# Patient Record
Sex: Female | Born: 1940 | Race: White | Hispanic: No | Smoking: Never smoker
Health system: Southern US, Community
[De-identification: ages and names within clinical notes are randomized; demographics above are authoritative.]

## PROBLEM LIST (undated history)

## (undated) DIAGNOSIS — K219 Gastro-esophageal reflux disease without esophagitis: Secondary | ICD-10-CM

## (undated) DIAGNOSIS — F209 Schizophrenia, unspecified: Secondary | ICD-10-CM

## (undated) DIAGNOSIS — I2510 Atherosclerotic heart disease of native coronary artery without angina pectoris: Secondary | ICD-10-CM

## (undated) DIAGNOSIS — F0390 Unspecified dementia without behavioral disturbance: Secondary | ICD-10-CM

## (undated) DIAGNOSIS — I10 Essential (primary) hypertension: Secondary | ICD-10-CM

## (undated) DIAGNOSIS — E039 Hypothyroidism, unspecified: Secondary | ICD-10-CM

## (undated) DIAGNOSIS — E876 Hypokalemia: Secondary | ICD-10-CM

## (undated) DIAGNOSIS — S72009A Fracture of unspecified part of neck of unspecified femur, initial encounter for closed fracture: Secondary | ICD-10-CM

## (undated) DIAGNOSIS — I4891 Unspecified atrial fibrillation: Secondary | ICD-10-CM

## (undated) DIAGNOSIS — F0280 Dementia in other diseases classified elsewhere without behavioral disturbance: Secondary | ICD-10-CM

## (undated) DIAGNOSIS — E079 Disorder of thyroid, unspecified: Secondary | ICD-10-CM

## (undated) DIAGNOSIS — E119 Type 2 diabetes mellitus without complications: Secondary | ICD-10-CM

## (undated) DIAGNOSIS — G309 Alzheimer's disease, unspecified: Secondary | ICD-10-CM

## (undated) HISTORY — PX: ABDOMINAL HYSTERECTOMY: SHX81

---

## 2004-08-04 ENCOUNTER — Ambulatory Visit: Payer: Self-pay

## 2004-11-11 ENCOUNTER — Ambulatory Visit: Payer: Self-pay | Admitting: Internal Medicine

## 2005-02-11 ENCOUNTER — Ambulatory Visit: Payer: Self-pay | Admitting: Internal Medicine

## 2006-02-15 ENCOUNTER — Ambulatory Visit: Payer: Self-pay | Admitting: Internal Medicine

## 2006-08-09 ENCOUNTER — Ambulatory Visit: Payer: Self-pay | Admitting: Internal Medicine

## 2006-11-09 ENCOUNTER — Ambulatory Visit: Payer: Self-pay | Admitting: Internal Medicine

## 2007-02-17 ENCOUNTER — Ambulatory Visit: Payer: Self-pay | Admitting: Internal Medicine

## 2007-09-02 ENCOUNTER — Ambulatory Visit: Payer: Self-pay | Admitting: Internal Medicine

## 2007-11-24 ENCOUNTER — Ambulatory Visit: Payer: Self-pay | Admitting: Internal Medicine

## 2008-01-05 ENCOUNTER — Ambulatory Visit: Payer: Self-pay | Admitting: Internal Medicine

## 2008-02-20 ENCOUNTER — Ambulatory Visit: Payer: Self-pay | Admitting: Internal Medicine

## 2008-11-14 ENCOUNTER — Ambulatory Visit: Payer: Self-pay | Admitting: Internal Medicine

## 2009-02-21 ENCOUNTER — Ambulatory Visit: Payer: Self-pay | Admitting: Internal Medicine

## 2009-11-22 ENCOUNTER — Ambulatory Visit: Payer: Self-pay | Admitting: Cardiology

## 2010-04-24 ENCOUNTER — Emergency Department: Payer: Self-pay | Admitting: Emergency Medicine

## 2010-06-25 ENCOUNTER — Inpatient Hospital Stay: Payer: Self-pay | Admitting: Psychiatry

## 2010-06-26 ENCOUNTER — Observation Stay: Payer: Self-pay | Admitting: Internal Medicine

## 2010-06-27 ENCOUNTER — Inpatient Hospital Stay: Payer: Self-pay | Admitting: Psychiatry

## 2011-02-09 ENCOUNTER — Ambulatory Visit: Payer: Self-pay | Admitting: Internal Medicine

## 2011-11-26 ENCOUNTER — Ambulatory Visit: Payer: Self-pay | Admitting: Internal Medicine

## 2012-11-29 ENCOUNTER — Ambulatory Visit: Payer: Self-pay | Admitting: Internal Medicine

## 2013-11-30 ENCOUNTER — Ambulatory Visit: Payer: Self-pay | Admitting: Internal Medicine

## 2014-01-30 ENCOUNTER — Ambulatory Visit: Payer: Self-pay | Admitting: Ophthalmology

## 2014-01-30 LAB — POTASSIUM: Potassium: 3.8 mmol/L (ref 3.5–5.1)

## 2014-02-06 ENCOUNTER — Ambulatory Visit: Payer: Self-pay | Admitting: Ophthalmology

## 2014-02-20 ENCOUNTER — Ambulatory Visit: Payer: Self-pay | Admitting: Ophthalmology

## 2014-04-17 ENCOUNTER — Ambulatory Visit: Payer: Self-pay | Admitting: Internal Medicine

## 2014-07-17 ENCOUNTER — Ambulatory Visit: Payer: Self-pay | Admitting: Internal Medicine

## 2014-08-22 ENCOUNTER — Inpatient Hospital Stay: Payer: Self-pay | Admitting: Psychiatry

## 2014-10-27 NOTE — Op Note (Signed)
PATIENT NAME:  [name], Shawneequa M MR#:  [id number] DATE OF BIRTH:  07/14/1940  DATE OF PROCEDURE:  02/06/2014  PREOPERATIVE DIAGNOSIS: Visually significant cataract of the right eye.   POSTOPERATIVE DIAGNOSIS: Visually significant cataract of the right eye.   OPERATIVE PROCEDURE: Cataract extraction by phacoemulsification with implant of intraocular lens to right eye.   SURGEON: Galen ManilaWilliam Crecencio Kwiatek, MD.   ANESTHESIA:  1. Managed anesthesia care.  2. Topical tetracaine drops followed by 2% Xylocaine jelly applied in the preoperative holding area.   COMPLICATIONS: None.   TECHNIQUE:  Stop and chop.   DESCRIPTION OF PROCEDURE: The patient was examined and consented in the preoperative holding area where the aforementioned topical anesthesia was applied to the right eye and then brought back to the Operating Room where the right eye was prepped and draped in the usual sterile ophthalmic fashion and a lid speculum was placed. A paracentesis was created with the side port blade and the anterior chamber was filled with viscoelastic. A near clear corneal incision was performed with the steel keratome. A continuous curvilinear capsulorrhexis was performed with a cystotome followed by the capsulorrhexis forceps. Hydrodissection and hydrodelineation were carried out with BSS on a blunt cannula. The lens was removed in a stop and chop technique and the remaining cortical material was removed with the irrigation-aspiration handpiece. The capsular bag was inflated with viscoelastic and the Tecnis ZCB00 22.5-diopter lens, serial number [id number] was placed in the capsular bag without complication. The remaining viscoelastic was removed from the eye with the irrigation-aspiration handpiece. The wounds were hydrated. The anterior chamber was flushed with Miostat and the eye was inflated to physiologic pressure. Cefuroxime was not placed within the eye due to penicillin allergy and rather a 4:1 dilution of Vigamox was  placed in the eye at the end of the case. The wounds were found to be water tight. The eye was dressed with Vigamox. The patient was given protective glasses to wear throughout the day and a shield with which to sleep tonight. The patient was also given drops with which to begin a drop regimen today and will follow-up with me in one day.    ____________________________ Jerilee FieldWilliam L. Mustafa Potts, MD wlp:jh D: 02/06/2014 22:53:06 ET T: 02/07/2014 05:44:04 ET JOB#: [id number]  cc: Jordie Schreur L. Rook Maue, MD, <Dictator> Jerilee FieldWILLIAM L Triton Heidrich MD ELECTRONICALLY SIGNED 02/07/2014 16:34

## 2014-10-27 NOTE — Op Note (Signed)
PATIENT NAME:  [name], Danielle Boyer MR#:  [id number] DATE OF BIRTH:  01-01-1941  DATE OF PROCEDURE:  02/20/2014  LENS IMPLANT:  Tecnis ZCB00 with 23.0 diopter lens, serial #[id number].   PREOPERATIVE DIAGNOSES:  Visually significant cataract of the left eye.   POSTOPERATIVE DIAGNOSES:  Visually significant cataract of the left eye.   OPERATIVE PROCEDURE:  Cataract extraction by phacoemulsification with implant of intraocular lens to left eye.   SURGEON:  Galen ManilaWilliam Anasofia Micallef, MD.   ANESTHESIA:  1. Managed anesthesia care.  2. Topical tetracaine drops followed by 2% Xylocaine jelly applied in the preoperative holding area.   COMPLICATIONS:  None.   TECHNIQUE:  Stop and chop.   DESCRIPTION OF PROCEDURE:  The patient was examined and consented in the preoperative holding area where the aforementioned topical anesthesia was applied to the left eye and then brought back to the Operating Room where the left eye was prepped and draped in the usual sterile ophthalmic fashion and a lid speculum was placed. A paracentesis was created with the side port blade and the anterior chamber was filled with viscoelastic. A near clear corneal incision was performed with the steel keratome. A continuous curvilinear capsulorrhexis was performed with a cystotome followed by the capsulorrhexis forceps. Hydrodissection and hydrodelineation were carried out with BSS on a blunt cannula. The lens was removed in a stop and chop technique and the remaining cortical material was removed with the irrigation-aspiration handpiece. The capsular bag was inflated with viscoelastic and the Tecnis ZCB00 with 23.0 diopter lens, serial number [id number] was placed in the capsular bag without complication. The remaining viscoelastic was removed from the eye with the irrigation-aspiration handpiece. The wounds were hydrated. The anterior chamber was flushed with Miostat and the eye was inflated to physiologic pressure. Please note that cefuroxime was  not placed within the eye due to PENICILLIN ALLERGY, rather a 4 to 1 dilution of Vigamox was placed in the anterior chamber. The wounds were found to be water tight. The eye was dressed with Vigamox. The patient was given protective glasses to wear throughout the day and a shield with which to sleep tonight. The patient was also given drops with which to begin a drop regimen today and will follow-up with me in one day.    ____________________________ Jerilee FieldWilliam L. Geremiah Fussell, MD wlp:nt D: 02/20/2014 20:31:51 ET T: 02/20/2014 21:58:16 ET JOB#: [id number]  cc: Saidi Santacroce L. Keenon Leitzel, MD, <Dictator> Jerilee FieldWILLIAM L Tangy Drozdowski MD ELECTRONICALLY SIGNED 02/21/2014 8:51

## 2014-11-04 NOTE — H&P (Signed)
PATIENT NAME:  Danielle Boyer, Danielle Boyer MR#:  [id number] DATE OF BIRTH:  [birth year]  DATE OF ADMISSION:  08/22/2014  REFERRING PHYSICIAN: [facility].   ATTENDING PHYSICIAN: Taytem Ghattas B. Jennet Maduro, MD   IDENTIFYING DATA: Danielle Boyer is a 74 year old female with history of psychosis.   CHIEF COMPLAINT: "I hear voices."   HISTORY OF PRESENT ILLNESS: Danielle Boyer was hospitalized at [facility] in December 2011 for a psychotic episode involving auditory hallucinations, paranoia, and bizarre behavior. She was discharged on a combination of Depakote and Haldol. She has been seeing Dr. Jennet Maduro at [facility] since her discharge in 2011. The patient has been stable on an antipsychotic; however, her Depakote had to be discontinued due to severe hair loss. The patient should be taking 5 mg of Haldol twice daily but is no longer on the list of her medications. The patient arrived at my office today with 3 of her adult children complaining of auditory hallucinations. She hears 3 voices in her head, including 1 female voice. She believes that they speak to her from the attic. They run commentary on her behavior, "Danielle Boyer you are now going to bed; what are you doing on the toilet." They do not  commend. They are not threatening or frightening, but the patient has been rather upset about it. In the past, she was accusing her neighbors of making noises. This time around she is just frightened around the house and continuously calls 911. They initially were coming to the house, but they refused to do so any more. Yesterday morning the patient was so frightened that she eventually refused to go back to the house. She came to my office. She agreed to hospitalization. She remembered that her symptoms now are very much similar to the symptoms in 2011. She denies any symptoms of depression. She denies heightened anxiety. That there is no alcohol, illicit substance or  prescription pill misuse involved.   PAST PSYCHIATRIC HISTORY: As above, she has 1 previous hospitalization. She stayed in the hospital for almost 3 weeks for similar symptoms. She was treated with Risperdal, Haldol, Depakote, and Zyprexa. There are no suicide attempts. No history of substance use.   FAMILY PSYCHIATRIC HISTORY: With depression and anxiety. Her sister is on Xanax.   MEDICATIONS: The patient is a patient of Dr. Judithann Sheen and is prescribed aspirin 81 mg daily, Depakote 125 mg twice daily, this was started by Dr. Malvin Johns, a neurologist, Colace 100 mg twice daily, furosemide 20 mg daily, insulin NovoLog 70/30 with 40 units in the morning and 30 at bedtime, Imdur 60 mg daily, Synthroid 50 mcg daily, metformin 1000 mg twice daily, metoprolol 50 mg twice daily, MiraLax 17 grams as needed for constipation, Valtrex 500 mg daily, Ranexa 500 mg twice daily, pantoprazole 40 mg daily, she should be taking Haldol 5 mg twice daily as well.   SOCIAL HISTORY: She is widowed. She lives by herself with a dog. She believes that the dog is the only other being that is able to hear the voices as reportedly the dog barks when the patient hears the voices. She has a very supportive family. Her younger son, Danielle Boyer, who is the healthcare power of attorney, spends weekends with her. She often goes to the house of Danielle Boyer, her older son, but she refuses to stay there for the night even though the family made arrangements for that. She also has a daughter, Danielle Boyer, who has been bringing the patient to all of her  appointments since 2011. She has Medicare and Medicaid. The patient all her life she has been either married or in a relationship and it has been only a couple of years when she does not have a female partner in the house. She still drives a car and visits her son's bakery in [place] almost daily.   REVIEW OF SYSTEMS: CONSTITUTIONAL: No fevers or chills. No weight changes.  EYES: No double or blurred vision.  ENT: No  hearing loss.  RESPIRATORY: No shortness of breath or cough.  CARDIOVASCULAR: No chest pain or orthopnea.  GASTROINTESTINAL: No abdominal pain, nausea, vomiting, or diarrhea.  GENITOURINARY: No incontinence or frequency.  ENDOCRINE: No heat or cold intolerance.  LYMPHATIC: No anemia or easy bruising.  INTEGUMENTARY: No acne or rash.  MUSCULOSKELETAL: No muscle or joint pain.  NEUROLOGIC: No tingling or weakness.  PSYCHIATRIC: See history of present illness for details.   PHYSICAL EXAMINATION: VITAL SIGNS: Blood pressure 138/84, pulse 76, respirations 20, temperature 97.7.  GENERAL: This is a well-developed elderly female in no acute distress.  HEENT: The pupils are equal, round, and reactive to light. Sclerae anicteric.  NECK: Supple. No thyromegaly.  LUNGS: Clear to auscultation. No dullness to percussion.  HEART: Regular rhythm and rate. No murmurs, rubs, or gallops.  ABDOMEN: Soft, nontender, nondistended. Positive bowel sounds.  MUSCULOSKELETAL: Normal muscle strength in all extremities.  SKIN: No rashes or bruises.  LYMPHATIC: No cervical adenopathy.  NEUROLOGIC: Cranial nerves II through XII are intact.   LABORATORY DATA: Chemistries are within normal limits. A lipid profile is within normal limits except for low HDL of 39. Hemoglobin A1c 6.5. Liver function tests within normal limits. TSH 2.61. Urine toxicology screen negative for substances. CBC within normal limits. Urinalysis: Leukocyte esterase 1+ with 8 cells per field.   MENTAL STATUS EXAMINATION ON ADMISSION: The patient is alert and oriented to person, place, time, and situation. She is pleasant, polite, and cooperative. She recognizes me. She maintains good eye contact. She is very animated. She is short of hearing but is always talkative with slightly pressured and loud speech. Her mood is fine with exuberant affect. Thought process is slightly disorganized with racing thoughts. She denies thoughts of hurting herself or  others. She is paranoid and delusional. She hallucinates. Her cognition is grossly intact. Registration, recall, short and long-term memory seem intact. She is of average intelligence and fund of knowledge. Her insight and judgment are limited.   SUICIDE RISK ASSESSMENT ON ADMISSION: This is a patient with a history of psychosis who returns to the hospital floridly psychotic, disorganized, paranoid, delusional, and hallucinating, possibly in the context of medication nonadherence.   INITIAL DIAGNOSES:  AXIS I: Bipolar 1 disorder mania with psychosis.  AXIS II: Deferred.  AXIS III: Hypertension, coronary artery disease, diabetes, hypothyroidism, gastroesophageal reflux disease, constipation, herpes.   PLAN: The patient was admitted to [facility] for safety, stabilization, and medication management.  1.  Psychosis. We started Zyprexa for psychosis. She was prescribed low dose Depakote by Dr. Malvin JohnsPotter, neurology.  Apparently, Dr. Judithann SheenSparks referred Danielle MemosEdna to Dr. Malvin JohnsPotter to rule out any physical causes of her altered mental status. She had a head CT scan that was negative, an EEG, the report is still pending. We will continue Depakote, even though the patient did experience severe hair loss from it.  2.  Insomnia. We will start Ambien 5 mg at bedtime.  3.  Medical. We will continue metformin and insulin with blood glucose  monitoring for diabetes. 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 We will continue Zocor for dyslipidemia. Her lipid profile is good except for elevated HDL. We will continue Ranexa, metoprolol, Imdur, and furosemide, along with aspirin for coronary artery disease and hypertension. We will continue Synthroid for hypertension and Colace for constipation.  4.  Social. The family does not feel that the patient is unsafe living alone. She can move in with her children at any time if she chooses to. Placement in a nursing home is not an option.    DISPOSITION: The patient will be discharged with family. She will follow up with Dr. Jennet Maduro.    ____________________________ Ellin Goodie. Jennet Maduro, MD jbp:at D: 08/23/2014 13:43:15 ET T: 08/23/2014 14:17:57 ET JOB#: [id number]  cc: Desi Carby B. Jennet Maduro, MD, <Dictator> Shari Prows MD ELECTRONICALLY SIGNED 09/17/2014 7:24

## 2015-03-18 ENCOUNTER — Emergency Department
Admission: EM | Admit: 2015-03-18 | Discharge: 2015-03-19 | Disposition: A | Discharge status: Other Acute Inpatient Hospital | Payer: Medicare Other | Attending: Emergency Medicine | Admitting: Emergency Medicine

## 2015-03-18 ENCOUNTER — Encounter: Payer: Self-pay | Admitting: Emergency Medicine

## 2015-03-18 ENCOUNTER — Emergency Department: Payer: Medicare Other

## 2015-03-18 DIAGNOSIS — F0390 Unspecified dementia without behavioral disturbance: Secondary | ICD-10-CM | POA: Insufficient documentation

## 2015-03-18 DIAGNOSIS — F312 Bipolar disorder, current episode manic severe with psychotic features: Secondary | ICD-10-CM | POA: Diagnosis not present

## 2015-03-18 DIAGNOSIS — F302 Manic episode, severe with psychotic symptoms: Secondary | ICD-10-CM

## 2015-03-18 DIAGNOSIS — I10 Essential (primary) hypertension: Secondary | ICD-10-CM | POA: Insufficient documentation

## 2015-03-18 DIAGNOSIS — R440 Auditory hallucinations: Secondary | ICD-10-CM | POA: Diagnosis present

## 2015-03-18 DIAGNOSIS — E119 Type 2 diabetes mellitus without complications: Secondary | ICD-10-CM

## 2015-03-18 DIAGNOSIS — Z880 Allergy status to penicillin: Secondary | ICD-10-CM | POA: Insufficient documentation

## 2015-03-18 DIAGNOSIS — F03A0 Unspecified dementia, mild, without behavioral disturbance, psychotic disturbance, mood disturbance, and anxiety: Secondary | ICD-10-CM

## 2015-03-18 HISTORY — DX: Type 2 diabetes mellitus without complications: E11.9

## 2015-03-18 HISTORY — DX: Essential (primary) hypertension: I10

## 2015-03-18 LAB — URINE DRUG SCREEN, QUALITATIVE (ARMC ONLY)
AMPHETAMINES, UR SCREEN: NOT DETECTED
BARBITURATES, UR SCREEN: NOT DETECTED
BENZODIAZEPINE, UR SCRN: NOT DETECTED
Cannabinoid 50 Ng, Ur ~~LOC~~: NOT DETECTED
Cocaine Metabolite,Ur ~~LOC~~: NOT DETECTED
MDMA (Ecstasy)Ur Screen: NOT DETECTED
METHADONE SCREEN, URINE: NOT DETECTED
OPIATE, UR SCREEN: NOT DETECTED
PHENCYCLIDINE (PCP) UR S: NOT DETECTED
Tricyclic, Ur Screen: NOT DETECTED

## 2015-03-18 LAB — COMPREHENSIVE METABOLIC PANEL
ALBUMIN: 4 g/dL (ref 3.5–5.0)
ALT: 13 U/L — ABNORMAL LOW (ref 14–54)
AST: 25 U/L (ref 15–41)
Alkaline Phosphatase: 58 U/L (ref 38–126)
Anion gap: 9 (ref 5–15)
BILIRUBIN TOTAL: 0.7 mg/dL (ref 0.3–1.2)
BUN: 9 mg/dL (ref 6–20)
CALCIUM: 9 mg/dL (ref 8.9–10.3)
CO2: 26 mmol/L (ref 22–32)
Chloride: 107 mmol/L (ref 101–111)
Creatinine, Ser: 0.72 mg/dL (ref 0.44–1.00)
GFR calc Af Amer: >60 mL/min (ref >60)
GLUCOSE: 82 mg/dL (ref 65–99)
POTASSIUM: 3.7 mmol/L (ref 3.5–5.1)
Sodium: 142 mmol/L (ref 135–145)
TOTAL PROTEIN: 7.2 g/dL (ref 6.5–8.1)

## 2015-03-18 LAB — TSH: TSH: 9.559 u[IU]/mL — AB (ref 0.350–4.500)

## 2015-03-18 LAB — CBC WITH DIFFERENTIAL/PLATELET
BASOS ABS: 0 10*3/uL (ref 0–0.1)
BASOS PCT: 0 %
EOS ABS: 0.1 10*3/uL (ref 0–0.7)
Eosinophils Relative: 1 %
HEMATOCRIT: 36 % (ref 35.0–47.0)
HEMOGLOBIN: 11.6 g/dL — AB (ref 12.0–16.0)
Lymphocytes Relative: 26 %
Lymphs Abs: 2.5 10*3/uL (ref 1.0–3.6)
MCH: 28.5 pg (ref 26.0–34.0)
MCHC: 32.2 g/dL (ref 32.0–36.0)
MCV: 88.7 fL (ref 80.0–100.0)
Monocytes Absolute: 0.8 10*3/uL (ref 0.2–0.9)
Monocytes Relative: 8 %
NEUTROS ABS: 6.1 10*3/uL (ref 1.4–6.5)
NEUTROS PCT: 65 %
Platelets: 300 10*3/uL (ref 150–440)
RBC: 4.05 MIL/uL (ref 3.80–5.20)
RDW: 14.8 % — ABNORMAL HIGH (ref 11.5–14.5)
WBC: 9.4 10*3/uL (ref 3.6–11.0)

## 2015-03-18 LAB — URINALYSIS COMPLETE WITH MICROSCOPIC (ARMC ONLY)
BACTERIA UA: NONE SEEN
Bilirubin Urine: NEGATIVE
Glucose, UA: NEGATIVE mg/dL
HGB URINE DIPSTICK: NEGATIVE
KETONES UR: NEGATIVE mg/dL
LEUKOCYTES UA: NEGATIVE
NITRITE: NEGATIVE
PROTEIN: NEGATIVE mg/dL
SPECIFIC GRAVITY, URINE: 1.009 (ref 1.005–1.030)
pH: 5 (ref 5.0–8.0)

## 2015-03-18 LAB — TROPONIN I: Troponin I: <0.03 ng/mL (ref <0.031)

## 2015-03-18 LAB — ETHANOL

## 2015-03-18 MED ORDER — OLANZAPINE 5 MG PO TABS
2.5000 mg | ORAL_TABLET | Freq: Every day | ORAL | Status: DC
Start: 2015-03-18 — End: 2015-03-20
  Administered 2015-03-18 – 2015-03-19 (×2): 2.5 mg via ORAL
  Filled 2015-03-18 (×2): qty 0.5
  Filled 2015-03-18: qty 1

## 2015-03-18 NOTE — ED Notes (Addendum)
Son states she has been having hallucinations. Recently started trazadone but son states this started before that.  Skin w/d. Pt states "im as nervous as a cat"

## 2015-03-18 NOTE — ED Provider Notes (Signed)
[facility] Emergency Department Provider Note  ____________________________________________  Time seen: 2345  I have reviewed the triage vital signs and the nursing notes.   HISTORY  Chief Complaint Hallucinations     HPI Danielle Boyer is a 74 y.o. female  who presents to the emergency department because she says she hears voices and has been for a couple of weeks.  The timeline is a little unclear. The patient reports that she was prescribed trazodone 2-3 weeks ago. She says that this medicine did not feel comfortable for her and she stopped taking it approximately a week ago. She tells me that the voices she hears began after she started taking trazodone. The voices have continued, including today. Apparently her son reports that some of her symptoms began before she started taking trazodone.  She denies that the voices are speaking to her. She says the voices sound as though it's a large crowd of people. She denies that they're making any threatening comments, but apparently she hears them making inappropriate increased comments.  She went to go get the mail from her mailbox when the postman arrives today. She asked the postman if she heard the voices she was hearing. He did not.  The patient does report that she has seen a psychiatrist before. Some of the background information on this is unclear at this time.   Past Medical History  Diagnosis Date  . Diabetes mellitus without complication   . Hypertension     There are no active problems to display for this patient.   Past Surgical History  Procedure Laterality Date  . Abdominal hysterectomy    . Cesarean section      x3    No current outpatient prescriptions on file.  Allergies Penicillins  History reviewed. No pertinent family history.  Social History Social History  Substance Use Topics  . Smoking status: Never Smoker   . Smokeless tobacco: None  . Alcohol Use: No    Review of  Systems  Constitutional: Negative for fever. ENT: Negative for sore throat. Cardiovascular: Positive for some "pain around the breast" Respiratory: Negative for shortness of breath. Gastrointestinal: Negative for abdominal pain, vomiting and diarrhea. Genitourinary: Negative for dysuria. Musculoskeletal: No myalgias or injuries. Skin: Negative for rash. Neurological: Negative for headaches Psychological: Auditory hallucinations. No thoughts of self-harm. See history of present illness  10-point ROS otherwise negative.  ____________________________________________   PHYSICAL EXAM:  VITAL SIGNS: ED Triage Vitals  Enc Vitals Group     BP 03/18/15 1740 189/65 mmHg     Pulse Rate 03/18/15 1740 96     Resp 03/18/15 1740 20     Temp 03/18/15 1740 98.2 F (36.8 C)     Temp Source 03/18/15 1740 Oral     SpO2 03/18/15 1740 97 %     Weight 03/18/15 1740 156 lb (70.761 kg)     Height 03/18/15 1740 5\' 3"  (1.6 m)     Head Cir --      Peak Flow --      Pain Score --      Pain Loc --      Pain Edu? --      Excl. in GC? --     Constitutional:  Alert, pleasant, no distress. ENT   Head: Normocephalic and atraumatic.   Nose: No congestion/rhinnorhea. Cardiovascular: Normal rate, regular rhythm, no murmur noted Respiratory:  Normal respiratory effort, no tachypnea.    Breath sounds are clear and equal bilaterally.  Gastrointestinal:  Soft and nontender. No distention.  Back: No muscle spasm, no tenderness, no CVA tenderness. Musculoskeletal: No deformity noted. Nontender with normal range of motion in all extremities.  No noted edema. Neurologic:  Normal speech and language. No gross focal neurologic deficits are appreciated.  Skin:  Skin is warm, dry. No rash noted. Psychiatric: Pleasant interaction. Patient denies thoughts of self-harm or harm towards others. She does report hearing voices that are not speaking to her. Some of the voices are making inappropriate or croup  comments. ____________________________________________    LABS (pertinent positives/negatives)  Labs Reviewed  COMPREHENSIVE METABOLIC PANEL  ETHANOL  CBC WITH DIFFERENTIAL/PLATELET  URINE RAPID DRUG SCREEN, HOSP PERFORMED  URINALYSIS COMPLETEWITH MICROSCOPIC ([facility] ONLY)  TSH  TROPONIN I     ____________________________________________   EKG  ED ECG REPORT I, Jemario Poitras W, the attending physician, personally viewed and interpreted this ECG.   Date: 03/18/2015  EKG Time: 2152  Rate: 74  Rhythm: Normal sinus rhythm  Axis: Normal  Intervals: Normal  ST&T Change: None noted   ____________________________________________    RADIOLOGY   ____________________________________________   PROCEDURES  Consult, psychiatry:   ____________________________________________   INITIAL IMPRESSION / ASSESSMENT AND PLAN / ED COURSE  Pertinent labs & imaging results that were available during my care of the patient were reviewed by me and considered in my medical decision making (see chart for details).  Pleasant alert 74 year old female in no acute distress. She is interactive and overall appropriate. She reports that she is hearing these voices that are mentioned above. She does not appear to be a threat to herself or to others.   I have called and spoken with her son. He reports that there is more abnormal behavior than what is reported by the patient. She becomes anxious at times and goes to the window thinking that there are people were cars approaching. He provides additional history of the patient's prior psychiatric hospitalizations. We have also looked at the history in the medical record from Dr. Jennet Maduro.  At this time, we will hold the patient in the emergency department pending psychiatric evaluation. ____________________________________________   FINAL CLINICAL IMPRESSION(S) / ED DIAGNOSES  Final diagnoses:  Auditory hallucinations      Darien Ramus, MD 03/19/15 [phone]

## 2015-03-19 DIAGNOSIS — F302 Manic episode, severe with psychotic symptoms: Secondary | ICD-10-CM

## 2015-03-19 DIAGNOSIS — E119 Type 2 diabetes mellitus without complications: Secondary | ICD-10-CM

## 2015-03-19 LAB — GLUCOSE, CAPILLARY
GLUCOSE-CAPILLARY: 133 mg/dL — AB (ref 65–99)
GLUCOSE-CAPILLARY: 160 mg/dL — AB (ref 65–99)
Glucose-Capillary: 219 mg/dL — ABNORMAL HIGH (ref 65–99)
Glucose-Capillary: 117 mg/dL — ABNORMAL HIGH (ref 65–99)
Glucose-Capillary: 171 mg/dL — ABNORMAL HIGH (ref 65–99)

## 2015-03-19 MED ORDER — LEVOTHYROXINE SODIUM 75 MCG PO TABS
75.0000 ug | ORAL_TABLET | Freq: Every day | ORAL | Status: DC
  Administered 2015-03-19: 75 ug via ORAL
  Filled 2015-03-19 (×2): qty 1

## 2015-03-19 MED ORDER — HALOPERIDOL 0.5 MG PO TABS
0.5000 mg | ORAL_TABLET | Freq: Two times a day (BID) | ORAL | Status: DC
  Administered 2015-03-19 (×2): 0.5 mg via ORAL
  Filled 2015-03-19 (×2): qty 1

## 2015-03-19 MED ORDER — INSULIN ASPART 100 UNIT/ML ~~LOC~~ SOLN
0.0000 [IU] | SUBCUTANEOUS | Status: DC
  Administered 2015-03-19: 2 [IU] via SUBCUTANEOUS
  Administered 2015-03-19: 4 [IU] via SUBCUTANEOUS
  Filled 2015-03-19: qty 4
  Filled 2015-03-19: qty 2

## 2015-03-19 MED ORDER — TRAZODONE HCL 100 MG PO TABS
100.0000 mg | ORAL_TABLET | Freq: Every day | ORAL | Status: DC
  Administered 2015-03-19: 100 mg via ORAL
  Filled 2015-03-19: qty 1

## 2015-03-19 MED ORDER — FUROSEMIDE 40 MG PO TABS
20.0000 mg | ORAL_TABLET | Freq: Every day | ORAL | Status: DC
  Administered 2015-03-19: 20 mg via ORAL
  Filled 2015-03-19: qty 2

## 2015-03-19 MED ORDER — RANOLAZINE ER 500 MG PO TB12
500.0000 mg | ORAL_TABLET | Freq: Two times a day (BID) | ORAL | Status: DC
  Administered 2015-03-19 (×2): 500 mg via ORAL
  Filled 2015-03-19 (×3): qty 1

## 2015-03-19 MED ORDER — INSULIN ASPART PROT & ASPART (70-30 MIX) 100 UNIT/ML ~~LOC~~ SUSP: 20.0000 [IU] | Freq: Every day | SUBCUTANEOUS | Status: DC

## 2015-03-19 MED ORDER — PANTOPRAZOLE SODIUM 40 MG PO TBEC
80.0000 mg | DELAYED_RELEASE_TABLET | Freq: Every day | ORAL | Status: DC
  Administered 2015-03-19: 80 mg via ORAL
  Filled 2015-03-19: qty 2

## 2015-03-19 MED ORDER — OLANZAPINE 10 MG PO TABS
10.0000 mg | ORAL_TABLET | Freq: Every day | ORAL | Status: DC
  Administered 2015-03-19: 10 mg via ORAL
  Filled 2015-03-19: qty 1

## 2015-03-19 MED ORDER — POTASSIUM CHLORIDE CRYS ER 20 MEQ PO TBCR
20.0000 meq | EXTENDED_RELEASE_TABLET | Freq: Every day | ORAL | Status: DC
  Administered 2015-03-19: 20 meq via ORAL
  Filled 2015-03-19: qty 1

## 2015-03-19 MED ORDER — METOPROLOL TARTRATE 50 MG PO TABS
50.0000 mg | ORAL_TABLET | Freq: Two times a day (BID) | ORAL | Status: DC
  Administered 2015-03-19 (×2): 50 mg via ORAL
  Filled 2015-03-19 (×2): qty 1

## 2015-03-19 MED ORDER — INSULIN ASPART PROT & ASPART (70-30 MIX) 100 UNIT/ML ~~LOC~~ SUSP
40.0000 [IU] | Freq: Every day | SUBCUTANEOUS | Status: DC
  Filled 2015-03-19: qty 40

## 2015-03-19 MED ORDER — METFORMIN HCL 500 MG PO TABS
1000.0000 mg | ORAL_TABLET | Freq: Two times a day (BID) | ORAL | Status: DC
  Administered 2015-03-19 (×2): 1000 mg via ORAL
  Filled 2015-03-19 (×2): qty 2

## 2015-03-19 MED ORDER — INSULIN ASPART PROT & ASPART (70-30 MIX) 100 UNIT/ML ~~LOC~~ SUSP: 20.0000 [IU] | Freq: Two times a day (BID) | SUBCUTANEOUS | Status: DC

## 2015-03-19 NOTE — ED Notes (Signed)
BEHAVIORAL HEALTH ROUNDING Patient sleeping: No. Patient alert and oriented: yes Behavior appropriate: Yes.  ; If no, describe:  Nutrition and fluids offered: Yes  Toileting and hygiene offered: Yes  Sitter present: yes Law enforcement present: Yes  

## 2015-03-19 NOTE — ED Notes (Signed)
BEHAVIORAL HEALTH ROUNDING Patient sleeping: No. Patient alert and oriented: yes Behavior appropriate: Yes.  ; If no, describe:  Nutrition and fluids offered: Yes  Toileting and hygiene offered: Yes  Sitter present: no Law enforcement present: Yes  

## 2015-03-19 NOTE — BHH Counselor (Signed)
Writer called and updated [facility] ([phone]), patient is still awaiting transportation to arrive to their facility. They state that it was okay and request a call when she is in route.

## 2015-03-19 NOTE — ED Notes (Signed)
BEHAVIORAL HEALTH ROUNDING Patient sleeping: No. Patient alert and oriented: yes Behavior appropriate: Yes.  ;  Nutrition and fluids offered: Yes  Toileting and hygiene offered: Yes  Sitter present: no Law enforcement present: Yes   

## 2015-03-19 NOTE — ED Notes (Signed)
BEHAVIORAL HEALTH ROUNDING Patient sleeping: Yes.   Patient alert and oriented: yes Behavior appropriate: Yes.  ; If no, describe:  Nutrition and fluids offered: sleeping Toileting and hygiene offered: sleeping Sitter present: yes Law enforcement present: Yes  

## 2015-03-19 NOTE — BH Assessment (Signed)
Assessment Note  Danielle Boyer is an 74 y.o. female. Danielle Boyer reports that she came to the ED because "I got tired of hearing things".  She stated that she thought someone was making noises just to be smart, but she asked the mail man if he heard what she heard, and he did not and she began to worry.  In addition, she reports that she has been feeling ill and dizzy.  She states that she worries that she will get dizzy and fall down the stairs or fall and get hurt.  She denied symptoms of depression or anxiety.  She reports concerns about hearing things.  She denied visual hallucinations.  She denied suicidal or homicidal ideation or intent.  She is seen by Dr. Harold Hedge,  Family members report that Danielle Boyer exhibits additional symptoms.  She is reported as being anxious and concerned that individuals are watching her through the windows.  She further exhibits bizarre behaviors that her family did not feel comfortable for her to be safe with.  Axis I: Psychotic Disorder NOS Axis II: Deferred Axis III:  Past Medical History  Diagnosis Date  . Diabetes mellitus without complication   . Hypertension    Axis IV: other psychosocial or environmental problems Axis V: 31-40 impairment in reality testing  Past Medical History:  Past Medical History  Diagnosis Date  . Diabetes mellitus without complication   . Hypertension     Past Surgical History  Procedure Laterality Date  . Abdominal hysterectomy    . Cesarean section      x3    Family History: History reviewed. No pertinent family history.  Social History:  reports that she has never smoked. She does not have any smokeless tobacco history on file. She reports that she does not drink alcohol. Her drug history is not on file.  Additional Social History:  Alcohol / Drug Use History of alcohol / drug use?: No history of alcohol / drug abuse  CIWA: CIWA-Ar BP: (!) 189/65 mmHg Pulse Rate: 96 COWS:    Allergies:  Allergies  Allergen  Reactions  . Citalopram Other (See Comments)    Reaction:  Unknown   . Penicillins Rash    Home Medications:  (Not in a hospital admission)  OB/GYN Status:  No LMP recorded. Patient has had a hysterectomy.  General Assessment Data Location of Assessment: [facility] ED TTS Assessment: In system Is this a Tele or Face-to-Face Assessment?: Face-to-Face Is this an Initial Assessment or a Re-assessment for this encounter?: Initial Assessment Marital status: Widowed Is patient pregnant?: No Pregnancy Status: No Living Arrangements: Children Can pt return to current living arrangement?: Yes Admission Status: Voluntary Is patient capable of signing voluntary admission?: Yes Referral Source: Self/Family/Friend Insurance type: [place] [employer]  Medical Screening Exam [facility] Walk-in ONLY) Medical Exam completed: Yes  Crisis Care Plan Living Arrangements: Children Name of Psychiatrist: None Name of Therapist: None  Education Status Is patient currently in school?: No Current Grade: na/ Highest grade of school patient has completed: n/a Name of school: n/a Contact person: n/a  Risk to self with the past 6 months Suicidal Ideation: No Has patient been a risk to self within the past 6 months prior to admission? : No Suicidal Intent: No Has patient had any suicidal intent within the past 6 months prior to admission? : No Is patient at risk for suicide?: No Suicidal Plan?: No Has patient had any suicidal plan within the past 6 months prior to admission? :  No Access to Means: No What has been your use of drugs/alcohol within the last 12 months?: None reported Previous Attempts/Gestures: No How many times?: 0 Other Self Harm Risks: None reported Triggers for Past Attempts: None known Intentional Self Injurious Behavior: None Family Suicide History: No Recent stressful life event(s):  (none reported) Persecutory voices/beliefs?: No Depression: No Depression Symptoms:  (None  reported) Substance abuse history and/or treatment for substance abuse?: No Suicide prevention information given to non-admitted patients: Not applicable  Risk to Others within the past 6 months Homicidal Ideation: No Does patient have any lifetime risk of violence toward others beyond the six months prior to admission? : No Thoughts of Harm to Others: No Current Homicidal Intent: No Current Homicidal Plan: No Access to Homicidal Means: No Identified Victim: None reported History of harm to others?: No Assessment of Violence: None Noted Violent Behavior Description: None reported Does patient have access to weapons?: No Criminal Charges Pending?: No Does patient have a court date: No Is patient on probation?: No  Psychosis Hallucinations: Auditory, Visual Delusions: None noted  Mental Status Report Appearance/Hygiene: In scrubs Eye Contact: Good Motor Activity: Unremarkable Speech: Loud Level of Consciousness: Alert Mood: Pleasant Affect: Appropriate to circumstance Anxiety Level: None Thought Processes: Coherent Judgement: Unimpaired Orientation: Person, Place, Time, Situation Obsessive Compulsive Thoughts/Behaviors: None  Cognitive Functioning Concentration: Normal Memory: Recent Intact IQ: Average Insight: Good Impulse Control: Fair Appetite: Fair Sleep: No Change Vegetative Symptoms: None  ADLScreening [facility] Assessment Services) Patient's cognitive ability adequate to safely complete daily activities?: Yes Patient able to express need for assistance with ADLs?: Yes Independently performs ADLs?: Yes (appropriate for developmental age)  Prior Inpatient Therapy Prior Inpatient Therapy: Yes Prior Therapy Dates: 2016 Prior Therapy Facilty/Provider(s): [facility] Reason for Treatment: Confusion  Prior Outpatient Therapy Prior Outpatient Therapy: Yes Prior Therapy Dates: June 2016 Prior Therapy Facilty/Provider(s): Unsure Does patient have an ACCT team?: No Does  patient have Intensive In-House Services?  : No Does patient have [facility] services? : No Does patient have P4CC services?: No  ADL Screening (condition at time of admission) Patient's cognitive ability adequate to safely complete daily activities?: Yes Patient able to express need for assistance with ADLs?: Yes Independently performs ADLs?: Yes (appropriate for developmental age)       Abuse/Neglect Assessment (Assessment to be complete while patient is alone) Physical Abuse: Denies Verbal Abuse: Denies Sexual Abuse: Denies Exploitation of patient/patient's resources: Denies Self-Neglect: Denies Values / Beliefs Cultural Requests During Hospitalization: None Spiritual Requests During Hospitalization: None   Advance Directives (For Healthcare) Does patient have an advance directive?: No Would patient like information on creating an advanced directive?: Yes - Educational materials given    Additional Information 1:1 In Past 12 Months?: No CIRT Risk: No Elopement Risk: No Does patient have medical clearance?: Yes     Disposition:  Disposition Initial Assessment Completed for this Encounter: Yes Disposition of Patient: Referred to (to be seen by the psychiatrist)  On Site Evaluation by:   Reviewed with Physician:    Justice Deeds 03/19/2015 12:52 AM

## 2015-03-19 NOTE — BHH Counselor (Addendum)
Pt. has been accepted to [facility]. Accepting physician is Dr. Gearldine Bienenstock. Call report to [phone]. Representative was [employer]. ER Staff Vickie Epley, ER Sect.; Dr. Darnelle Catalan, ER MD & Carollee Herter Patient's Nurse) have been made aware it.  Pt.'s Family/Support System [phone]) have been updated as well.  Received a phone call from patient daughter ([phone]), stating she went by the other son Gerlene Burdock) house, to inform him about the plans of the patient going to [facility] but he wasn't home. He was at work. She stated, she will keep him updated. Patient son, doesn't have a cell phone.

## 2015-03-19 NOTE — ED Provider Notes (Signed)
-----------------------------------------   7:35 PM on 03/19/2015 -----------------------------------------   Blood pressure 147/63, pulse 94, temperature 98 F (36.7 C), temperature source Oral, resp. rate 20, height  (1.6 m), weight 156 lb (70.761 kg), SpO2 96 %.  Transferred to [facility].  Hemodynamically stable.  Loleta Rose, MD 03/19/15 [phone]

## 2015-03-19 NOTE — ED Notes (Signed)
Family visiting pt at this time.

## 2015-03-19 NOTE — ED Notes (Signed)
ED BHU PLACEMENT JUSTIFICATION Is the patient under IVC or is there intent for IVC: Yes.   Is the patient medically cleared: Yes.   Is there vacancy in the ED BHU: Yes.   Is the population mix appropriate for patient: No. Is the patient awaiting placement in inpatient or outpatient setting: Yes.   Has the patient had a psychiatric consult: Yes.   Survey of unit performed for contraband, proper placement and condition of furniture, tampering with fixtures in bathroom, shower, and each patient room: Yes.  ; Findings: all clear APPEARANCE/BEHAVIOR calm, cooperative and adequate rapport can be established NEURO ASSESSMENT Orientation: time, place and person Hallucinations: Yes.  Auditory Hallucinations Speech: Normal Gait: normal RESPIRATORY ASSESSMENT Normal expansion.  Clear to auscultation.  No rales, rhonchi, or wheezing. CARDIOVASCULAR ASSESSMENT regular rate and rhythm, S1, S2 normal, no murmur, click, rub or gallop GASTROINTESTINAL ASSESSMENT soft, nontender, BS WNL, no r/g EXTREMITIES normal strength, tone, and muscle mass, ROM of all joints is normal PLAN OF CARE Provide calm/safe environment. Vital signs assessed twice daily. ED BHU Assessment once each 12-hour shift. Collaborate with intake RN daily or as condition indicates. Assure the ED provider has rounded once each shift. Provide and encourage hygiene. Provide redirection as needed. Assess for escalating behavior; address immediately and inform ED provider.  Assess family dynamic and appropriateness for visitation as needed: Yes.  ; If necessary, describe findings:  Educate the patient/family about BHU procedures/visitation: Yes.  ; If necessary, describe findings:

## 2015-03-19 NOTE — ED Notes (Signed)
BEHAVIORAL HEALTH ROUNDING Patient sleeping: Yes.   Patient alert and oriented: yes Behavior appropriate: Yes.  ; If no, describe:  Nutrition and fluids offered: Yes  Toileting and hygiene offered: Yes  Sitter present: yes Law enforcement present: Yes  

## 2015-03-19 NOTE — ED Provider Notes (Signed)
-----------------------------------------   7:22 AM on 03/19/2015 -----------------------------------------   Blood pressure 189/65, pulse 96, temperature 98.2 F (36.8 C), temperature source Oral, resp. rate 20, height  (1.6 m), weight 156 lb (70.761 kg), SpO2 97 %.  The patient had no acute events since last update.  Calm and cooperative at this time.  Disposition is pending per Psychiatry/Behavioral Medicine team recommendations. Will watch blood pressure carefully is elevated today     Arnaldo Natal, MD 03/19/15 [phone]

## 2015-03-19 NOTE — Consult Note (Signed)
[facility] Face-to-Face Psychiatry Consult   Reason for Consult:  74 year old woman with a history of bipolar disorder with psychosis who comes to the hospital because of return of hallucinations Referring Physician:  Karma Greaser Patient Identification: Danielle Boyer MRN:  [id number] Principal Diagnosis: Bipolar I disorder, single manic episode, severe, with psychosis Diagnosis:   Patient Active Problem List   Diagnosis Date Noted  . Bipolar I disorder, single manic episode, severe, with psychosis [F30.2] 03/19/2015  . Diabetes [E11.9] 03/19/2015  . 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 female patient admitted with "I was hearing something and I thought I should get some help".  HPI:  Patient reports that over the last week or so she's been having worsening auditory hallucinations. She heard a sound coming from behind her house that sounded like shouting. She developed a delusion that someone was back there trying to hurt her. She ask her mailman neighbors but they reassured her that it wasn't there. She says that she's been depressed recently. Sleeping very poorly. She has been compliant she says with her medicine. Does not report any new psychosocial stress. Denies any suicidal thoughts denies homicidal ideation.  Past psychiatric history: History of bipolar disorder with psychosis. 2 prior hospitalizations here. Has been followed by Dr. Mamie Nick as an outpatient. Was responding well to mood stabilizers and Zyprexa. No history of suicide attempts. Does get somewhat agitated when she is psychotic  Social history: Patient lives by herself with her dog. She has a son who checks on her once a week. She still drives. Patient is 4 times a widow.  Medical history: Patient has diabetes requiring insulin but seems to keep it under pretty good control. High blood pressure. Gastric reflux.  Family history: None reported  Substance abuse  history: Does not drink or abuse drugs no history of substance abuse issues. HPI Elements:   Quality:  Psychosis and hallucinations. Severity:  Severe in getting worse. Timing:  Getting bad for the last week. Duration:  Ongoing. Context:  Chronic recurrent illness.  Past Medical History:  Past Medical History  Diagnosis Date  . Diabetes mellitus without complication   . Hypertension     Past Surgical History  Procedure Laterality Date  . Abdominal hysterectomy    . Cesarean section      x3   Family History: History reviewed. No pertinent family history. Social History:  History  Alcohol Use No     History  Drug Use Not on file    Social History   Social History  . Marital Status: Widowed    Spouse Name: N/A  . Number of Children: N/A  . Years of Education: N/A   Social History Main Topics  . Smoking status: Never Smoker   . Smokeless tobacco: None  . Alcohol Use: No  . Drug Use: None  . Sexual Activity: Not Asked   Other Topics Concern  . None   Social History Narrative  . None   Additional Social History:    History of alcohol / drug use?: No history of alcohol / drug abuse                     Allergies:   Allergies  Allergen Reactions  . Citalopram Other (See Comments)    Reaction:  Unknown   . Penicillins Rash    Labs:  Results for orders placed or performed  during the hospital encounter of 03/18/15 (from the past 48 hour(s))  Comprehensive metabolic panel     Status: Abnormal   Collection Time: 03/18/15  9:58 PM  Result Value Ref Range   Sodium 142 135 - 145 mmol/L   Potassium 3.7 3.5 - 5.1 mmol/L   Chloride 107 101 - 111 mmol/L   CO2 26 22 - 32 mmol/L   Glucose, Bld 82 65 - 99 mg/dL   BUN 9 6 - 20 mg/dL   Creatinine, Ser 0.72 0.44 - 1.00 mg/dL   Calcium 9.0 8.9 - 10.3 mg/dL   Total Protein 7.2 6.5 - 8.1 g/dL   Albumin 4.0 3.5 - 5.0 g/dL   AST 25 15 - 41 U/L   ALT 13 (L) 14 - 54 U/L   Alkaline Phosphatase 58 38 - 126 U/L    Total Bilirubin 0.7 0.3 - 1.2 mg/dL   GFR calc non Af Amer >60 >60 mL/min   GFR calc Af Amer >60 >60 mL/min    Comment: (NOTE) The eGFR has been calculated using the CKD EPI equation. This calculation has not been validated in all clinical situations. eGFR's persistently <60 mL/min signify possible Chronic Kidney Disease.    Anion gap 9 5 - 15  Ethanol     Status: None   Collection Time: 03/18/15  9:58 PM  Result Value Ref Range   Alcohol, Ethyl (B) <5 <5 mg/dL    Comment:        LOWEST DETECTABLE LIMIT FOR SERUM ALCOHOL IS 5 mg/dL FOR MEDICAL PURPOSES ONLY   CBC with Diff     Status: Abnormal   Collection Time: 03/18/15  9:58 PM  Result Value Ref Range   WBC 9.4 3.6 - 11.0 K/uL   RBC 4.05 3.80 - 5.20 MIL/uL   Hemoglobin 11.6 (L) 12.0 - 16.0 g/dL   HCT 36.0 35.0 - 47.0 %   MCV 88.7 80.0 - 100.0 fL   MCH 28.5 26.0 - 34.0 pg   MCHC 32.2 32.0 - 36.0 g/dL   RDW 14.8 (H) 11.5 - 14.5 %   Platelets 300 150 - 440 K/uL   Neutrophils Relative % 65 %   Neutro Abs 6.1 1.4 - 6.5 K/uL   Lymphocytes Relative 26 %   Lymphs Abs 2.5 1.0 - 3.6 K/uL   Monocytes Relative 8 %   Monocytes Absolute 0.8 0.2 - 0.9 K/uL   Eosinophils Relative 1 %   Eosinophils Absolute 0.1 0 - 0.7 K/uL   Basophils Relative 0 %   Basophils Absolute 0.0 0 - 0.1 K/uL  TSH     Status: Abnormal   Collection Time: 03/18/15  9:58 PM  Result Value Ref Range   TSH 9.559 (H) 0.350 - 4.500 uIU/mL  Troponin I     Status: None   Collection Time: 03/18/15  9:58 PM  Result Value Ref Range   Troponin I <0.03 <0.031 ng/mL    Comment:        NO INDICATION OF MYOCARDIAL INJURY.   Urinalysis complete, with microscopic ([facility] only)     Status: Abnormal   Collection Time: 03/18/15 10:36 PM  Result Value Ref Range   Color, Urine YELLOW (A) YELLOW   APPearance CLEAR (A) CLEAR   Glucose, UA NEGATIVE NEGATIVE mg/dL   Bilirubin Urine NEGATIVE NEGATIVE   Ketones, ur NEGATIVE NEGATIVE mg/dL   Specific Gravity, Urine 1.009 1.005  - 1.030   Hgb urine dipstick NEGATIVE NEGATIVE   pH 5.0 5.0 - 8.0  Protein, ur NEGATIVE NEGATIVE mg/dL   Nitrite NEGATIVE NEGATIVE   Leukocytes, UA NEGATIVE NEGATIVE   RBC / HPF 0-5 0 - 5 RBC/hpf   WBC, UA 0-5 0 - 5 WBC/hpf   Bacteria, UA NONE SEEN NONE SEEN   Squamous Epithelial / LPF 0-5 (A) NONE SEEN   Mucous PRESENT   Urine Drug Screen, Qualitative ([facility] only)     Status: None   Collection Time: 03/18/15 10:36 PM  Result Value Ref Range   Tricyclic, Ur Screen NONE DETECTED NONE DETECTED   Amphetamines, Ur Screen NONE DETECTED NONE DETECTED   MDMA (Ecstasy)Ur Screen NONE DETECTED NONE DETECTED   Cocaine Metabolite,Ur [place] NONE DETECTED NONE DETECTED   Opiate, Ur Screen NONE DETECTED NONE DETECTED   Phencyclidine (PCP) Ur S NONE DETECTED NONE DETECTED   Cannabinoid 50 Ng, Ur [place] NONE DETECTED NONE DETECTED   Barbiturates, Ur Screen NONE DETECTED NONE DETECTED   Benzodiazepine, Ur Scrn NONE DETECTED NONE DETECTED   Methadone Scn, Ur NONE DETECTED NONE DETECTED    Comment: (NOTE) 563  Tricyclics, urine               Cutoff 1000 ng/mL 200  Amphetamines, urine             Cutoff 1000 ng/mL 300  MDMA (Ecstasy), urine           Cutoff 500 ng/mL 400  Cocaine Metabolite, urine       Cutoff 300 ng/mL 500  Opiate, urine                   Cutoff 300 ng/mL 600  Phencyclidine (PCP), urine      Cutoff 25 ng/mL 700  Cannabinoid, urine              Cutoff 50 ng/mL 800  Barbiturates, urine             Cutoff 200 ng/mL 900  Benzodiazepine, urine           Cutoff 200 ng/mL 1000 Methadone, urine                Cutoff 300 ng/mL 1100 1200 The urine drug screen provides only a preliminary, unconfirmed 1300 analytical test result and should not be used for non-medical 1400 purposes. Clinical consideration and professional judgment should 1500 be applied to any positive drug screen result due to possible 1600 interfering substances. A more specific alternate chemical method 1700 must be used in order  to obtain a confirmed analytical result.  1800 Gas chromato graphy / mass spectrometry (GC/MS) is the preferred 1900 confirmatory method.   Glucose, capillary     Status: Abnormal   Collection Time: 03/19/15  7:43 AM  Result Value Ref Range   Glucose-Capillary 133 (H) 65 - 99 mg/dL  Glucose, capillary     Status: Abnormal   Collection Time: 03/19/15 11:28 AM  Result Value Ref Range   Glucose-Capillary 160 (H) 65 - 99 mg/dL    Vitals: Blood pressure 147/63, pulse 94, temperature 98 F (36.7 C), temperature source Oral, resp. rate 20, height 5' 3"  (1.6 m), weight 70.761 kg (156 lb), SpO2 96 %.  Risk to Self: Suicidal Ideation: No Suicidal Intent: No Is patient at risk for suicide?: No Suicidal Plan?: No Access to Means: No What has been your use of drugs/alcohol within the last 12 months?: None reported How many times?: 0 Other Self Harm Risks: None reported Triggers for Past Attempts: None known Intentional Self Injurious Behavior:  None Risk to Others: Homicidal Ideation: No Thoughts of Harm to Others: No Current Homicidal Intent: No Current Homicidal Plan: No Access to Homicidal Means: No Identified Victim: None reported History of harm to others?: No Assessment of Violence: None Noted Violent Behavior Description: None reported Does patient have access to weapons?: No Criminal Charges Pending?: No Does patient have a court date: No Prior Inpatient Therapy: Prior Inpatient Therapy: Yes Prior Therapy Dates: 2016 Prior Therapy Facilty/Provider(s): [facility] Reason for Treatment: Confusion Prior Outpatient Therapy: Prior Outpatient Therapy: Yes Prior Therapy Dates: June 2016 Prior Therapy Facilty/Provider(s): Unsure Does patient have an ACCT team?: No Does patient have Intensive In-House Services?  : No Does patient have [facility] services? : No Does patient have P4CC services?: No  Current Facility-Administered Medications  Medication Dose Route Frequency Provider Last  Rate Last Dose  . furosemide (LASIX) tablet 20 mg  20 mg Oral Daily Loney Hering, MD   20 mg at 03/19/15 0907  . haloperidol (HALDOL) tablet 0.5 mg  0.5 mg Oral BID Loney Hering, MD   0.5 mg at 03/19/15 0907  . insulin aspart (novoLOG) injection 0-24 Units  0-24 Units Subcutaneous 6 times per day Nena Polio, MD   2 Units at 03/19/15 1158  . insulin aspart protamine- aspart (NOVOLOG MIX 70/30) injection 20 Units  20 Units Subcutaneous Q supper Loney Hering, MD      . insulin aspart protamine- aspart (NOVOLOG MIX 70/30) injection 40 Units  40 Units Subcutaneous Q breakfast Loney Hering, MD      . levothyroxine (SYNTHROID, LEVOTHROID) tablet 75 mcg  75 mcg Oral QAC breakfast Loney Hering, MD   75 mcg at 03/19/15 0906  . metFORMIN (GLUCOPHAGE) tablet 1,000 mg  1,000 mg Oral BID Loney Hering, MD   1,000 mg at 03/19/15 0908  . metoprolol (LOPRESSOR) tablet 50 mg  50 mg Oral BID Loney Hering, MD   50 mg at 03/19/15 0909  . OLANZapine (ZYPREXA) tablet 10 mg  10 mg Oral QHS Loney Hering, MD      . Doristine Counter [facility]) tablet 2.5 mg  2.5 mg Oral QHS Ahmed Prima, MD   2.5 mg at 03/18/15 2337  . pantoprazole (PROTONIX) EC tablet 80 mg  80 mg Oral Daily Loney Hering, MD   80 mg at 03/19/15 0910  . potassium chloride SA (K-DUR,KLOR-CON) CR tablet 20 mEq  20 mEq Oral Daily Loney Hering, MD   20 mEq at 03/19/15 0919  . ranolazine (RANEXA) 12 hr tablet 500 mg  500 mg Oral BID Loney Hering, MD   500 mg at 03/19/15 0911  . traZODone (DESYREL) tablet 100 mg  100 mg Oral QHS Loney Hering, MD       Current Outpatient Prescriptions  Medication Sig Dispense Refill  . furosemide (LASIX) 20 MG tablet Take 20 mg by mouth daily.    . haloperidol (HALDOL) 0.5 MG tablet Take 0.5 mg by mouth 2 (two) times daily.    . insulin NPH-regular Human (NOVOLIN 70/30) (70-30) 100 UNIT/ML injection Inject 20-40 Units into the skin 2 (two) times daily. Pt uses 40 units  in the morning and 20 units in the evening.    Marland Kitchen levothyroxine (SYNTHROID, LEVOTHROID) 75 MCG tablet Take 75 mcg by mouth daily.    . metFORMIN (GLUCOPHAGE) 1000 MG tablet Take 1,000 mg by mouth 2 (two) times daily.    . metoprolol (LOPRESSOR) 50 MG tablet Take 50  mg by mouth 2 (two) times daily.    Marland Kitchen OLANZapine (ZYPREXA) 10 MG tablet Take 10 mg by mouth at bedtime.    Marland Kitchen omeprazole (PRILOSEC) 20 MG capsule Take 20 mg by mouth 2 (two) times daily.    . potassium chloride SA (K-DUR,KLOR-CON) 20 MEQ tablet Take 20 mEq by mouth daily.    . ranolazine (RANEXA) 500 MG 12 hr tablet Take 500 mg by mouth 2 (two) times daily.    . traZODone (DESYREL) 100 MG tablet Take 100 mg by mouth at bedtime.      Musculoskeletal: Strength & Muscle Tone: decreased Gait & Station: Did not ask her to stand Patient leans: N/A  Psychiatric Specialty Exam: Physical Exam  Nursing note and vitals reviewed. Constitutional: She appears well-developed and well-nourished. She appears lethargic.  HENT:  Head: Normocephalic and atraumatic.  Eyes: Conjunctivae are normal. Pupils are equal, round, and reactive to light.  Neck: Normal range of motion.  Cardiovascular: Normal heart sounds.   Respiratory: Effort normal.  GI: Soft.  Musculoskeletal: Normal range of motion.  Neurological: She appears lethargic.  Skin: Skin is warm and dry.  Psychiatric: Her mood appears anxious. Her affect is labile. Her speech is tangential. She is actively hallucinating. Thought content is delusional. She expresses impulsivity. She exhibits abnormal recent memory and abnormal remote memory.    Review of Systems  Constitutional: Negative.   HENT: Negative.   Eyes: Negative.   Respiratory: Negative.   Cardiovascular: Negative.   Gastrointestinal: Negative.   Musculoskeletal: Negative.   Skin: Negative.   Neurological: Negative.   Psychiatric/Behavioral: Positive for depression, hallucinations and memory loss. Negative for suicidal  ideas and substance abuse. The patient is nervous/anxious and has insomnia.     Blood pressure 147/63, pulse 94, temperature 98 F (36.7 C), temperature source Oral, resp. rate 20, height 5' 3"  (1.6 m), weight 70.761 kg (156 lb), SpO2 96 %.Body mass index is 27.64 kg/(m^2).  General Appearance: Disheveled  Eye Sport and exercise psychologist::  Fair  Speech:  Slow  Volume:  Patient is quite hard of hearing and require shouting to communicate  Mood:  Anxious  Affect:  Congruent  Thought Process:  Disorganized  Orientation:  Full (Time, Place, and Person)  Thought Content:  Hallucinations: Auditory  Suicidal Thoughts:  No  Homicidal Thoughts:  No  Memory:  Immediate;   Fair Recent;   Fair Remote;   Fair  Judgement:  Impaired  Insight:  Fair  Psychomotor Activity:  Decreased  Concentration:  Fair  Recall:  [employer] [place]  Language: Fair  Akathisia:  No  Handed:  Right  AIMS (if indicated):     Assets:  Communication Skills Desire for Improvement Financial Resources/Insurance Housing Social Support  ADL's:  Intact  Cognition: Impaired,  Mild  Sleep:      Medical Decision Making: Review of Psycho-Social Stressors (1), Review or order clinical lab tests (1), Established Problem, Worsening (2), Review of Medication Regimen & Side Effects (2) and Review of New Medication or Change in Dosage (2)  Treatment Plan Summary: Medication management and Plan Patient appears not to have a clear medical problem. Labs are largely unremarkable. Her sugars are in good Ur good control. She is having worsening of her psychosis with sleeplessness and agitation. It is not as bad as it has been in the past but it is bound to get worse. Patient requires inpatient hospitalization for stabilization. She has been referred to an geriatric psychiatry unit an excepted. Commitment paperwork filed  to facilitate safety of transport. Supportive counseling done. Case discussed with emergency room doctor. She can be  discharged as soon as we have transport  Plan:  Recommend psychiatric Inpatient admission when medically cleared. Supportive therapy provided about ongoing stressors. Disposition: Discharge to be transported to a geriatric psychiatry unit for more appropriate level of care  Lazette Estala 03/19/2015 5:15 PM

## 2015-03-19 NOTE — ED Notes (Signed)
BEHAVIORAL HEALTH ROUNDING Patient sleeping: Yes.   Patient alert and oriented: yes Behavior appropriate: Yes.  ; If no, describe:  Nutrition and fluids offered: yes Toileting and hygiene offered: Yes  Sitter present: yes Law enforcement present: Yes   

## 2015-03-19 NOTE — ED Notes (Signed)
BEHAVIORAL HEALTH ROUNDING Patient sleeping: No. Patient alert and oriented: yes Behavior appropriate: Yes.   Nutrition and fluids offered: Yes  Toileting and hygiene offered: No Sitter present: no Law enforcement present: Yes

## 2015-03-19 NOTE — ED Notes (Signed)
CBG of 160 before lunch was administered.

## 2015-03-19 NOTE — ED Notes (Signed)

## 2015-05-07 ENCOUNTER — Other Ambulatory Visit: Payer: Self-pay | Admitting: Internal Medicine

## 2015-05-07 DIAGNOSIS — Z1231 Encounter for screening mammogram for malignant neoplasm of breast: Secondary | ICD-10-CM

## 2015-05-17 ENCOUNTER — Ambulatory Visit
Admission: RE | Admit: 2015-05-17 | Discharge: 2015-05-17 | Disposition: A | Discharge status: Home / Self Care | Payer: Medicare Other | Source: Ambulatory Visit | Attending: Internal Medicine | Admitting: Internal Medicine

## 2015-05-17 ENCOUNTER — Other Ambulatory Visit: Payer: Self-pay | Admitting: Internal Medicine

## 2015-05-17 DIAGNOSIS — Z1231 Encounter for screening mammogram for malignant neoplasm of breast: Secondary | ICD-10-CM

## 2015-06-03 ENCOUNTER — Emergency Department
Admission: EM | Admit: 2015-06-03 | Discharge: 2015-06-04 | Disposition: A | Discharge status: Home / Self Care | Payer: Medicare Other | Attending: Emergency Medicine | Admitting: Emergency Medicine

## 2015-06-03 DIAGNOSIS — R440 Auditory hallucinations: Secondary | ICD-10-CM | POA: Diagnosis not present

## 2015-06-03 DIAGNOSIS — Z79899 Other long term (current) drug therapy: Secondary | ICD-10-CM | POA: Diagnosis not present

## 2015-06-03 DIAGNOSIS — I10 Essential (primary) hypertension: Secondary | ICD-10-CM | POA: Diagnosis not present

## 2015-06-03 DIAGNOSIS — R4182 Altered mental status, unspecified: Secondary | ICD-10-CM | POA: Diagnosis present

## 2015-06-03 DIAGNOSIS — Z794 Long term (current) use of insulin: Secondary | ICD-10-CM | POA: Diagnosis not present

## 2015-06-03 DIAGNOSIS — N390 Urinary tract infection, site not specified: Secondary | ICD-10-CM | POA: Insufficient documentation

## 2015-06-03 DIAGNOSIS — Z880 Allergy status to penicillin: Secondary | ICD-10-CM | POA: Diagnosis not present

## 2015-06-03 DIAGNOSIS — E119 Type 2 diabetes mellitus without complications: Secondary | ICD-10-CM

## 2015-06-03 DIAGNOSIS — F29 Unspecified psychosis not due to a substance or known physiological condition: Secondary | ICD-10-CM | POA: Diagnosis not present

## 2015-06-03 DIAGNOSIS — Z7982 Long term (current) use of aspirin: Secondary | ICD-10-CM | POA: Diagnosis not present

## 2015-06-03 DIAGNOSIS — Z7984 Long term (current) use of oral hypoglycemic drugs: Secondary | ICD-10-CM | POA: Diagnosis not present

## 2015-06-03 LAB — COMPREHENSIVE METABOLIC PANEL
ALT: 12 U/L — ABNORMAL LOW (ref 14–54)
AST: 19 U/L (ref 15–41)
Albumin: 4.1 g/dL (ref 3.5–5.0)
Alkaline Phosphatase: 62 U/L (ref 38–126)
Anion gap: 6 (ref 5–15)
BUN: 10 mg/dL (ref 6–20)
CHLORIDE: 106 mmol/L (ref 101–111)
CO2: 30 mmol/L (ref 22–32)
Calcium: 10.1 mg/dL (ref 8.9–10.3)
Creatinine, Ser: 0.74 mg/dL (ref 0.44–1.00)
Glucose, Bld: 154 mg/dL — ABNORMAL HIGH (ref 65–99)
POTASSIUM: 3.5 mmol/L (ref 3.5–5.1)
SODIUM: 142 mmol/L (ref 135–145)
Total Bilirubin: 0.6 mg/dL (ref 0.3–1.2)
Total Protein: 7.6 g/dL (ref 6.5–8.1)

## 2015-06-03 LAB — SALICYLATE LEVEL

## 2015-06-03 LAB — CBC
HCT: 36 % (ref 35.0–47.0)
HEMOGLOBIN: 11.9 g/dL — AB (ref 12.0–16.0)
MCH: 30.5 pg (ref 26.0–34.0)
MCHC: 33 g/dL (ref 32.0–36.0)
MCV: 92.4 fL (ref 80.0–100.0)
PLATELETS: 324 10*3/uL (ref 150–440)
RBC: 3.9 MIL/uL (ref 3.80–5.20)
RDW: 15.4 % — ABNORMAL HIGH (ref 11.5–14.5)
WBC: 9.2 10*3/uL (ref 3.6–11.0)

## 2015-06-03 LAB — ACETAMINOPHEN LEVEL: Acetaminophen (Tylenol), Serum: <10 ug/mL — ABNORMAL LOW (ref 10–30)

## 2015-06-03 LAB — ETHANOL

## 2015-06-03 NOTE — ED Notes (Addendum)
EDT, Misty StanleyLisa in triage to perform protocols and dress pt in scrubs; pt's wallet/jewelry (alert bracelet, silver tone watch, silver tone band with silver tone band & clear stone)/meds given to charge nurse Raquel to secure

## 2015-06-03 NOTE — ED Notes (Signed)
Patient ambulatory to triage with steady gait, without difficulty or distress noted, [facility] pt st she called the police to bring her in; st hearing voices telling her "they were gonna kill her and her dog"; then she saw someone she thinks the voices belonged to; [facility]

## 2015-06-04 DIAGNOSIS — F29 Unspecified psychosis not due to a substance or known physiological condition: Secondary | ICD-10-CM | POA: Diagnosis not present

## 2015-06-04 LAB — URINE DRUG SCREEN, QUALITATIVE (ARMC ONLY)
Amphetamines, Ur Screen: NOT DETECTED
Barbiturates, Ur Screen: NOT DETECTED
Benzodiazepine, Ur Scrn: NOT DETECTED
COCAINE METABOLITE, UR ~~LOC~~: NOT DETECTED
Cannabinoid 50 Ng, Ur ~~LOC~~: NOT DETECTED
MDMA (ECSTASY) UR SCREEN: NOT DETECTED
Methadone Scn, Ur: NOT DETECTED
Opiate, Ur Screen: NOT DETECTED
Phencyclidine (PCP) Ur S: NOT DETECTED
TRICYCLIC, UR SCREEN: NOT DETECTED

## 2015-06-04 LAB — URINALYSIS COMPLETE WITH MICROSCOPIC (ARMC ONLY)
Bilirubin Urine: NEGATIVE
Glucose, UA: 150 mg/dL — AB
Hgb urine dipstick: NEGATIVE
KETONES UR: NEGATIVE mg/dL
NITRITE: NEGATIVE
PH: 6 (ref 5.0–8.0)
PROTEIN: NEGATIVE mg/dL
SPECIFIC GRAVITY, URINE: 1.011 (ref 1.005–1.030)

## 2015-06-04 MED ORDER — OLANZAPINE 10 MG PO TABS: 10.0000 mg | ORAL_TABLET | Freq: Every day | ORAL | Status: DC

## 2015-06-04 MED ORDER — FUROSEMIDE 40 MG PO TABS
20.0000 mg | ORAL_TABLET | Freq: Every day | ORAL | Status: DC
Start: 2015-06-04 — End: 2015-06-04
  Administered 2015-06-04: 20 mg via ORAL
  Filled 2015-06-04: qty 1

## 2015-06-04 MED ORDER — ALUM & MAG HYDROXIDE-SIMETH 200-200-20 MG/5ML PO SUSP
30.0000 mL | Freq: Once | ORAL | Status: AC
  Administered 2015-06-04: 30 mL via ORAL
  Filled 2015-06-04: qty 30

## 2015-06-04 MED ORDER — RANOLAZINE ER 500 MG PO TB12
500.0000 mg | ORAL_TABLET | Freq: Two times a day (BID) | ORAL | Status: DC
  Administered 2015-06-04: 500 mg via ORAL
  Filled 2015-06-04 (×2): qty 1

## 2015-06-04 MED ORDER — METOPROLOL TARTRATE 50 MG PO TABS: 50.0000 mg | ORAL_TABLET | Freq: Two times a day (BID) | ORAL | Status: DC

## 2015-06-04 MED ORDER — OLANZAPINE 10 MG PO TABS: 10.0000 mg | ORAL_TABLET | Freq: Every day | ORAL | Status: AC

## 2015-06-04 MED ORDER — FUROSEMIDE 40 MG PO TABS: 20.0000 mg | ORAL_TABLET | Freq: Every day | ORAL | Status: DC

## 2015-06-04 MED ORDER — METFORMIN HCL 500 MG PO TABS
1000.0000 mg | ORAL_TABLET | Freq: Two times a day (BID) | ORAL | Status: DC
  Administered 2015-06-04 (×2): 1000 mg via ORAL
  Filled 2015-06-04 (×2): qty 2

## 2015-06-04 MED ORDER — RANITIDINE HCL 150 MG/10ML PO SYRP
150.0000 mg | ORAL_SOLUTION | Freq: Once | ORAL | Status: DC
  Filled 2015-06-04 (×2): qty 10

## 2015-06-04 MED ORDER — CEPHALEXIN 500 MG PO CAPS: 500.0000 mg | ORAL_CAPSULE | Freq: Three times a day (TID) | ORAL | Status: DC

## 2015-06-04 MED ORDER — FAMOTIDINE 20 MG PO TABS
40.0000 mg | ORAL_TABLET | Freq: Once | ORAL | Status: AC
  Administered 2015-06-04: 40 mg via ORAL
  Filled 2015-06-04: qty 2

## 2015-06-04 MED ORDER — CEPHALEXIN 500 MG PO CAPS
500.0000 mg | ORAL_CAPSULE | Freq: Once | ORAL | Status: AC
  Administered 2015-06-04: 500 mg via ORAL
  Filled 2015-06-04: qty 1

## 2015-06-04 MED ORDER — RANOLAZINE ER 500 MG PO TB12: 500.0000 mg | ORAL_TABLET | Freq: Two times a day (BID) | ORAL | Status: DC

## 2015-06-04 MED ORDER — OLANZAPINE 10 MG PO TABS
10.0000 mg | ORAL_TABLET | Freq: Every day | ORAL | Status: DC
  Filled 2015-06-04: qty 1

## 2015-06-04 MED ORDER — METOPROLOL TARTRATE 50 MG PO TABS
50.0000 mg | ORAL_TABLET | Freq: Two times a day (BID) | ORAL | Status: DC
  Administered 2015-06-04: 50 mg via ORAL
  Filled 2015-06-04: qty 1

## 2015-06-04 MED ORDER — POTASSIUM CHLORIDE CRYS ER 20 MEQ PO TBCR
20.0000 meq | EXTENDED_RELEASE_TABLET | Freq: Every day | ORAL | Status: DC
  Administered 2015-06-04: 20 meq via ORAL
  Filled 2015-06-04: qty 1

## 2015-06-04 NOTE — ED Notes (Signed)
Made phone call to daughter

## 2015-06-04 NOTE — BH Assessment (Signed)
Assessment Note  Danielle Boyer is an 74 y.o. female. Danielle Boyer reports to the ED with concerns that she has been having auditory hallucinations.  She contacted EMS due to her symptoms.  She states "I was in the living room talking to my dog and it sounded like a whole lot of people were surrounding my home. I went out to see it and it was and it was only one man out there".  She states that he said he was gonna kill her and her dog. She then stated that there was no body there to see, but she could hear the talking and carrying on. She denied symptoms of depression or anxiety. She denied suicidal or homicidal ideation or intent.  She reports that she lives alone. She states that she is a widow 4 times over. 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 Diabetes mellitus without complication (HCC)   . Hypertension     Past Surgical History  Procedure Laterality Date  . Abdominal hysterectomy    . Cesarean section      x3    Family History:  Family History  Problem Relation Age of Onset  . Breast cancer Sister     Social History:  reports that she has never smoked. She does not have any smokeless tobacco history on file. She reports that she does not drink alcohol. Her drug history is not on file.  Additional Social History:  Alcohol / Drug Use History of alcohol / drug use?: No history of alcohol / drug abuse  CIWA: CIWA-Ar BP: 131/74 mmHg Pulse Rate: 85 COWS:    Allergies:  Allergies  Allergen Reactions  . Citalopram Other (See Comments)    Reaction:  Unknown   . Penicillins Rash    Home Medications:  (Not in a hospital admission)  OB/GYN Status:  No LMP recorded. Patient has had a hysterectomy.  General Assessment Data [facility] Assessment: In system Is this a Tele or Face-to-Face Assessment?: Face-to-Face Is  this an Initial Assessment or a Re-assessment for this encounter?: Initial Assessment Marital status: Widowed Bear DanceMaiden name: Danielle Boyer Is patient pregnant?: No Pregnancy Status: No Living Arrangements: Alone Can pt return to current living arrangement?: Yes Admission Status: Involuntary Is patient capable of signing voluntary admission?: Yes Referral Source: Self/Family/Friend Insurance type: Research officer, [employer]  [facility] Medical Exam completed: Yes  Crisis Care Plan Living Arrangements: Alone Name of Psychiatrist: None at this time Name of Therapist: None  Education Status Is patient currently in school?: No Current Grade: n/a Highest grade of school patient has completed: 12th  Risk to self with the past 6 months Suicidal Ideation: No Has patient been a risk to self within the past 6 months prior to admission? : No Suicidal Intent: No Has patient had any suicidal intent within the past 6 months prior to admission? : No Is patient at risk for suicide?: No Suicidal Plan?: No Has patient had any suicidal plan within the past 6 months prior to admission? : No Access to Means: No What has been your use of drugs/alcohol within the last 12 months?: None Previous Attempts/Gestures: No How many times?: 0 Other Self Harm Risks: n/a Triggers for Past Attempts: Other (Comment) Intentional Self Injurious Behavior: None Family Suicide History: Unknown Recent stressful life event(s):  (None reported) Persecutory  voices/beliefs?: No Depression: No Depression Symptoms:  (denied) Substance abuse history and/or treatment for substance abuse?: No Suicide prevention information given to non-admitted patients: Not applicable  Risk to Others within the past 6 months Homicidal Ideation: No Does patient have any lifetime risk of violence toward others beyond the six months prior to admission? : No Thoughts of Harm to Others: No Current Homicidal Intent:  No Current Homicidal Plan: No Access to Homicidal Means: No Identified Victim: None reported History of harm to others?: No Assessment of Violence: None Noted Violent Behavior Description: None Does patient have access to weapons?: No Criminal Charges Pending?: No Does patient have a court date: No Is patient on probation?: No  Psychosis Hallucinations: Auditory, Visual Delusions: None noted  Mental Status Report Appearance/Hygiene: In scrubs, Unremarkable Eye Contact: Good Motor Activity: Unremarkable Speech: Loud (Lost her hearing aids) Level of Consciousness: Alert Mood: Euthymic Affect: Appropriate to circumstance Anxiety Level: None Thought Processes: Coherent Judgement: Unimpaired Orientation: Place, Time, Situation Obsessive Compulsive Thoughts/Behaviors: None  Cognitive Functioning Concentration: Normal Memory: Recent Intact IQ: Average Insight: Fair Impulse Control: Fair Appetite: Fair Sleep: Decreased Vegetative Symptoms: None  ADLScreening [facility] Assessment Services) Patient's cognitive ability adequate to safely complete daily activities?: Yes Patient able to express need for assistance with ADLs?: Yes Independently performs ADLs?: Yes (appropriate for developmental age)  Prior Inpatient Therapy Prior Inpatient Therapy: Yes Prior Therapy Dates: 2016 Prior Therapy Facilty/Provider(s): [facility] Reason for Treatment: psychotic episodes  Prior Outpatient Therapy Prior Outpatient Therapy: Yes Prior Therapy Dates: 2016 - until 2 weeks ago Prior Therapy Facilty/Provider(s): Dr. Jennet Maduro Reason for Treatment: hallucinations Does patient have an ACCT team?: No Does patient have Intensive In-House Services?  : No Does patient have [facility] services? : No Does patient have P4CC services?: No  ADL Screening (condition at time of admission) Patient's cognitive ability adequate to safely complete daily activities?: Yes Patient able to express need for  assistance with ADLs?: Yes Independently performs ADLs?: Yes (appropriate for developmental age)       Abuse/Neglect Assessment (Assessment to be complete while patient is alone) Physical Abuse: Denies Verbal Abuse: Denies Sexual Abuse: Denies Exploitation of patient/patient's resources: Denies Self-Neglect: Denies Values / Beliefs Cultural Requests During Hospitalization: None Spiritual Requests During Hospitalization: None   Advance Directives (For Healthcare) Does patient have an advance directive?: No Would patient like information on creating an advanced directive?: Yes - Educational materials given    Additional Information 1:1 In Past 12 Months?: No CIRT Risk: No Elopement Risk: No Does patient have medical clearance?: No     Disposition:  Disposition Initial Assessment Completed for this Encounter: Yes Disposition of Patient: Other dispositions (to be seen by the psychiatrist)  On Site Evaluation by:   Reviewed with Physician:    Justice Deeds 06/04/2015 1:08 AM

## 2015-06-04 NOTE — Progress Notes (Signed)
TTS has called the pts family to update them of the pts discharge. It appears that the pts three children share health care power of attorney. TTS has left a message with the pts Daughter Synetta Failnita @ [phone] and [phone].  06/04/2015 Cheryl FlashNicole Docia Klar, MS, NCC, LPCA Therapeutic Triage Specialist

## 2015-06-04 NOTE — ED Notes (Addendum)
BEHAVIORAL HEALTH ROUNDING Patient sleeping: Yes.   Patient alert and oriented: sleeping Behavior appropriate: Yes.  ; If no, describe: sleeping Nutrition and fluids offered: sleeping Toileting and hygiene offered: sleeping Sitter present: no Law enforcement present: Yes  and ODS 

## 2015-06-04 NOTE — ED Notes (Signed)
BEHAVIORAL HEALTH ROUNDING Patient sleeping: Yes.   Patient alert and oriented: pt sleeping Behavior appropriate: Yes.  ; If no, describe:  Nutrition and fluids offered: No Toileting and hygiene offered: No Sitter present: yes Law enforcement present: Yes

## 2015-06-04 NOTE — ED Notes (Signed)
Security brought belongings and verified belongings, cash, and jewelry with patient and patient's daughter Synetta Failnita.

## 2015-06-04 NOTE — Consult Note (Signed)
[facility] [facility] Consult   Reason for Consult:  74 year old woman with a history of psychotic symptoms previously diagnosed as bipolar disorder who presents to the hospital with a complaint of having auditory hallucinations Referring Physician:  Ruch Patient Identification: Danielle Boyer MRN:  [id number] Principal Diagnosis: Psychosis Diagnosis:   Patient Active Problem List   Diagnosis Date Noted  . Psychosis [F29] 06/04/2015  . 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 Bipolar I disorder, single manic episode, severe, with psychosis ([place]) [F30.2] 03/19/2015  . Diabetes ([place]) [E11.9] 03/19/2015  . 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 female patient admitted with "do you hear anyone talking in here?".  HPI:  Information from the patient and the chart. Patient interviewed. Chart reviewed including previous notes, medication list, previous psychiatric evaluations. Patient had her self brought to the emergency room because she has had a return of her auditory hallucinations. They have been going on for just the last couple days. She hears voices coming from the ceiling of her house. It sounds like one man and perhaps to women. They have said some things that have upset her including saying that they were going to hurt her or kill her dog. Patient says she has heard the voices in the room here at the hospital as well. She has not been sleeping well the last couple days. She claims that she is compliant with her medicine but she does not know the names of what she is taking. She denies having any suicidal thoughts at all. Denies homicidal thoughts. Mood she says is all right. Doesn't indicate that she's been engaged in any dangerous activity. She says she has been taking her medicine for her diabetes normally. 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  Looking at her medicine list it looks like she probably has not been recently taking her Zyprexa which was a previously prescribed medicine for this. Unclear if she has even been taking her Haldol.  Social history: Patient lives independently. She has several adult children who look in on her. Has mild memory impairment.  Medical history: Patient has a history of diabetes and some elevated blood pressure. History of dementia.  Substance abuse history: Says that when she was a much much younger woman she drank a little bit but has not had a problem with substance abuse in decades.  Past Psychiatric History: Patient has had several prior episodes of presentation with these symptoms and has been diagnosed with bipolar disorder in the past although it's not clear how bad the mood symptoms have really ever been. She has responded well to antipsychotics. There is no history of suicidal behavior or violent behavior. She has had previous admissions at some point. She was following up with an outpatient psychiatrist in our clinic.  Risk to Self: Suicidal Ideation: No Suicidal Intent: No Is patient at risk for suicide?: No Suicidal Plan?: No Access to Means: No What has been your use of drugs/alcohol within the last 12 months?: None How many times?: 0 Other Self Harm Risks: n/a Triggers for Past Attempts: Other (Comment) Intentional Self Injurious Behavior: None Risk to Others: Homicidal Ideation: No Thoughts of Harm to Others: No Current Homicidal Intent: No Current Homicidal Plan: No Access to Homicidal Means: No Identified Victim: None reported History of harm to others?: No Assessment of Violence: None Noted  Violent Behavior Description: None Does patient have access to weapons?: No Criminal Charges Pending?: No Does patient have a court date: No Prior Inpatient Therapy: Prior Inpatient Therapy: Yes Prior Therapy Dates: 2016 Prior Therapy Facilty/Provider(s): [facility] Reason for Treatment:  psychotic episodes Prior Outpatient Therapy: Prior Outpatient Therapy: Yes Prior Therapy Dates: 2016 - until 2 weeks ago Prior Therapy Facilty/Provider(s): Dr. Bary Leriche Reason for Treatment: hallucinations Does patient have an ACCT team?: No Does patient have Intensive In-House Services?  : No Does patient have [facility] services? : No Does patient have P4CC services?: No  Past Medical History:  Past Medical History  Diagnosis Date  . Diabetes mellitus without complication ([place])   . Hypertension     Past Surgical History  Procedure Laterality Date  . Abdominal hysterectomy    . Cesarean section      x3   Family History:  Family History  Problem Relation Age of Onset  . Breast cancer Sister    Family Psychiatric  History: Patient denies there being any family history that she knows of of any mental health problems Social History:  History  Alcohol Use No     History  Drug Use Not on file    Social History   Social History  . Marital Status: Widowed    Spouse Name: N/A  . Number of Children: N/A  . Years of Education: N/A   Social History Main Topics  . Smoking status: Never Smoker   . Smokeless tobacco: Not on file  . Alcohol Use: No  . Drug Use: Not on file  . Sexual Activity: Not on file   Other Topics Concern  . Not on file   Social History Narrative   Additional Social History:    History of alcohol / drug use?: No history of alcohol / drug abuse                     Allergies:   Allergies  Allergen Reactions  . Citalopram Other (See Comments)    Reaction:  Unknown   . Penicillins Rash    Labs:  Results for orders placed or performed during the hospital encounter of 06/03/15 (from the past 48 hour(s))  Comprehensive metabolic panel     Status: Abnormal   Collection Time: 06/03/15 11:09 PM  Result Value Ref Range   Sodium 142 135 - 145 mmol/L   Potassium 3.5 3.5 - 5.1 mmol/L   Chloride 106 101 - 111 mmol/L   CO2 30 22 - 32 mmol/L    Glucose, Bld 154 (H) 65 - 99 mg/dL   BUN 10 6 - 20 mg/dL   Creatinine, Ser 0.74 0.44 - 1.00 mg/dL   Calcium 10.1 8.9 - 10.3 mg/dL   Total Protein 7.6 6.5 - 8.1 g/dL   Albumin 4.1 3.5 - 5.0 g/dL   AST 19 15 - 41 U/L   ALT 12 (L) 14 - 54 U/L   Alkaline Phosphatase 62 38 - 126 U/L   Total Bilirubin 0.6 0.3 - 1.2 mg/dL   GFR calc non Af Amer >60 >60 mL/min   GFR calc Af Amer >60 >60 mL/min    Comment: (NOTE) The eGFR has been calculated using the CKD EPI equation. This calculation has not been validated in all clinical situations. eGFR's persistently <60 mL/min signify possible Chronic Kidney Disease.    Anion gap 6 5 - 15  Ethanol (ETOH)     Status: None   Collection Time: 06/03/15 11:09 PM  Result Value Ref Range   Alcohol, Ethyl (B) <5 <5 mg/dL    Comment:        LOWEST DETECTABLE LIMIT FOR SERUM ALCOHOL IS 5 mg/dL FOR MEDICAL PURPOSES ONLY   Salicylate level     Status: None   Collection Time: 06/03/15 11:09 PM  Result Value Ref Range   Salicylate Lvl <0.3 2.8 - 30.0 mg/dL  Acetaminophen level     Status: Abnormal   Collection Time: 06/03/15 11:09 PM  Result Value Ref Range   Acetaminophen (Tylenol), Serum <10 (L) 10 - 30 ug/mL    Comment:        THERAPEUTIC CONCENTRATIONS VARY SIGNIFICANTLY. A RANGE OF 10-30 ug/mL MAY BE AN EFFECTIVE CONCENTRATION FOR MANY PATIENTS. HOWEVER, SOME ARE BEST TREATED AT CONCENTRATIONS OUTSIDE THIS RANGE. ACETAMINOPHEN CONCENTRATIONS >150 ug/mL AT 4 HOURS AFTER INGESTION AND >50 ug/mL AT 12 HOURS AFTER INGESTION ARE OFTEN ASSOCIATED WITH TOXIC REACTIONS.   CBC     Status: Abnormal   Collection Time: 06/03/15 11:09 PM  Result Value Ref Range   WBC 9.2 3.6 - 11.0 K/uL   RBC 3.90 3.80 - 5.20 MIL/uL   Hemoglobin 11.9 (L) 12.0 - 16.0 g/dL   HCT 36.0 35.0 - 47.0 %   MCV 92.4 80.0 - 100.0 fL   MCH 30.5 26.0 - 34.0 pg   MCHC 33.0 32.0 - 36.0 g/dL   RDW 15.4 (H) 11.5 - 14.5 %   Platelets 324 150 - 440 K/uL  Urine Drug Screen,  Qualitative ([facility] only)     Status: None   Collection Time: 06/04/15 12:21 AM  Result Value Ref Range   Tricyclic, Ur Screen NONE DETECTED NONE DETECTED   Amphetamines, Ur Screen NONE DETECTED NONE DETECTED   MDMA (Ecstasy)Ur Screen NONE DETECTED NONE DETECTED   Cocaine Metabolite,Ur [place] NONE DETECTED NONE DETECTED   Opiate, Ur Screen NONE DETECTED NONE DETECTED   Phencyclidine (PCP) Ur S NONE DETECTED NONE DETECTED   Cannabinoid 50 Ng, Ur [place] NONE DETECTED NONE DETECTED   Barbiturates, Ur Screen NONE DETECTED NONE DETECTED   Benzodiazepine, Ur Scrn NONE DETECTED NONE DETECTED   Methadone Scn, Ur NONE DETECTED NONE DETECTED    Comment: (NOTE) 009  Tricyclics, urine               Cutoff 1000 ng/mL 200  Amphetamines, urine             Cutoff 1000 ng/mL 300  MDMA (Ecstasy), urine           Cutoff 500 ng/mL 400  Cocaine Metabolite, urine       Cutoff 300 ng/mL 500  Opiate, urine                   Cutoff 300 ng/mL 600  Phencyclidine (PCP), urine      Cutoff 25 ng/mL 700  Cannabinoid, urine              Cutoff 50 ng/mL 800  Barbiturates, urine             Cutoff 200 ng/mL 900  Benzodiazepine, urine           Cutoff 200 ng/mL 1000 Methadone, urine                Cutoff 300 ng/mL 1100 1200 The urine drug screen provides only a preliminary, unconfirmed 1300 analytical test result and should not be used for non-medical 1400 purposes. Clinical consideration and professional judgment should 1500 be applied to  any positive drug screen result due to possible 1600 interfering substances. A more specific alternate chemical method 1700 must be used in order to obtain a confirmed analytical result.  1800 Gas chromato graphy / mass spectrometry (GC/MS) is the preferred 1900 confirmatory method.   Urinalysis complete, with microscopic ([facility] only)     Status: Abnormal   Collection Time: 06/04/15 12:21 AM  Result Value Ref Range   Color, Urine YELLOW (A) YELLOW   APPearance CLEAR (A) CLEAR   Glucose,  UA 150 (A) NEGATIVE mg/dL   Bilirubin Urine NEGATIVE NEGATIVE   Ketones, ur NEGATIVE NEGATIVE mg/dL   Specific Gravity, Urine 1.011 1.005 - 1.030   Hgb urine dipstick NEGATIVE NEGATIVE   pH 6.0 5.0 - 8.0   Protein, ur NEGATIVE NEGATIVE mg/dL   Nitrite NEGATIVE NEGATIVE   Leukocytes, UA 2+ (A) NEGATIVE   RBC / HPF 0-5 0 - 5 RBC/hpf   WBC, UA 6-30 0 - 5 WBC/hpf   Bacteria, UA RARE (A) NONE SEEN   Squamous Epithelial / LPF 0-5 (A) NONE SEEN   Mucous PRESENT    Budding Yeast PRESENT     Current Facility-Administered Medications  Medication Dose Route Frequency Provider Last Rate Last Dose  . furosemide (LASIX) tablet 20 mg  20 mg Oral Daily Orbie Pyo, MD   20 mg at 06/04/15 0941  . metFORMIN (GLUCOPHAGE) tablet 1,000 mg  1,000 mg Oral BID Gregor Hams, MD   1,000 mg at 06/04/15 0941  . metoprolol (LOPRESSOR) tablet 50 mg  50 mg Oral BID Orbie Pyo, MD   50 mg at 06/04/15 [phone]  . OLANZapine (ZYPREXA) tablet 10 mg  10 mg Oral QHS Orbie Pyo, MD      . OLANZapine [facility]) tablet 10 mg  10 mg Oral QHS Gonzella Lex, MD      . potassium chloride SA (K-DUR,KLOR-CON) CR tablet 20 mEq  20 mEq Oral Daily Gregor Hams, MD   20 mEq at 06/04/15 0941  . ranolazine (RANEXA) 12 hr tablet 500 mg  500 mg Oral BID Orbie Pyo, MD   500 mg at 06/04/15 [phone]   Current Outpatient Prescriptions  Medication Sig Dispense Refill  . furosemide (LASIX) 20 MG tablet Take 20 mg by mouth daily.    . haloperidol (HALDOL) 0.5 MG tablet Take 0.5 mg by mouth 2 (two) times daily.    . insulin NPH-regular Human (NOVOLIN 70/30) (70-30) 100 UNIT/ML injection Inject 20-40 Units into the skin 2 (two) times daily. Pt uses 40 units in the morning and 20 units in the evening.    Marland Kitchen levothyroxine (SYNTHROID, LEVOTHROID) 75 MCG tablet Take 75 mcg by mouth daily.    . metFORMIN (GLUCOPHAGE) 1000 MG tablet Take 1,000 mg by mouth 2 (two) times daily.    . metoprolol  (LOPRESSOR) 50 MG tablet Take 50 mg by mouth 2 (two) times daily.    Marland Kitchen OLANZapine (ZYPREXA) 10 MG tablet Take 10 mg by mouth at bedtime.    Marland Kitchen omeprazole (PRILOSEC) 20 MG capsule Take 20 mg by mouth 2 (two) times daily.    . potassium chloride SA (K-DUR,KLOR-CON) 20 MEQ tablet Take 20 mEq by mouth daily.    . ranolazine (RANEXA) 500 MG 12 hr tablet Take 500 mg by mouth 2 (two) times daily.    . traZODone (DESYREL) 100 MG tablet Take 100 mg by mouth at bedtime.    Marland Kitchen OLANZapine (ZYPREXA) 10 MG tablet Take 1 tablet (  10 mg total) by mouth at bedtime. 30 tablet 1    Musculoskeletal: Strength & Muscle Tone: decreased Gait & Station: normal Patient leans: N/A  Psychiatric Specialty Exam: Review of Systems  Constitutional: Negative.   HENT: Negative.   Eyes: Negative.   Respiratory: Negative.   Cardiovascular: Negative.   Gastrointestinal: Negative.   Musculoskeletal: Negative.   Skin: Negative.   Neurological: Negative.   Psychiatric/Behavioral: Positive for hallucinations. Negative for depression, suicidal ideas, memory loss and substance abuse. The patient has insomnia. The patient is not nervous/anxious.     Blood pressure 156/68, pulse 83, temperature 98.3 F (36.8 C), temperature source Oral, resp. rate 14, height _0  (1.6 m), weight 73.483 kg (162 lb), SpO2 98 %.Body mass index is 28.7 kg/(m^2).  General Appearance: Disheveled  Eye Contact::  Good  Speech:  Garbled and Slow  Volume:  Decreased  Mood:  Anxious  Affect:  Full Range  Thought Process:  Tangential  Orientation:  Full (Time, Place, and Person)  Thought Content:  Hallucinations: Auditory  Suicidal Thoughts:  No  Homicidal Thoughts:  No  Memory:  Immediate;   Fair Recent;   Fair Remote;   Fair  Judgement:  Fair  Insight:  Fair  Psychomotor Activity:  Normal  Concentration:  Fair  Recall:  [employer] of Knowledge:Fair  Language: Fair  Akathisia:  No  Handed:  Right  AIMS (if indicated):     Assets:   Communication Skills Desire for Improvement Financial Resources/Insurance Housing Resilience Social Support  ADL's:  Intact  Cognition: Impaired,  Mild  Sleep:      Treatment Plan Summary: Medication management and Plan 74 year old woman with a known history of these kind of symptoms that of responded well to medicine in the past. She is not currently voicing any suicidal or homicidal ideation and there is no indication of acute dangerousness. Options would be to either keep her here in the emergency room with referral to a geriatric psychiatry bed or to simply restart her medicine. Given that she has done well as an outpatient in the past I have offered the patient the opportunity to restart Zyprexa 10 mg at night. She is agreeable to the plan. She has been educated that it may take a couple days for the voices to get better but that she should be able to sleep better as soon as she starts the medicine. Side effects reviewed. Patient agrees. She can follow-up with Dr. Mamie Nick in our office or call back there to make an appointment with a new psychiatrist. I've given her a month's supply with a refill of 10 mg of Zyprexa at night and have sent a prescription to her local pharmacy. As far as her diabetes or sugar is only modestly elevated and she should continue to follow-up with her primary care doctor for that. I have encouraged her to look into getting another hearing aid as it is very difficult to communicate with her without shouting. Case reviewed with emergency room doctor. Involuntary commitment discontinued. Patient can be discharged at the discretion of the ER.  Disposition: No evidence of imminent risk to self or others at present.   Patient does not meet criteria for psychiatric inpatient admission. Supportive therapy provided about ongoing stressors.  Kiarra Kidd 06/04/2015 12:54 PM

## 2015-06-04 NOTE — ED Notes (Signed)
Security went to gather patient's belongings from the safe.

## 2015-06-04 NOTE — ED Notes (Signed)
BEHAVIORAL HEALTH ROUNDING Patient sleeping: Yes.   Patient alert and oriented: sleeping Behavior appropriate: Yes.  ; If no, describe: sleeping Nutrition and fluids offered: sleeping Toileting and hygiene offered: sleeping Sitter present: no Law enforcement present: Yes  and ODS 

## 2015-06-04 NOTE — ED Notes (Signed)
Patient's daughter is at bedside to transport patient home. Daughter states that she couldn't get the zyprexa prescription filled because the pharmacist said that the patient recently received a 90-day supply.

## 2015-06-04 NOTE — ED Provider Notes (Signed)
-----------------------------------------   3:55 PM on 06/04/2015 -----------------------------------------   Blood pressure 156/68, pulse 83, temperature 98.3 F (36.8 C), temperature source Oral, resp. rate 14, height 5\' 3"  (1.6 m), weight 162 lb (73.483 kg), SpO2 98 %.  The patient had no acute events since last update.  Calm and cooperative at this time.  Seen and evaluated by Dr. Toni Amendlapacs. He called in the prescription to her known pharmacy. I will give her a prescription for antibiotics for her UTI. Family was contacted and will be picking of the patient at 5:30 PM.    Myrna Blazeravid Matthew Tiare Rohlman, MD 06/04/15 [phone]

## 2015-06-04 NOTE — ED Notes (Signed)
Ate all breakfast tray. 

## 2015-06-05 ENCOUNTER — Emergency Department
Admission: EM | Admit: 2015-06-05 | Discharge: 2015-06-06 | Disposition: A | Discharge status: Other Acute Inpatient Hospital | Payer: Medicare Other | Attending: Emergency Medicine | Admitting: Emergency Medicine

## 2015-06-05 ENCOUNTER — Encounter: Payer: Self-pay | Admitting: Emergency Medicine

## 2015-06-05 DIAGNOSIS — I10 Essential (primary) hypertension: Secondary | ICD-10-CM | POA: Insufficient documentation

## 2015-06-05 DIAGNOSIS — F22 Delusional disorders: Secondary | ICD-10-CM

## 2015-06-05 DIAGNOSIS — Z880 Allergy status to penicillin: Secondary | ICD-10-CM | POA: Insufficient documentation

## 2015-06-05 DIAGNOSIS — Z7984 Long term (current) use of oral hypoglycemic drugs: Secondary | ICD-10-CM | POA: Insufficient documentation

## 2015-06-05 DIAGNOSIS — E119 Type 2 diabetes mellitus without complications: Secondary | ICD-10-CM | POA: Insufficient documentation

## 2015-06-05 DIAGNOSIS — F209 Schizophrenia, unspecified: Secondary | ICD-10-CM

## 2015-06-05 DIAGNOSIS — F23 Brief psychotic disorder: Secondary | ICD-10-CM | POA: Insufficient documentation

## 2015-06-05 DIAGNOSIS — Z792 Long term (current) use of antibiotics: Secondary | ICD-10-CM | POA: Insufficient documentation

## 2015-06-05 DIAGNOSIS — Z79899 Other long term (current) drug therapy: Secondary | ICD-10-CM | POA: Insufficient documentation

## 2015-06-05 DIAGNOSIS — Z794 Long term (current) use of insulin: Secondary | ICD-10-CM | POA: Insufficient documentation

## 2015-06-05 LAB — COMPREHENSIVE METABOLIC PANEL
ALT: 12 U/L — ABNORMAL LOW (ref 14–54)
AST: 23 U/L (ref 15–41)
Albumin: 4.1 g/dL (ref 3.5–5.0)
Alkaline Phosphatase: 59 U/L (ref 38–126)
Anion gap: 15 (ref 5–15)
BUN: 19 mg/dL (ref 6–20)
CO2: 22 mmol/L (ref 22–32)
Calcium: 9.3 mg/dL (ref 8.9–10.3)
Chloride: 99 mmol/L — ABNORMAL LOW (ref 101–111)
Creatinine, Ser: 0.93 mg/dL (ref 0.44–1.00)
GFR calc Af Amer: >60 mL/min (ref >60)
GFR calc non Af Amer: 58 mL/min — ABNORMAL LOW (ref >60)
Glucose, Bld: 329 mg/dL — ABNORMAL HIGH (ref 65–99)
Potassium: 3.9 mmol/L (ref 3.5–5.1)
Sodium: 136 mmol/L (ref 135–145)
Total Bilirubin: 0.6 mg/dL (ref 0.3–1.2)
Total Protein: 7.8 g/dL (ref 6.5–8.1)

## 2015-06-05 LAB — ETHANOL: ALCOHOL ETHYL (B): 5 mg/dL — AB (ref <5)

## 2015-06-05 LAB — URINALYSIS COMPLETE WITH MICROSCOPIC (ARMC ONLY)
BACTERIA UA: NONE SEEN
BILIRUBIN URINE: NEGATIVE
Glucose, UA: 150 mg/dL — AB
HGB URINE DIPSTICK: NEGATIVE
Ketones, ur: NEGATIVE mg/dL
NITRITE: NEGATIVE
PH: 5 (ref 5.0–8.0)
Protein, ur: NEGATIVE mg/dL
Specific Gravity, Urine: 1.012 (ref 1.005–1.030)

## 2015-06-05 LAB — CBC WITH DIFFERENTIAL/PLATELET
Basophils Absolute: 0.1 10*3/uL (ref 0–0.1)
Basophils Relative: 1 %
Eosinophils Absolute: 0.2 10*3/uL (ref 0–0.7)
Eosinophils Relative: 2 %
HCT: 36.4 % (ref 35.0–47.0)
Hemoglobin: 12.2 g/dL (ref 12.0–16.0)
Lymphocytes Relative: 24 %
Lymphs Abs: 2.2 10*3/uL (ref 1.0–3.6)
MCH: 31.3 pg (ref 26.0–34.0)
MCHC: 33.4 g/dL (ref 32.0–36.0)
MCV: 93.7 fL (ref 80.0–100.0)
Monocytes Absolute: 0.6 10*3/uL (ref 0.2–0.9)
Monocytes Relative: 7 %
Neutro Abs: 6.2 10*3/uL (ref 1.4–6.5)
Neutrophils Relative %: 66 %
Platelets: 313 10*3/uL (ref 150–440)
RBC: 3.88 MIL/uL (ref 3.80–5.20)
RDW: 15.6 % — ABNORMAL HIGH (ref 11.5–14.5)
WBC: 9.3 10*3/uL (ref 3.6–11.0)

## 2015-06-05 LAB — URINE DRUG SCREEN, QUALITATIVE (ARMC ONLY)
Amphetamines, Ur Screen: NOT DETECTED
Barbiturates, Ur Screen: NOT DETECTED
Benzodiazepine, Ur Scrn: NOT DETECTED
CANNABINOID 50 NG, UR ~~LOC~~: NOT DETECTED
COCAINE METABOLITE, UR ~~LOC~~: NOT DETECTED
MDMA (ECSTASY) UR SCREEN: NOT DETECTED
Methadone Scn, Ur: NOT DETECTED
OPIATE, UR SCREEN: NOT DETECTED
Phencyclidine (PCP) Ur S: NOT DETECTED
TRICYCLIC, UR SCREEN: NOT DETECTED

## 2015-06-05 LAB — GLUCOSE, CAPILLARY: Glucose-Capillary: 179 mg/dL — ABNORMAL HIGH (ref 65–99)

## 2015-06-05 MED ORDER — FUROSEMIDE 40 MG PO TABS
20.0000 mg | ORAL_TABLET | Freq: Every day | ORAL | Status: DC
  Administered 2015-06-06: 20 mg via ORAL
  Filled 2015-06-05: qty 1

## 2015-06-05 MED ORDER — RANOLAZINE ER 500 MG PO TB12
500.0000 mg | ORAL_TABLET | Freq: Two times a day (BID) | ORAL | Status: DC
  Administered 2015-06-06 (×2): 500 mg via ORAL
  Filled 2015-06-05 (×3): qty 1

## 2015-06-05 MED ORDER — OLANZAPINE 10 MG PO TABS
10.0000 mg | ORAL_TABLET | Freq: Every day | ORAL | Status: DC
  Administered 2015-06-05: 10 mg via ORAL
  Filled 2015-06-05: qty 1

## 2015-06-05 MED ORDER — TRAZODONE HCL 100 MG PO TABS
100.0000 mg | ORAL_TABLET | Freq: Every day | ORAL | Status: DC
  Administered 2015-06-05: 100 mg via ORAL
  Filled 2015-06-05: qty 1

## 2015-06-05 MED ORDER — INSULIN ASPART PROT & ASPART (70-30 MIX) 100 UNIT/ML ~~LOC~~ SUSP: 20.0000 [IU] | Freq: Every day | SUBCUTANEOUS | Status: DC

## 2015-06-05 MED ORDER — PANTOPRAZOLE SODIUM 40 MG PO TBEC
40.0000 mg | DELAYED_RELEASE_TABLET | Freq: Every day | ORAL | Status: DC
  Administered 2015-06-06: 40 mg via ORAL
  Filled 2015-06-05: qty 1

## 2015-06-05 MED ORDER — METOPROLOL TARTRATE 50 MG PO TABS
50.0000 mg | ORAL_TABLET | Freq: Two times a day (BID) | ORAL | Status: DC
  Administered 2015-06-05 – 2015-06-06 (×2): 50 mg via ORAL
  Filled 2015-06-05 (×2): qty 1

## 2015-06-05 MED ORDER — METFORMIN HCL 500 MG PO TABS
1000.0000 mg | ORAL_TABLET | Freq: Two times a day (BID) | ORAL | Status: DC
  Administered 2015-06-05 – 2015-06-06 (×2): 1000 mg via ORAL
  Filled 2015-06-05 (×2): qty 2

## 2015-06-05 MED ORDER — POTASSIUM CHLORIDE CRYS ER 20 MEQ PO TBCR
20.0000 meq | EXTENDED_RELEASE_TABLET | Freq: Every day | ORAL | Status: DC
  Administered 2015-06-06: 20 meq via ORAL
  Filled 2015-06-05: qty 1

## 2015-06-05 MED ORDER — INSULIN ASPART PROT & ASPART (70-30 MIX) 100 UNIT/ML ~~LOC~~ SUSP
40.0000 [IU] | Freq: Every day | SUBCUTANEOUS | Status: DC
  Administered 2015-06-06: 40 [IU] via SUBCUTANEOUS
  Filled 2015-06-05: qty 40

## 2015-06-05 NOTE — [facility] Counselor (Signed)
Patient presented to the ED via EMS with complaints of  hallucinations. [facility]. Patient believes that men are trying to kill her.  Patient denies any HI/SI.  Recommend that patient be reassessed by Psych MD.

## 2015-06-05 NOTE — ED Provider Notes (Signed)
[facility] Emergency Department Provider Note  ____________________________________________   I have reviewed the triage vital signs and the nursing notes.   HISTORY  Chief Complaint Hallucinations    HPI Danielle Boyer is a 74 y.o. female Patient recently discharged from this facility after psychiatric stay, apparently patient has a history ofparanoid behavior. The patient was at home today and apparently called 911 several times. She tells me that there was someone who she knew was dead whose voice was telling her that he was going to common burner has to having killed her dog. She has no thoughts of harm her self or others but she is very concerned that these people that she can hear talking to her, one of whom she believes to have died several years ago, will cause her harm. She has no physical complaints.  Past Medical History  Diagnosis Date  . Diabetes mellitus without complication (HCC)   . Hypertension     Patient Active Problem List   Diagnosis Date Noted  . Psychosis 06/04/2015  . Hard of hearing 06/04/2015  . Bipolar I disorder, single manic episode, severe, with psychosis (HCC) 03/19/2015  . Diabetes (HCC) 03/19/2015  . Mild dementia 03/19/2015    Past Surgical History  Procedure Laterality Date  . Abdominal hysterectomy    . Cesarean section      x3    Current Outpatient Rx  Name  Route  Sig  Dispense  Refill  . furosemide (LASIX) 20 MG tablet   Oral   Take 20 mg by mouth daily.         . haloperidol (HALDOL) 0.5 MG tablet   Oral   Take 0.5 mg by mouth 2 (two) times daily.         . insulin NPH-regular Human (NOVOLIN 70/30) (70-30) 100 UNIT/ML injection   Subcutaneous   Inject 20-40 Units into the skin 2 (two) times daily. Pt uses 40 units in the morning and 20 units in the evening.         . metFORMIN (GLUCOPHAGE) 1000 MG tablet   Oral   Take 1,000 mg by mouth 2 (two) times daily.         . metoprolol (LOPRESSOR) 50  MG tablet   Oral   Take 50 mg by mouth 2 (two) times daily.         Marland Kitchen OLANZapine (ZYPREXA) 10 MG tablet   Oral   Take 1 tablet (10 mg total) by mouth at bedtime.   30 tablet   1   . omeprazole (PRILOSEC) 20 MG capsule   Oral   Take 20 mg by mouth 2 (two) times daily.         . potassium chloride SA (K-DUR,KLOR-CON) 20 MEQ tablet   Oral   Take 20 mEq by mouth daily.         . ranolazine (RANEXA) 500 MG 12 hr tablet   Oral   Take 500 mg by mouth 2 (two) times daily.         . traZODone (DESYREL) 100 MG tablet   Oral   Take 100 mg by mouth at bedtime.         . cephALEXin (KEFLEX) 500 MG capsule   Oral   Take 1 capsule (500 mg total) by mouth 3 (three) times daily.   21 capsule   0     Allergies Citalopram and Penicillins  Family History  Problem Relation Age of Onset  . Breast cancer  Sister     Social History Social History  Substance Use Topics  . Smoking status: Never Smoker   . Smokeless tobacco: None  . Alcohol Use: No    Review of Systems Constitutional: No fever/chills Eyes: No visual changes. ENT: No sore throat. No stiff neck no neck pain Cardiovascular: Denies chest pain. Respiratory: Denies shortness of breath. Gastrointestinal:   no vomiting.  No diarrhea.  No constipation. Genitourinary: Negative for dysuria. Musculoskeletal: Negative lower extremity swelling Skin: Negative for rash. Neurological: Negative for headaches, focal weakness or numbness. 10-point ROS otherwise negative.  ____________________________________________   PHYSICAL EXAM:  VITAL SIGNS: ED Triage Vitals  Enc Vitals Group     BP 06/05/15 1918 134/78 mmHg     Pulse Rate 06/05/15 1918 92     Resp 06/05/15 1918 16     Temp 06/05/15 1918 98.1 F (36.7 C)     Temp Source 06/05/15 1918 Oral     SpO2 06/05/15 1918 97 %     Weight --      Height --      Head Cir --      Peak Flow --      Pain Score 06/05/15 1918 0     Pain Loc --      Pain Edu? --       Excl. in GC? --     Constitutional: Alert and oriented to name and place but with rambling speech. Well appearing and in no acute distress. Eyes: Conjunctivae are normal. PERRL. EOMI. Head: Atraumatic. Nose: No congestion/rhinnorhea. Mouth/Throat: Mucous membranes are moist.  Oropharynx non-erythematous. Neck: No stridor.   Nontender with no meningismus Cardiovascular: Normal rate, regular rhythm. Grossly normal heart sounds.  Good peripheral circulation. Respiratory: Normal respiratory effort.  No retractions. Lungs CTAB. Abdominal: Soft and nontender. No distention. No guarding no rebound Back:  There is no focal tenderness or step off there is no midline tenderness there are no lesions noted. there is no CVA tenderness Musculoskeletal: No lower extremity tenderness. No joint effusions, no DVT signs strong distal pulses no edema Neurologic:  Normal speech and language. No gross focal neurologic deficits are appreciated.  Skin:  Skin is warm, dry and intact. No rash noted. Psychiatric: Mood and affect are normal. Speech and behavior are somewhat bizarre and manifesting paranoid ideation  ____________________________________________   LABS (all labs ordered are listed, but only abnormal results are displayed)  Labs Reviewed  CBC WITH DIFFERENTIAL/PLATELET - Abnormal; Notable for the following:    RDW 15.6 (*)    All other components within normal limits  COMPREHENSIVE METABOLIC PANEL  ETHANOL  URINE RAPID DRUG SCREEN, HOSP PERFORMED  URINALYSIS COMPLETEWITH MICROSCOPIC ([facility] ONLY)   ____________________________________________  EKG  I personally interpreted any EKGs ordered by me or triage  ____________________________________________  RADIOLOGY  I reviewed any imaging ordered by me or triage that were performed during my shift ____________________________________________   PROCEDURES  Procedure(s) performed: None  Critical Care performed:  None  ____________________________________________   INITIAL IMPRESSION / ASSESSMENT AND PLAN / ED COURSE  Pertinent labs & imaging results that were available during my care of the patient were reviewed by me and considered in my medical decision making (see chart for details).  Patient presents today complaining of having people out to get her she is paranoid and believes it someone is going to burn her house down and kill her dog. She believes these voices to be very similar to the voices of someone who died several  years ago. She has no complaints at this time otherwise and she is quite amenable to our keeping her safe here. Patient will likely require involuntary commitment. We will recheck blood work and urine and reassess ____________________________________________   FINAL CLINICAL IMPRESSION(S) / ED DIAGNOSES  Final diagnoses:  None     Jeanmarie PlantJames A McShane, MD 06/05/15 2019

## 2015-06-05 NOTE — ED Notes (Signed)
[facility] ED with c/o hallucinations. [facility]. Patient has called 911 multiple times since d/c stating "a bald man is coming to kill me at midnight tonight". Patient's hallucinations have primarily been associated with men trying to kill her. Denies HI/SI

## 2015-06-06 ENCOUNTER — Inpatient Hospital Stay
Admission: EM | Admit: 2015-06-06 | Discharge: 2015-06-12 | DRG: 885 | Disposition: A | Discharge status: Home / Self Care | Payer: 59 | Attending: Psychiatry | Admitting: Psychiatry

## 2015-06-06 DIAGNOSIS — E11649 Type 2 diabetes mellitus with hypoglycemia without coma: Secondary | ICD-10-CM | POA: Diagnosis present

## 2015-06-06 DIAGNOSIS — Z794 Long term (current) use of insulin: Secondary | ICD-10-CM | POA: Diagnosis not present

## 2015-06-06 DIAGNOSIS — Z9114 Patient's other noncompliance with medication regimen: Secondary | ICD-10-CM | POA: Diagnosis not present

## 2015-06-06 DIAGNOSIS — Z9889 Other specified postprocedural states: Secondary | ICD-10-CM

## 2015-06-06 DIAGNOSIS — I4891 Unspecified atrial fibrillation: Secondary | ICD-10-CM | POA: Diagnosis present

## 2015-06-06 DIAGNOSIS — K589 Irritable bowel syndrome without diarrhea: Secondary | ICD-10-CM | POA: Diagnosis present

## 2015-06-06 DIAGNOSIS — Z803 Family history of malignant neoplasm of breast: Secondary | ICD-10-CM | POA: Diagnosis not present

## 2015-06-06 DIAGNOSIS — Z90710 Acquired absence of both cervix and uterus: Secondary | ICD-10-CM | POA: Diagnosis not present

## 2015-06-06 DIAGNOSIS — G3184 Mild cognitive impairment, so stated: Secondary | ICD-10-CM | POA: Diagnosis present

## 2015-06-06 DIAGNOSIS — R05 Cough: Secondary | ICD-10-CM

## 2015-06-06 DIAGNOSIS — E119 Type 2 diabetes mellitus without complications: Secondary | ICD-10-CM

## 2015-06-06 DIAGNOSIS — Z79899 Other long term (current) drug therapy: Secondary | ICD-10-CM

## 2015-06-06 DIAGNOSIS — F209 Schizophrenia, unspecified: Secondary | ICD-10-CM | POA: Diagnosis present

## 2015-06-06 DIAGNOSIS — G4700 Insomnia, unspecified: Secondary | ICD-10-CM | POA: Diagnosis present

## 2015-06-06 DIAGNOSIS — E785 Hyperlipidemia, unspecified: Secondary | ICD-10-CM | POA: Diagnosis present

## 2015-06-06 DIAGNOSIS — I25119 Atherosclerotic heart disease of native coronary artery with unspecified angina pectoris: Secondary | ICD-10-CM | POA: Diagnosis present

## 2015-06-06 DIAGNOSIS — K219 Gastro-esophageal reflux disease without esophagitis: Secondary | ICD-10-CM | POA: Diagnosis present

## 2015-06-06 DIAGNOSIS — Z880 Allergy status to penicillin: Secondary | ICD-10-CM

## 2015-06-06 DIAGNOSIS — F22 Delusional disorders: Secondary | ICD-10-CM | POA: Diagnosis present

## 2015-06-06 DIAGNOSIS — Z7982 Long term (current) use of aspirin: Secondary | ICD-10-CM | POA: Diagnosis not present

## 2015-06-06 DIAGNOSIS — Z9049 Acquired absence of other specified parts of digestive tract: Secondary | ICD-10-CM

## 2015-06-06 DIAGNOSIS — M1990 Unspecified osteoarthritis, unspecified site: Secondary | ICD-10-CM | POA: Diagnosis present

## 2015-06-06 DIAGNOSIS — Z955 Presence of coronary angioplasty implant and graft: Secondary | ICD-10-CM

## 2015-06-06 DIAGNOSIS — I10 Essential (primary) hypertension: Secondary | ICD-10-CM | POA: Diagnosis present

## 2015-06-06 DIAGNOSIS — E039 Hypothyroidism, unspecified: Secondary | ICD-10-CM | POA: Diagnosis present

## 2015-06-06 DIAGNOSIS — Z888 Allergy status to other drugs, medicaments and biological substances status: Secondary | ICD-10-CM | POA: Diagnosis not present

## 2015-06-06 DIAGNOSIS — F200 Paranoid schizophrenia: Secondary | ICD-10-CM | POA: Diagnosis not present

## 2015-06-06 DIAGNOSIS — R609 Edema, unspecified: Secondary | ICD-10-CM | POA: Diagnosis present

## 2015-06-06 DIAGNOSIS — H9190 Unspecified hearing loss, unspecified ear: Secondary | ICD-10-CM

## 2015-06-06 DIAGNOSIS — E876 Hypokalemia: Secondary | ICD-10-CM | POA: Diagnosis present

## 2015-06-06 DIAGNOSIS — R059 Cough, unspecified: Secondary | ICD-10-CM

## 2015-06-06 DIAGNOSIS — I2510 Atherosclerotic heart disease of native coronary artery without angina pectoris: Secondary | ICD-10-CM

## 2015-06-06 LAB — LIPID PANEL
CHOLESTEROL: 236 mg/dL — AB (ref 0–200)
HDL: 38 mg/dL — ABNORMAL LOW (ref >40)
LDL CALC: 121 mg/dL — AB (ref 0–99)
TRIGLYCERIDES: 387 mg/dL — AB (ref <150)
Total CHOL/HDL Ratio: 6.2 RATIO
VLDL: 77 mg/dL — ABNORMAL HIGH (ref 0–40)

## 2015-06-06 LAB — GLUCOSE, CAPILLARY
Glucose-Capillary: 223 mg/dL — ABNORMAL HIGH (ref 65–99)
Glucose-Capillary: 205 mg/dL — ABNORMAL HIGH (ref 65–99)
Glucose-Capillary: 120 mg/dL — ABNORMAL HIGH (ref 65–99)
Glucose-Capillary: 111 mg/dL — ABNORMAL HIGH (ref 65–99)

## 2015-06-06 LAB — GLUCOSE, RANDOM: GLUCOSE: 126 mg/dL — AB (ref 65–99)

## 2015-06-06 LAB — TSH: TSH: 10.236 u[IU]/mL — ABNORMAL HIGH (ref 0.350–4.500)

## 2015-06-06 MED ORDER — OLANZAPINE 10 MG PO TABS
10.0000 mg | ORAL_TABLET | Freq: Every day | ORAL | Status: DC
  Administered 2015-06-06 – 2015-06-11 (×6): 10 mg via ORAL
  Filled 2015-06-06 (×6): qty 1

## 2015-06-06 MED ORDER — POTASSIUM CHLORIDE CRYS ER 20 MEQ PO TBCR
20.0000 meq | EXTENDED_RELEASE_TABLET | Freq: Every day | ORAL | Status: DC
  Administered 2015-06-07 – 2015-06-12 (×6): 20 meq via ORAL
  Filled 2015-06-06 (×6): qty 1

## 2015-06-06 MED ORDER — ACETAMINOPHEN 325 MG PO TABS: 650.0000 mg | ORAL_TABLET | Freq: Four times a day (QID) | ORAL | Status: DC | PRN

## 2015-06-06 MED ORDER — METOPROLOL TARTRATE 25 MG PO TABS
50.0000 mg | ORAL_TABLET | Freq: Two times a day (BID) | ORAL | Status: DC
  Administered 2015-06-06 – 2015-06-12 (×11): 50 mg via ORAL
  Filled 2015-06-06 (×12): qty 2

## 2015-06-06 MED ORDER — MAGNESIUM HYDROXIDE 400 MG/5ML PO SUSP: 30.0000 mL | Freq: Every day | ORAL | Status: DC | PRN

## 2015-06-06 MED ORDER — METFORMIN HCL 500 MG PO TABS
1000.0000 mg | ORAL_TABLET | Freq: Two times a day (BID) | ORAL | Status: DC
  Administered 2015-06-06 – 2015-06-10 (×8): 1000 mg via ORAL
  Filled 2015-06-06 (×8): qty 2

## 2015-06-06 MED ORDER — INSULIN ASPART PROT & ASPART (70-30 MIX) 100 UNIT/ML ~~LOC~~ SUSP
20.0000 [IU] | Freq: Every day | SUBCUTANEOUS | Status: DC
  Administered 2015-06-06 – 2015-06-09 (×4): 20 [IU] via SUBCUTANEOUS
  Filled 2015-06-06 (×3): qty 20

## 2015-06-06 MED ORDER — INSULIN ASPART PROT & ASPART (70-30 MIX) 100 UNIT/ML ~~LOC~~ SUSP
40.0000 [IU] | Freq: Every day | SUBCUTANEOUS | Status: DC
  Administered 2015-06-07 – 2015-06-08 (×2): 40 [IU] via SUBCUTANEOUS
  Filled 2015-06-06 (×2): qty 40

## 2015-06-06 MED ORDER — PANTOPRAZOLE SODIUM 40 MG PO TBEC
40.0000 mg | DELAYED_RELEASE_TABLET | Freq: Every day | ORAL | Status: DC
  Administered 2015-06-07 – 2015-06-12 (×6): 40 mg via ORAL
  Filled 2015-06-06 (×6): qty 1

## 2015-06-06 MED ORDER — ALUM & MAG HYDROXIDE-SIMETH 200-200-20 MG/5ML PO SUSP
30.0000 mL | ORAL | Status: DC | PRN
Start: 2015-06-06 — End: 2015-06-12

## 2015-06-06 MED ORDER — TRAZODONE HCL 100 MG PO TABS
100.0000 mg | ORAL_TABLET | Freq: Every day | ORAL | Status: DC
  Administered 2015-06-06 – 2015-06-11 (×6): 100 mg via ORAL
  Filled 2015-06-06 (×6): qty 1

## 2015-06-06 MED ORDER — RANOLAZINE ER 500 MG PO TB12
500.0000 mg | ORAL_TABLET | Freq: Two times a day (BID) | ORAL | Status: DC
  Administered 2015-06-06 – 2015-06-12 (×12): 500 mg via ORAL
  Filled 2015-06-06 (×13): qty 1

## 2015-06-06 MED ORDER — FUROSEMIDE 40 MG PO TABS
20.0000 mg | ORAL_TABLET | Freq: Every day | ORAL | Status: DC
  Administered 2015-06-07 – 2015-06-12 (×6): 20 mg via ORAL
  Filled 2015-06-06 (×6): qty 1

## 2015-06-06 NOTE — ED Notes (Signed)

## 2015-06-06 NOTE — ED Notes (Signed)
Am meds administered as ordered  Pt has been resting  Clapacs has consulted and [facility]  Assessment completed  She denies pain  Continue to monitor

## 2015-06-06 NOTE — ED Notes (Addendum)
breakfast provided  Pt observed lying in bed    Pt visualized with NAD  No verbalized needs or concerns at this time  Continue to monitor

## 2015-06-06 NOTE — Progress Notes (Signed)
Inpatient Diabetes Program Recommendations  AACE/ADA: New Consensus Statement on Inpatient Glycemic Control (2015)  Target Ranges:  Prepandial:   less than 140 mg/dL      Peak postprandial:   less than 180 mg/dL (1-2 hours)      Critically ill patients:  140 - 180 mg/dL   Review of Glycemic Control  Results for [name], Davene M (MRN [id number]) as of 06/06/2015 13:20  Ref. Range 03/19/2015 20:59 06/05/2015 23:25 06/06/2015 05:25 06/06/2015 07:15 06/06/2015 11:27  Glucose-Capillary Latest Ref Range: 65-99 mg/dL 440171 (H) 102179 (H) 725223 (H) 205 (H) 120 (H)    Diabetes history: Type 2 Outpatient Diabetes medications: Humulin 40 units qam, 20 units qpm, Metformin 1000mg  bid Current orders for Inpatient glycemic control: Humulin 40 units qam, 20 units qpm, Metformin 1000mg  bid  Inpatient Diabetes Program Recommendations: Agree with current medication for diabetes management.  Susette RacerJulie Shaiann Mcmanamon, RN, BA, MHA, CDE Diabetes Coordinator Inpatient Diabetes Program  [phone] (Team Pager) [phone] [facility] Office) 06/06/2015 1:23 PM

## 2015-06-06 NOTE — Progress Notes (Signed)
D: Patient very confused. Patient stated somebody was talking about her and she didn't like it. Patient also constantly asks if we're going to be staying the night or leaving her. She appears anxious and worries. Patient hard of hearing. Denies SI/HI/AVH but appears to be reacting to internal stimuli. Patient stated pain at an 8 in neck but refused medication.  A: Medication was given with education. Notes were given to patient to read because of hearing issues.  R: Patient was cooperative with medication and has been very pleasant. Safety maintained with 15 min checks.

## 2015-06-06 NOTE — ED Notes (Signed)
BEHAVIORAL HEALTH ROUNDING Patient sleeping: No. Patient alert and oriented: yes Behavior appropriate: Yes.  ; If no, describe:  Nutrition and fluids offered: yes Toileting and hygiene offered: Yes  Sitter present: q15 minute observations and security camera monitoring Law enforcement present: Yes  ODS  

## 2015-06-06 NOTE — ED Notes (Addendum)
CBG-[id number], Dr. Lenard LancePaduchowski notified

## 2015-06-06 NOTE — Progress Notes (Signed)
Recreation Therapy Notes  Date: 12.01.16 Time: 3:00 pm Location: Craft Room  Group Topic: Leisure Education  Goal Area(s) Addresses:  Patient will identify activities for each letter of the alphabet. Patient will verbalize ability to integrate positive leisure into life post d/c. Patient will verbalize ability to use leisure as a Associate Professorcoping skill.  Behavioral Response: Did not attend  Intervention: Leisure Alphabet  Activity: Patients were given a leisure alphabet worksheet and instructed to write positive leisure activities for each letter of the alphabet.  Education: LRT educated patients on what they need to participate in leisure  Education Outcome: Patient did not attend group.   Clinical Observations/Feedback: Patient did not attend group.  Jacquelynn CreeGreene,Makyra Corprew M, LRT/CTRS 06/06/2015 3:50 PM

## 2015-06-06 NOTE — BH Assessment (Signed)
Pt. is to be [facility] by Dr. Toni Amendlapacs. Attending Physician will be Dr. Ardyth HarpsHernandez.  Pt. has been assigned to room 301, [facility] Charge Nurse Marylu LundJanet.  Intake Paper Work has been signed and placed on pt. chart. ER staff Gl[place]on Axe(Luanne, ER Sect.; Dr. Silverio LayYao, ER MD; Murlean IbaAmy T., Patient's Nurse & Neysa Bonitohristy, Patient Access) have been made aware of the admission.

## 2015-06-06 NOTE — Tx Team (Signed)
Initial Interdisciplinary Treatment Plan   PATIENT STRESSORS: Health problems Medication change or noncompliance   PATIENT STRENGTHS: Active sense of humor Average or above average intelligence Supportive family/friends   PROBLEM LIST: Problem List/Patient Goals Date to be addressed Date deferred Reason deferred Estimated date of resolution  Auditory hallucinations 06/06/2015     Medication compliance 06/06/2015                                                DISCHARGE CRITERIA:  Improved stabilization in mood, thinking, and/or behavior  PRELIMINARY DISCHARGE PLAN: Return to previous living arrangement  PATIENT/FAMIILY INVOLVEMENT: This treatment plan has been presented to and reviewed with the patient, Danielle Boyer, and/or family member.  The patient and family have been given the opportunity to ask questions and make suggestions.  Danielle Boyer, Danielle Boyer 06/06/2015, 2:48 PM

## 2015-06-06 NOTE — ED Notes (Signed)
Report called to [facility]  - Gwen RN   Pt to transfer to inpt admission at this time    Pt observed with no unusual behavior  Appropriate to stimulation  No verbalized needs or concerns at this time  NAD assessed  Continue to monitor

## 2015-06-06 NOTE — [facility] Group Notes (Signed)
[facility] Group Notes:  (Nursing/MHT/Case Management/Adjunct)  Date:  06/06/2015  Time:  1:58 PM  Type of Therapy:  Group Therapy  Participation Level:  Did Not Attend   Yariah Selvey De'Chelle Shelton Soler 06/06/2015, 1:58 PM

## 2015-06-06 NOTE — ED Notes (Signed)
BEHAVIORAL HEALTH ROUNDING Patient sleeping: Yes.   Patient alert and oriented: eyes closed  Appears asleep Behavior appropriate: Yes.  ; If no, describe:  Nutrition and fluids offered: Yes  Toileting and hygiene offered: sleeping Sitter present: q 15 minute observations and security camera monitoring Law enforcement present: yes  ODS 

## 2015-06-06 NOTE — ED Notes (Signed)
Lunch provided.

## 2015-06-06 NOTE — Consult Note (Signed)
[facility] Face-to-Face Psychiatry Consult   Reason for Consult:  Consult for this 74 year old woman with a history of recurrent psychotic symptoms who presents back to the hospital once again having hallucinations Referring Physician:  [place] Patient Identification: Danielle Boyer MRN:  [id number] Principal Diagnosis: Schizophrenia [facility]) Diagnosis:   Patient Active Problem List   Diagnosis Date Noted  . Schizophrenia ([place]) [F20.9] 06/06/2015  . Psychosis [F29] 06/04/2015  . 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 Bipolar I disorder, single manic episode, severe, with psychosis ([place]) [F30.2] 03/19/2015  . Diabetes ([place]) [E11.9] 03/19/2015  . 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 female patient admitted with "these people of been calling me up again".  HPI:  This a 74 year old woman well known to [place] the psychiatry service. Patient interviewed. Chart reviewed. I just saw the patient a couple days ago in the emergency room under similar circumstances. She comes back saying that there are people who are calling her up although she then corrects herself and says that they don't actually use a telephone they just speak to her through the roof of her house. There are various voices and they say things that are upsetting to her at times threatening to kill her or kill her dog. She hasn't been sleeping well. Her mood is anxious and nervous. Patient claims that she's been compliant with her medicine but isn't really able to detail what she takes. Denies that she's been abusing substances. Denies any specific new psychosocial trauma. Patient was here in the emergency room earlier in the week with the same symptoms. At that time I sent her home with a renewal of antipsychotic medication but obviously she is not able to continue to stay there in her current condition.  Medical history: Patient has a history of diabetes managed with  oral medication and diet. Current blood sugars in the high 100-200 or so. She has had recurrent urinary tract infections.  Social history: Patient lives by herself with her dog. She does have children who are supportive of her  Substance abuse history: Patient does not have a significant substance abuse history and is not currently abusing any substances  Past Psychiatric History: Patient has had a long history of this kind of problem. She presented multiple times with similar symptoms. Her psychosis usually is auditory hallucinations with threatening content to them. She's been diagnosed with bipolar disorder. Sounds little bit more like schizophrenia to me as the really doesn't seem to be an obvious mood component. Not entirely clear whether it was going on when she was younger. In any case its a recurrent issue now. I believe that she used to see Dr. Mamie Nick but I don't think that she has been compliant recently. It is still unclear to me what medicine she was taking at home.    Risk to Self: Is patient at risk for suicide?: No Risk to Others:   Prior Inpatient Therapy:   Prior Outpatient Therapy:    Past Medical History:  Past Medical History  Diagnosis Date  . Diabetes mellitus without complication ([place])   . Hypertension     Past Surgical History  Procedure Laterality Date  . Abdominal hysterectomy    . Cesarean section      x3   Family History:  Family History  Problem Relation Age of Onset  . Breast cancer Sister    Family Psychiatric  History: There  is no known family history of mental illness or substance abuse problems Social History:  History  Alcohol Use No     History  Drug Use Not on file    Social History   Social History  . Marital Status: Widowed    Spouse Name: N/A  . Number of Children: N/A  . Years of Education: N/A   Social History Main Topics  . Smoking status: Never Smoker   . Smokeless tobacco: None  . Alcohol Use: No  . Drug Use: None  . Sexual  Activity: Not Asked   Other Topics Concern  . None   Social History Narrative   Additional Social History:                          Allergies:   Allergies  Allergen Reactions  . Citalopram Other (See Comments)    Reaction:  Unknown   . Penicillins Rash and Other (See Comments)    Unable to obtain enough information to answer additional questions about this medication.      Labs:  Results for orders placed or performed during the hospital encounter of 06/05/15 (from the past 48 hour(s))  Comprehensive metabolic panel     Status: Abnormal   Collection Time: 06/05/15  7:32 PM  Result Value Ref Range   Sodium 136 135 - 145 mmol/L   Potassium 3.9 3.5 - 5.1 mmol/L   Chloride 99 (L) 101 - 111 mmol/L   CO2 22 22 - 32 mmol/L   Glucose, Bld 329 (H) 65 - 99 mg/dL   BUN 19 6 - 20 mg/dL   Creatinine, Ser 0.93 0.44 - 1.00 mg/dL   Calcium 9.3 8.9 - 10.3 mg/dL   Total Protein 7.8 6.5 - 8.1 g/dL   Albumin 4.1 3.5 - 5.0 g/dL   AST 23 15 - 41 U/L   ALT 12 (L) 14 - 54 U/L   Alkaline Phosphatase 59 38 - 126 U/L   Total Bilirubin 0.6 0.3 - 1.2 mg/dL   GFR calc non Af Amer 58 (L) >60 mL/min   GFR calc Af Amer >60 >60 mL/min    Comment: (NOTE) The eGFR has been calculated using the CKD EPI equation. This calculation has not been validated in all clinical situations. eGFR's persistently <60 mL/min signify possible Chronic Kidney Disease.    Anion gap 15 5 - 15  Ethanol     Status: Abnormal   Collection Time: 06/05/15  7:32 PM  Result Value Ref Range   Alcohol, Ethyl (B) 5 (H) <5 mg/dL    Comment:        LOWEST DETECTABLE LIMIT FOR SERUM ALCOHOL IS 5 mg/dL FOR MEDICAL PURPOSES ONLY   CBC with Diff     Status: Abnormal   Collection Time: 06/05/15  7:32 PM  Result Value Ref Range   WBC 9.3 3.6 - 11.0 K/uL   RBC 3.88 3.80 - 5.20 MIL/uL   Hemoglobin 12.2 12.0 - 16.0 g/dL   HCT 36.4 35.0 - 47.0 %   MCV 93.7 80.0 - 100.0 fL   MCH 31.3 26.0 - 34.0 pg   MCHC 33.4 32.0 - 36.0  g/dL   RDW 15.6 (H) 11.5 - 14.5 %   Platelets 313 150 - 440 K/uL   Neutrophils Relative % 66 %   Neutro Abs 6.2 1.4 - 6.5 K/uL   Lymphocytes Relative 24 %   Lymphs Abs 2.2 1.0 - 3.6 K/uL   Monocytes  Relative 7 %   Monocytes Absolute 0.6 0.2 - 0.9 K/uL   Eosinophils Relative 2 %   Eosinophils Absolute 0.2 0 - 0.7 K/uL   Basophils Relative 1 %   Basophils Absolute 0.1 0 - 0.1 K/uL  Urinalysis complete, with microscopic ([facility] only)     Status: Abnormal   Collection Time: 06/05/15 10:52 PM  Result Value Ref Range   Color, Urine YELLOW (A) YELLOW   APPearance CLEAR (A) CLEAR   Glucose, UA 150 (A) NEGATIVE mg/dL   Bilirubin Urine NEGATIVE NEGATIVE   Ketones, ur NEGATIVE NEGATIVE mg/dL   Specific Gravity, Urine 1.012 1.005 - 1.030   Hgb urine dipstick NEGATIVE NEGATIVE   pH 5.0 5.0 - 8.0   Protein, ur NEGATIVE NEGATIVE mg/dL   Nitrite NEGATIVE NEGATIVE   Leukocytes, UA 1+ (A) NEGATIVE   RBC / HPF 0-5 0 - 5 RBC/hpf   WBC, UA 0-5 0 - 5 WBC/hpf   Bacteria, UA NONE SEEN NONE SEEN   Squamous Epithelial / LPF 0-5 (A) NONE SEEN   Mucous PRESENT    Hyaline Casts, UA PRESENT   Urine Drug Screen, Qualitative ([facility] only)     Status: None   Collection Time: 06/05/15 10:52 PM  Result Value Ref Range   Tricyclic, Ur Screen NONE DETECTED NONE DETECTED   Amphetamines, Ur Screen NONE DETECTED NONE DETECTED   MDMA (Ecstasy)Ur Screen NONE DETECTED NONE DETECTED   Cocaine Metabolite,Ur [place] NONE DETECTED NONE DETECTED   Opiate, Ur Screen NONE DETECTED NONE DETECTED   Phencyclidine (PCP) Ur S NONE DETECTED NONE DETECTED   Cannabinoid 50 Ng, Ur [place] NONE DETECTED NONE DETECTED   Barbiturates, Ur Screen NONE DETECTED NONE DETECTED   Benzodiazepine, Ur Scrn NONE DETECTED NONE DETECTED   Methadone Scn, Ur NONE DETECTED NONE DETECTED    Comment: (NOTE) 979  Tricyclics, urine               Cutoff 1000 ng/mL 200  Amphetamines, urine             Cutoff 1000 ng/mL 300  MDMA (Ecstasy), urine           Cutoff  500 ng/mL 400  Cocaine Metabolite, urine       Cutoff 300 ng/mL 500  Opiate, urine                   Cutoff 300 ng/mL 600  Phencyclidine (PCP), urine      Cutoff 25 ng/mL 700  Cannabinoid, urine              Cutoff 50 ng/mL 800  Barbiturates, urine             Cutoff 200 ng/mL 900  Benzodiazepine, urine           Cutoff 200 ng/mL 1000 Methadone, urine                Cutoff 300 ng/mL 1100 1200 The urine drug screen provides only a preliminary, unconfirmed 1300 analytical test result and should not be used for non-medical 1400 purposes. Clinical consideration and professional judgment should 1500 be applied to any positive drug screen result due to possible 1600 interfering substances. A more specific alternate chemical method 1700 must be used in order to obtain a confirmed analytical result.  1800 Gas chromato graphy / mass spectrometry (GC/MS) is the preferred 1900 confirmatory method.   Glucose, capillary     Status: Abnormal   Collection Time: 06/05/15 11:25 PM  Result  Value Ref Range   Glucose-Capillary 179 (H) 65 - 99 mg/dL  Glucose, capillary     Status: Abnormal   Collection Time: 06/06/15  5:25 AM  Result Value Ref Range   Glucose-Capillary 223 (H) 65 - 99 mg/dL  Glucose, capillary     Status: Abnormal   Collection Time: 06/06/15  7:15 AM  Result Value Ref Range   Glucose-Capillary 205 (H) 65 - 99 mg/dL  Glucose, capillary     Status: Abnormal   Collection Time: 06/06/15 11:27 AM  Result Value Ref Range   Glucose-Capillary 120 (H) 65 - 99 mg/dL    Current Facility-Administered Medications  Medication Dose Route Frequency Provider Last Rate Last Dose  . furosemide (LASIX) tablet 20 mg  20 mg Oral Daily Schuyler Amor, MD      . insulin aspart protamine- aspart (NOVOLOG MIX 70/30) injection 20 Units  20 Units Subcutaneous Q supper Schuyler Amor, MD      . insulin aspart protamine- aspart (NOVOLOG MIX 70/30) injection 40 Units  40 Units Subcutaneous Q breakfast Schuyler Amor, MD   40 Units at 06/06/15 0759  . metFORMIN (GLUCOPHAGE) tablet 1,000 mg  1,000 mg Oral BID Schuyler Amor, MD   1,000 mg at 06/05/15 2354  . metoprolol (LOPRESSOR) tablet 50 mg  50 mg Oral BID Schuyler Amor, MD   50 mg at 06/05/15 2354  . OLANZapine (ZYPREXA) tablet 10 mg  10 mg Oral QHS Schuyler Amor, MD   10 mg at 06/05/15 2353  . pantoprazole (PROTONIX) EC tablet 40 mg  40 mg Oral Daily Schuyler Amor, MD      . potassium chloride SA (K-DUR,KLOR-CON) CR tablet 20 mEq  20 mEq Oral Daily Schuyler Amor, MD      . ranolazine (RANEXA) 12 hr tablet 500 mg  500 mg Oral BID Schuyler Amor, MD   500 mg at 06/06/15 0035  . traZODone (DESYREL) tablet 100 mg  100 mg Oral QHS Schuyler Amor, MD   100 mg at 06/05/15 2354   Current Outpatient Prescriptions  Medication Sig Dispense Refill  . furosemide (LASIX) 20 MG tablet Take 20 mg by mouth daily.    . haloperidol (HALDOL) 0.5 MG tablet Take 0.5 mg by mouth 2 (two) times daily.    . insulin NPH-regular Human (HUMULIN 70/30) (70-30) 100 UNIT/ML injection Inject 20-40 Units into the skin 2 (two) times daily. Pt uses 40 units in the morning and 20 units in the evening.    . metFORMIN (GLUCOPHAGE) 1000 MG tablet Take 1,000 mg by mouth 2 (two) times daily.    . metoprolol (LOPRESSOR) 50 MG tablet Take 50 mg by mouth 2 (two) times daily.    . nitroGLYCERIN (NITROSTAT) 0.4 MG SL tablet Place 0.4 mg under the tongue every 5 (five) minutes as needed for chest pain.    Marland Kitchen OLANZapine (ZYPREXA) 10 MG tablet Take 1 tablet (10 mg total) by mouth at bedtime. 30 tablet 1  . omeprazole (PRILOSEC) 20 MG capsule Take 20 mg by mouth 2 (two) times daily.    . potassium chloride SA (K-DUR,KLOR-CON) 20 MEQ tablet Take 20 mEq by mouth daily.    . ranolazine (RANEXA) 500 MG 12 hr tablet Take 500 mg by mouth 2 (two) times daily.    . traZODone (DESYREL) 100 MG tablet Take 100 mg by mouth at bedtime.    . cephALEXin (KEFLEX) 500 MG capsule Take 1 capsule (500  mg total) by mouth 3 (three) times daily. (Patient not taking: Reported on 06/05/2015) 21 capsule 0    Musculoskeletal: Strength & Muscle Tone: within normal limits Gait & Station: normal Patient leans: N/A  Psychiatric Specialty Exam: Review of Systems  Constitutional: Negative.   HENT: Negative.   Eyes: Negative.   Respiratory: Negative.   Cardiovascular: Negative.   Gastrointestinal: Negative.   Musculoskeletal: Negative.   Skin: Negative.   Neurological: Negative.   Psychiatric/Behavioral: Positive for hallucinations and memory loss. Negative for depression and suicidal ideas. The patient is nervous/anxious and has insomnia.     Blood pressure 118/71, pulse 71, temperature 97.4 F (36.3 C), temperature source Oral, resp. rate 18, SpO2 97 %.There is no weight on file to calculate BMI.  General Appearance: Disheveled  Eye Sport and exercise psychologist::  Fair  Speech:  Clear and Coherent and Slow  Volume:  Decreased  Mood:  Anxious  Affect:  Constricted  Thought Process:  Circumstantial and Linear  Orientation:  Full (Time, Place, and Person)  Thought Content:  Hallucinations: Auditory  Suicidal Thoughts:  No  Homicidal Thoughts:  No  Memory:  Immediate;   Fair Recent;   Fair Remote;   Fair  Judgement:  Impaired  Insight:  Shallow  Psychomotor Activity:  Decreased  Concentration:  Fair  Recall:  [employer] of Knowledge:Fair  Language: Fair  Akathisia:  No  Handed:  Right  AIMS (if indicated):     Assets:  Desire for Improvement Financial Resources/Insurance Housing Physical Health Resilience Social Support  ADL's:  Intact  Cognition: Impaired,  Mild  Sleep:      Treatment Plan Summary: Daily contact with patient to assess and evaluate symptoms and progress in treatment, Medication management and Plan This a 74 year old woman with active psychotic symptoms. Medically she's pretty stable. Although she is elderly she is ambulatory and doesn't require any remarkable nursing care.  When she is in her right mind she is able to live independently. Because of this I think she is appropriate to admit to our psychiatry ward. Additionally she is familiar with [place] and we are familiar with her. Patient is to be continued on her current Zyprexa and her medicines may need some tweaking downstairs. Clearly she is not able to be treated as an outpatient right now because her symptoms seem to be escalating and she is getting more nervous about it. Patient's diabetes is under only partial control. I continued her metformin. We will do regular fingerstick checks and perhaps that can be affected as well. Low-carb diet. Patient is hard of hearing and this makes it a little difficult to communicate with her. She have to shout and look her straight in the eye for her to understand. I don't know if it's possible to get her daughter to bring over one of her hearing aids.  Disposition: Recommend psychiatric Inpatient admission when medically cleared. Supportive therapy provided about ongoing stressors. Discussed crisis plan, support from social network, calling 911, coming to the Emergency Department, and calling Suicide Hotline.  Dela Sweeny 06/06/2015 11:33 AM

## 2015-06-06 NOTE — Progress Notes (Addendum)
Ms. Danielle Boyer was admitted and oriented to unit. Pt was somewhat difficult to assess -- she has lost her hearing aids and struggles to hear conversations -- but the complains of hearing voices, denies SI/HI, and current pain. Pt AOX3 and cooperative. Skin assessment showed only an old, healed surgical scar to the abdomen. She has dentures and ambulates independently. Demonstrated to pt how she could use call [place]; she voiced understanding. Introduced pt to milieu and gave fluids. Pt says she has been eating well. She denies smoking, drinking alcohol, or using illicit drugs. She reports her son Clide CliffRicky is her power of attorney. Pt remains safe on unit. Will continue to monitor for needs/safety.   Pt has valuables stored in safe. Note yellow sheet with key in chart.

## 2015-06-06 NOTE — ED Notes (Signed)
Patient observed lying in bed with eyes closed  Even, unlabored respirations observed   NAD pt appears to be sleeping  I will continue to monitor along with every 15 minute visual observations and ongoing security camera monitoring    

## 2015-06-06 NOTE — ED Notes (Signed)
ED BHU PLACEMENT JUSTIFICATION Is the patient under IVC or is there intent for IVC:  Yes Is the patient medically cleared: Yes.   Is there vacancy in the ED BHU:  Is the population mix appropriate for patient: Yes.   Is the patient awaiting placement in inpatient or outpatient setting:   [facility] Has the patient had a psychiatric consult: consult complete Survey of unit performed for contraband, proper placement and condition of furniture, tampering with fixtures in bathroom, shower, and each patient room: Yes.  ; Findings:  APPEARANCE/BEHAVIOR Calm and cooperative NEURO ASSESSMENT Orientation:  Oriented x3 Hallucinations: No.None noted (Hallucinations) Speech: Normal Gait: normal RESPIRATORY ASSESSMENT Even  unlabored respirations noted  CARDIOVASCULAR ASSESSMENT Regular rate  Pulses equal  Skin warm and dry   GASTROINTESTINAL ASSESSMENT no GI complaint EXTREMITIES Full ROM  PLAN OF CARE Provide calm/safe environment. Vital signs assessed twice daily. ED BHU Assessment once each 12-hour shift. Collaborate with intake RN daily or as condition indicates. Assure the ED provider has rounded once each shift. Provide and encourage hygiene. Provide redirection as needed. Assess for escalating behavior; address immediately and inform ED provider.  Assess family dynamic and appropriateness for visitation as needed: Yes.  ; If necessary, describe findings:  Educate the patient/family about BHU procedures/visitation: Yes.  ; If necessary, describe findings:

## 2015-06-07 ENCOUNTER — Encounter: Payer: Self-pay | Admitting: Psychiatry

## 2015-06-07 DIAGNOSIS — M1990 Unspecified osteoarthritis, unspecified site: Secondary | ICD-10-CM

## 2015-06-07 DIAGNOSIS — E039 Hypothyroidism, unspecified: Secondary | ICD-10-CM

## 2015-06-07 DIAGNOSIS — I4891 Unspecified atrial fibrillation: Secondary | ICD-10-CM

## 2015-06-07 DIAGNOSIS — F200 Paranoid schizophrenia: Secondary | ICD-10-CM | POA: Insufficient documentation

## 2015-06-07 DIAGNOSIS — H9190 Unspecified hearing loss, unspecified ear: Secondary | ICD-10-CM

## 2015-06-07 DIAGNOSIS — K219 Gastro-esophageal reflux disease without esophagitis: Secondary | ICD-10-CM

## 2015-06-07 DIAGNOSIS — I10 Essential (primary) hypertension: Secondary | ICD-10-CM

## 2015-06-07 DIAGNOSIS — I2510 Atherosclerotic heart disease of native coronary artery without angina pectoris: Secondary | ICD-10-CM

## 2015-06-07 DIAGNOSIS — G3184 Mild cognitive impairment, so stated: Secondary | ICD-10-CM

## 2015-06-07 DIAGNOSIS — K589 Irritable bowel syndrome without diarrhea: Secondary | ICD-10-CM

## 2015-06-07 LAB — AMMONIA: Ammonia: 19 umol/L (ref 9–35)

## 2015-06-07 LAB — GLUCOSE, RANDOM: Glucose, Bld: 177 mg/dL — ABNORMAL HIGH (ref 65–99)

## 2015-06-07 LAB — GLUCOSE, CAPILLARY
Glucose-Capillary: 104 mg/dL — ABNORMAL HIGH (ref 65–99)
Glucose-Capillary: 148 mg/dL — ABNORMAL HIGH (ref 65–99)

## 2015-06-07 LAB — HEMOGLOBIN A1C: Hgb A1c MFr Bld: 7 % — ABNORMAL HIGH (ref 4.0–6.0)

## 2015-06-07 MED ORDER — NITROGLYCERIN 0.4 MG SL SUBL
0.4000 mg | SUBLINGUAL_TABLET | SUBLINGUAL | Status: DC | PRN
Start: 2015-06-07 — End: 2015-06-12

## 2015-06-07 MED ORDER — INSULIN ASPART 100 UNIT/ML ~~LOC~~ SOLN: 0.0000 [IU] | Freq: Every day | SUBCUTANEOUS | Status: DC

## 2015-06-07 MED ORDER — ASPIRIN 81 MG PO CHEW
81.0000 mg | CHEWABLE_TABLET | Freq: Every day | ORAL | Status: DC
  Administered 2015-06-07 – 2015-06-12 (×6): 81 mg via ORAL
  Filled 2015-06-07 (×6): qty 1

## 2015-06-07 MED ORDER — INSULIN ASPART 100 UNIT/ML ~~LOC~~ SOLN
0.0000 [IU] | Freq: Three times a day (TID) | SUBCUTANEOUS | Status: DC
  Administered 2015-06-07: 1 [IU] via SUBCUTANEOUS
  Administered 2015-06-10: 5 [IU] via SUBCUTANEOUS
  Administered 2015-06-11 (×2): 1 [IU] via SUBCUTANEOUS
  Administered 2015-06-12: 3 [IU] via SUBCUTANEOUS
  Administered 2015-06-12: 2 [IU] via SUBCUTANEOUS
  Filled 2015-06-07: qty 1
  Filled 2015-06-07: qty 2
  Filled 2015-06-07: qty 3
  Filled 2015-06-07: qty 2
  Filled 2015-06-07: qty 5

## 2015-06-07 MED ORDER — LEVOTHYROXINE SODIUM 100 MCG PO TABS
50.0000 ug | ORAL_TABLET | Freq: Every day | ORAL | Status: DC
  Administered 2015-06-08 – 2015-06-12 (×5): 50 ug via ORAL
  Filled 2015-06-07: qty 2
  Filled 2015-06-07 (×4): qty 1

## 2015-06-07 NOTE — Progress Notes (Signed)
Recreation Therapy Notes  Date: 12.02.16 Time: 3:00 pm Location: Craft Room  Group Topic: Problem solving, Communication, Teamwork  Goal Area(s) Addresses:  Patient will effectively work with peer towards shared goal. Patient will identify skills used to make activity successful. Patient will identify benefit of using group skills effectively post d/c.  Behavioral Response: Did not attend  Intervention: [employer] Tower  Activity: Patients were given 2 minutes to strategize on how they were going to build a free standing tower out of 15 pipe cleaners. After approximately 5 minutes of building, patients were instructed to put their dominant hand behind their back. After approximately 4 minutes, patients were instructed not to talk to each other.  Education: LRT educated patients on healthy support systems.  Education Outcome: Patient did not attend group.  Clinical Observations/Feedback: Patient did not attend group.  Jacquelynn CreeGreene,Sachit Gilman M, LRT/CTRS 06/07/2015 4:37 PM

## 2015-06-07 NOTE — [facility] Group Notes (Signed)
[facility] LCSW Group Therapy  06/07/2015 4:12 PM  Type of Therapy:  Group Therapy  Participation Level:  Did Not Attend  Modes of Intervention:  Discussion, Education, Socialization and Support  Summary of Progress/Problems: Feelings around Relapse. Group members discussed the meaning of relapse and shared personal stories of relapse, how it affected them and others, and how they perceived themselves during this time. Group members were encouraged to identify triggers, warning signs and coping skills used when facing the possibility of relapse. Social supports were discussed and explored in detail.   Cecily Lawhorne L Antwain Caliendo MSW, LCSWA  06/07/2015, 4:12 PM 

## 2015-06-07 NOTE — [facility] Suicide Risk Assessment (Signed)
[facility]   Nursing information obtained from:    Demographic factors:    Current Mental Status:    Loss Factors:    Historical Factors:    Risk Reduction Factors:    Total Time spent with patient: 1 hour Principal Problem: Schizophrenia (HCC) Diagnosis:   Patient Active Problem List   Diagnosis Date Noted  . 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 Essential hypertension [I10] 06/07/2015  . Hearing loss [H91.90] 06/07/2015  . Hypothyroidism [E03.9] 06/07/2015  . Cardiovascular disease [I25.10] 06/07/2015  . Atrial fibrillation (HCC) [I48.91] 06/07/2015  . GERD (gastroesophageal reflux disease) [K21.9] 06/07/2015  . IBS (irritable bowel syndrome) [K58.9] 06/07/2015  . Osteoarthritis [M19.90] 06/07/2015  . Schizophrenia (HCC) [F20.9] 06/06/2015  . Diabetes (HCC) [E11.9] 03/19/2015     Continued Clinical Symptoms:  Alcohol Use Disorder Identification Test Final Score (AUDIT): 0 The "Alcohol Use Disorders Identification Test", Guidelines for Use in Primary Care, Second Edition.  World Science writerHealth Organization [facility](WHO). Score between 0-7:  no or low risk or alcohol related problems. Score between 8-15:  moderate risk of alcohol related problems. Score between 16-19:  high risk of alcohol related problems. Score 20 or above:  warrants further diagnostic evaluation for alcohol dependence and treatment.   CLINICAL FACTORS:   Schizophrenia:   Paranoid or undifferentiated type Previous Psychiatric Diagnoses and Treatments Medical Diagnoses and Treatments/Surgeries   Psychiatric Specialty Exam: Physical Exam  ROS    COGNITIVE FEATURES THAT CONTRIBUTE TO RISK:  Closed-mindedness    SUICIDE RISK:   Mild:  Suicidal ideation of limited frequency, intensity, duration, and specificity.  There are no identifiable plans, no associated intent, mild dysphoria and related symptoms, good self-control (both objective and subjective assessment), few other risk  factors, and identifiable protective factors, including available and accessible social support.  [facility]  Medical Decision Making:  Established Problem, Worsening (2)  I certify that inpatient services furnished can reasonably be expected to improve the patient's condition.   Jimmy FootmanHernandez-Gonzalez,  Jahni Paul 06/07/2015, 2:37 PM

## 2015-06-07 NOTE — Progress Notes (Signed)
She is pleasant & cooperative on approach.She is positive for auditory hallucination.Stated that somebody always talks to her but she does not like him.Visible in the milieu.Appropriate with peers.Compliant with medicines.Appetite fair.

## 2015-06-07 NOTE — Progress Notes (Signed)
Recreation Therapy Notes  Patient is not appropriate for assessment at this time.  Jacquelynn CreeGreene,Orvis Stann M, LRT/CTRS 06/07/2015 5:16 PM

## 2015-06-07 NOTE — Plan of Care (Signed)
Problem: Alteration in thought process Goal: LTG-Patient verbalizes understanding importance med regimen (Patient verbalizes understanding of importance of medication regimen and need to continue outpatient care.)  Outcome: Progressing Patient verbalized what medications she takes and asked to take them.

## 2015-06-07 NOTE — H&P (Addendum)
Psychiatric Admission Assessment Adult  Patient Identification: Danielle Boyer MRN:  [id number] Date of Evaluation:  06/07/2015 Chief Complaint: Psychosis Principal Diagnosis: Schizophrenia (HCC) Diagnosis:   Patient Active Problem List   Diagnosis Date Noted  . 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 Essential hypertension [I10] 06/07/2015  . Hearing loss [H91.90] 06/07/2015  . Hypothyroidism [E03.9] 06/07/2015  . Cardiovascular disease [I25.10] 06/07/2015  . Atrial fibrillation (HCC) [I48.91] 06/07/2015  . GERD (gastroesophageal reflux disease) [K21.9] 06/07/2015  . IBS (irritable bowel syndrome) [K58.9] 06/07/2015  . Osteoarthritis [M19.90] 06/07/2015  . Schizophrenia (HCC) [F20.9] 06/06/2015  . Diabetes (HCC) [E11.9] 03/19/2015   History of Present Illness:  74 year old woman with history of schizophrenia vs bipolar disorder presented to the ER on 11/30 via EMS. The patient was at home and apparently called 911 several times. Patient reported  there was someone whom she knew was dead whose voice was telling her that he was going to killed her dog. She has no thoughts of harm her self or others but she is very concerned that these people that she can hear talking to her, one of whom she believes to have died several years ago, will cause her harm.  This patient was just seen a couple days ago in the emergency room under similar circumstances. She says  that people who are calling her (speaking to her through the roof of her house) are  threatening to kill her and her dog. She hasn't been sleeping well. Her mood is anxious and nervous. Patient claims that she's been compliant with her medicine but isn't really able to detail what she takes. Denies that she's been abusing substances. Denies any specific new psychosocial trauma.   Today during assessment she was calm, friendly and cooperative.  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  Says she uses a hearing aid at home but she lost it prior to  coming into the hospital. Pt denies hearing voices today.  She thinks one of her medicines was making her hear things.  She has a mix breed dog at home called Danielle Boyer.  Patient denies having any problems with her mood, appetite, energy or concentration.  Denies SI, HI or hallucinations.  Per nursing notes looks like she is still thinking someone was talking-threatening her at home.   Substance abuse history: Patient does not have a significant substance abuse history and is not currently abusing any substances  Associated Signs/Symptoms: Depression Symptoms:  insomnia, (Hypo) Manic Symptoms:  none Anxiety Symptoms:  none Psychotic Symptoms:  Hallucinations: Auditory Paranoia, PTSD Symptoms: Negative   Total Time spent with patient: 1 hour  Past Psychiatric History: In Feb 2016 she was in our unit for psychosis. In 2011 she stayed in our unit for almost 3 weeks for similar symptoms. She was treated with Risperdal, Haldol, Depakote, and Zyprexa. There are no suicide attempts. No history of substance use.    Past Medical History: Past Surgical History  Procedure Laterality Date  . Cardiac catheterization  . Stent placement 1999  at [facility]  . Appendectomy  . Hysterectomy  with one ovary removed  Past Medical History  Diagnosis Date  . Diabetes mellitus without complication (HCC)   . Hypertension     Past Surgical History  Procedure Laterality Date  . Abdominal hysterectomy    . Cesarean section      x3   Family History:  Family History  Problem Relation Age of Onset  . Breast cancer Sister    Family Psychiatric  History: With depression and anxiety. Her sister is on Xanax.    Social History: She is widowed. She lives by herself with a dog. She believes that the dog is the only other being that is able to hear the voices as reportedly the dog barks when the patient hears the voices. She has a very supportive family. Her younger son, Danielle Boyer, who is the  healthcare power of attorney, spends weekends with her. She often goes to the house of Danielle Boyer, her older son, but she refuses to stay there for the night even though the family made arrangements for that. She also has a daughter, Danielle Boyer, who has been bringing the patient to all of her appointments since 2011. She has Medicare and Medicaid. The patient all her life she has been either married or in a relationship and it has been only a couple of years when she does not have a female partner in the house. She still drives a car and visits her son's bakery in [place] almost daily.     History  Alcohol Use No     History  Drug Use No    Social History   Social History  . Marital Status: Widowed    Spouse Name: N/A  . Number of Children: N/A  . Years of Education: N/A   Social History Main Topics  . Smoking status: Never Smoker   . Smokeless tobacco: Never Used  . Alcohol Use: No  . Drug Use: No  . Sexual Activity: Not Asked   Other Topics Concern  . None   Social History Narrative   Allergies:   Allergies  Allergen Reactions  . Citalopram Other (See Comments)    Reaction:  Unknown   . Penicillins Rash and Other (See Comments)    Unable to obtain enough information to answer additional questions about this medication.     Lab Results:  Results for orders placed or performed during the hospital encounter of 06/06/15 (from the past 48 hour(s))  Glucose, random     Status: Abnormal   Collection Time: 06/06/15  3:28 PM  Result Value Ref Range   Glucose, Bld 126 (H) 65 - 99 mg/dL  Hemoglobin Z6X     Status: Abnormal   Collection Time: 06/06/15  3:31 PM  Result Value Ref Range   Hgb A1c MFr Bld 7.0 (H) 4.0 - 6.0 %  Lipid panel, fasting     Status: Abnormal   Collection Time: 06/06/15  3:31 PM  Result Value Ref Range   Cholesterol 236 (H) 0 - 200 mg/dL   Triglycerides 096 (H) <150 mg/dL   HDL 38 (L) >04 mg/dL   Total CHOL/HDL Ratio 6.2 RATIO   VLDL 77 (H) 0 - 40 mg/dL   LDL  Cholesterol 540 (H) 0 - 99 mg/dL    Comment:        Total Cholesterol/HDL:CHD Risk Coronary Heart Disease Risk Table                     Men   Women  1/2 Average Risk   3.4   3.3  Average Risk       5.0   4.4  2 X Average Risk   9.6   7.1  3 X Average Risk  23.4   11.0        Use the calculated Patient Ratio above and the CHD Risk Table to determine the patient's CHD Risk.        ATP  III CLASSIFICATION (LDL):  <100     mg/dL   Optimal  409-811  mg/dL   Near or Above                    Optimal  130-159  mg/dL   Borderline  914-782  mg/dL   High  >956     mg/dL   Very High   TSH     Status: Abnormal   Collection Time: 06/06/15  3:31 PM  Result Value Ref Range   TSH 10.236 (H) 0.350 - 4.500 uIU/mL  Glucose, capillary     Status: Abnormal   Collection Time: 06/06/15  8:38 PM  Result Value Ref Range   Glucose-Capillary 111 (H) 65 - 99 mg/dL  Glucose, capillary     Status: Abnormal   Collection Time: 06/07/15  6:50 AM  Result Value Ref Range   Glucose-Capillary 104 (H) 65 - 99 mg/dL  Ammonia     Status: None   Collection Time: 06/07/15  4:06 PM  Result Value Ref Range   Ammonia 19 9 - 35 umol/L  Glucose, random     Status: Abnormal   Collection Time: 06/07/15  4:07 PM  Result Value Ref Range   Glucose, Bld 177 (H) 65 - 99 mg/dL  Glucose, capillary     Status: Abnormal   Collection Time: 06/07/15  4:45 PM  Result Value Ref Range   Glucose-Capillary 148 (H) 65 - 99 mg/dL    Metabolic Disorder Labs:  Lab Results  Component Value Date   HGBA1C 7.0* 06/06/2015   No results found for: PROLACTIN Lab Results  Component Value Date   CHOL 236* 06/06/2015   TRIG 387* 06/06/2015   HDL 38* 06/06/2015   CHOLHDL 6.2 06/06/2015   VLDL 77* 06/06/2015   LDLCALC 121* 06/06/2015    Current Medications: Current Facility-Administered Medications  Medication Dose Route Frequency Provider Last Rate Last Dose  . acetaminophen (TYLENOL) tablet 650 mg  650 mg Oral Q6H PRN Audery Amel, MD      . alum & mag hydroxide-simeth (MAALOX/MYLANTA) 200-200-20 MG/5ML suspension 30 mL  30 mL Oral Q4H PRN Audery Amel, MD      . aspirin chewable tablet 81 mg  81 mg Oral Daily Jimmy Footman, MD   81 mg at 06/07/15 1655  . furosemide (LASIX) tablet 20 mg  20 mg Oral Daily Audery Amel, MD   20 mg at 06/07/15 0835  . insulin aspart (novoLOG) injection 0-5 Units  0-5 Units Subcutaneous QHS Jimmy Footman, MD      . insulin aspart (novoLOG) injection 0-9 Units  0-9 Units Subcutaneous TID WC Jimmy Footman, MD   1 Units at 06/07/15 1656  . insulin aspart protamine- aspart (NOVOLOG MIX 70/30) injection 20 Units  20 Units Subcutaneous Q supper Audery Amel, MD   20 Units at 06/07/15 1656  . insulin aspart protamine- aspart (NOVOLOG MIX 70/30) injection 40 Units  40 Units Subcutaneous Q breakfast Audery Amel, MD   40 Units at 06/07/15 [phone]  . [START ON 06/08/2015] levothyroxine (SYNTHROID, LEVOTHROID) tablet 50 mcg  50 mcg Oral QAC breakfast Jimmy Footman, MD      . magnesium hydroxide (MILK OF MAGNESIA) suspension 30 mL  30 mL Oral Daily PRN Audery Amel, MD      . metFORMIN (GLUCOPHAGE) tablet 1,000 mg  1,000 mg Oral BID WC Audery Amel, MD   1,000 mg at 06/07/15 1655  .  metoprolol tartrate (LOPRESSOR) tablet 50 mg  50 mg Oral BID Audery Amel, MD   50 mg at 06/07/15 0834  . nitroGLYCERIN (NITROSTAT) SL tablet 0.4 mg  0.4 mg Sublingual Q5 min PRN Jimmy Footman, MD      . OLANZapine [facility]) tablet 10 mg  10 mg Oral QHS Audery Amel, MD   10 mg at 06/06/15 2116  . pantoprazole (PROTONIX) EC tablet 40 mg  40 mg Oral Daily Audery Amel, MD   40 mg at 06/07/15 0835  . potassium chloride SA (K-DUR,KLOR-CON) CR tablet 20 mEq  20 mEq Oral Daily Audery Amel, MD   20 mEq at 06/07/15 0834  . ranolazine (RANEXA) 12 hr tablet 500 mg  500 mg Oral BID Audery Amel, MD   500 mg at 06/07/15 0834  . traZODone (DESYREL) tablet  100 mg  100 mg Oral QHS Audery Amel, MD   100 mg at 06/06/15 2116   PTA Medications: Prescriptions prior to admission  Medication Sig Dispense Refill Last Dose  . furosemide (LASIX) 20 MG tablet Take 20 mg by mouth daily.   unknown at unknown  . haloperidol (HALDOL) 0.5 MG tablet Take 0.5 mg by mouth 2 (two) times daily.   unknown at unknown  . insulin NPH-regular Human (HUMULIN 70/30) (70-30) 100 UNIT/ML injection Inject 20-40 Units into the skin 2 (two) times daily. Pt uses 40 units in the morning and 20 units in the evening.   unknown at unknown  . metFORMIN (GLUCOPHAGE) 1000 MG tablet Take 1,000 mg by mouth 2 (two) times daily.   unknown at unknown  . metoprolol (LOPRESSOR) 50 MG tablet Take 50 mg by mouth 2 (two) times daily.   unknown at unknown  . nitroGLYCERIN (NITROSTAT) 0.4 MG SL tablet Place 0.4 mg under the tongue every 5 (five) minutes as needed for chest pain.   PRN at PRN  . OLANZapine (ZYPREXA) 10 MG tablet Take 1 tablet (10 mg total) by mouth at bedtime. 30 tablet 1 unknown at unknown  . omeprazole (PRILOSEC) 20 MG capsule Take 20 mg by mouth 2 (two) times daily.   unknown at unknown  . potassium chloride SA (K-DUR,KLOR-CON) 20 MEQ tablet Take 20 mEq by mouth daily.   unknown at unknown  . ranolazine (RANEXA) 500 MG 12 hr tablet Take 500 mg by mouth 2 (two) times daily.   unknown at unknown  . traZODone (DESYREL) 100 MG tablet Take 100 mg by mouth at bedtime.   unknown at unknown    Musculoskeletal: Strength & Muscle Tone: within normal limits Gait & Station: normal Patient leans: N/A  Psychiatric Specialty Exam: Physical Exam  Constitutional: She is oriented to person, place, and time. She appears well-developed and well-nourished.  HENT:  Head: Normocephalic and atraumatic.  Eyes: Conjunctivae and EOM are normal.  Neck: Normal range of motion.  Respiratory: Effort normal.  Musculoskeletal: Normal range of motion.  Neurological: She is alert and oriented to  person, place, and time.  Skin: Skin is warm and dry.    Review of Systems  Constitutional: Negative.   HENT: Negative.   Eyes: Negative.   Respiratory: Negative.   Cardiovascular: Negative.   Genitourinary: Negative.   Musculoskeletal: Negative.   Skin: Negative.   Neurological: Negative.   Endo/Heme/Allergies: Negative.   Psychiatric/Behavioral: Positive for hallucinations.    Blood pressure 138/86, pulse 80, temperature 98.2 F (36.8 C), temperature source Oral, resp. rate 16, height 5\' 3"  (1.6 m), weight  69.854 kg (154 lb).Body mass index is 27.29 kg/(m^2).  General Appearance: Well Groomed  Patent attorneyye Contact::  Good  Speech:  Clear and Coherent  Volume:  Normal  Mood:  Euthymic  Affect:  Congruent  Thought Process:  Linear  Orientation:  Full (Time, Place, and Person)  Thought Content:  Hallucinations: Auditory  Suicidal Thoughts:  No  Homicidal Thoughts:  No  Memory:  Immediate;   Good Recent;   Good Remote;   Good  Judgement:  Impaired  Insight:  Lacking  Psychomotor Activity:  Normal  Concentration:  Fair  Recall:  FiservFair  Fund of Knowledge:Fair  Language: Good  Akathisia:  No  Handed:    AIMS (if indicated):     Assets:  ArchitectCommunication Skills Financial Resources/Insurance Housing Social Support  ADL's:  Intact  Cognition: WNL  Sleep:  Number of Hours: 6.15     Treatment Plan Summary: Daily contact with patient to assess and evaluate symptoms and progress in treatment and Medication management   74 year old female with history of schizophrenia versus bipolar disorder who has presented to our emergency department twice over the last couple of days reporting psychotic symptoms. I suspect patient is noncompliant with her medications as a means by an elevated TSH, an elevated hemoglobin A1c and current psychotic symptoms despite being prescribed with olanzapine 10 mg daily at bedtime.  Schizophrenia: Continue Zyprexa 10 mg daily at bedtime  Angina:Continue   Ranexa 500 mg by mouth twice a day.  Patient will also be continued on nitroglycerin when necessary.  Cardiovascular disease: continue ASA 81 mg /d  Hypertension: Continue metoprolol 50 mg by mouth twice a day  Edema: Continue Lasix 20 mg a day  Hypokalemia: Continue to 20 mEq a day  Diabetes: Continue insulin 70/30.  Patient is to take 20 units in the morning and 40 units at bedtime. She will also will be continued on metformin at thousand milligrams by mouth twice a day. Hemoglobin A1c is about 7. I will order supplemental insulin.  Capillary blood glucose will be check 4 times a day  Hypothyroidism: Back in February patient was on Synthroid 50 g a day. This medication was not listed as part of her home medicines. TSH was below 10. I will restart synthroid 50 mcg/d.  I will check TSH and T4 and T3  GERD: Continue daily PPI  Vital signs will be check every 12 hours.  Diet: I will change her diet to low sodium and carb modified  Precautions every 15 minute checks  Hospitalization and status: Continue involuntary commitment  Labs: I we'll recheck TSH, T4, T3. I will check HIV, RPR, ammonia level in vitamin B12.  I will check a follow-up basic metabolic panel on Monday.  I will check today a urine culture as she has repetitive history of UTIs.  EKG: I will also order an EKG as patient has history of heart disease.---Reviewed WNL  [facility] were reviewed. I added her surgical history and her past medical history to my note. Records from her 2 prior psychiatric hospitalizations were reviewed see information above.    I certify that inpatient services furnished can reasonably be expected to improve the patient's condition.   Jimmy FootmanHernandez-Gonzalez,  Nishat Livingston 12/2/20168:48 PM

## 2015-06-08 LAB — URINE CULTURE

## 2015-06-08 LAB — GLUCOSE, RANDOM
GLUCOSE: 150 mg/dL — AB (ref 65–99)
GLUCOSE: 61 mg/dL — AB (ref 65–99)
GLUCOSE: 70 mg/dL (ref 65–99)
GLUCOSE: 96 mg/dL (ref 65–99)

## 2015-06-08 LAB — RPR: RPR: NONREACTIVE

## 2015-06-08 LAB — GLUCOSE, CAPILLARY
GLUCOSE-CAPILLARY: 96 mg/dL (ref 65–99)
GLUCOSE-CAPILLARY: 63 mg/dL — AB (ref 65–99)
GLUCOSE-CAPILLARY: 54 mg/dL — AB (ref 65–99)
GLUCOSE-CAPILLARY: 57 mg/dL — AB (ref 65–99)
GLUCOSE-CAPILLARY: 82 mg/dL (ref 65–99)
Glucose-Capillary: 131 mg/dL — ABNORMAL HIGH (ref 65–99)
Glucose-Capillary: 43 mg/dL — CL (ref 65–99)
Glucose-Capillary: 63 mg/dL — ABNORMAL LOW (ref 65–99)

## 2015-06-08 LAB — THYROID PANEL WITH TSH
Free Thyroxine Index: 1.7 (ref 1.2–4.9)
T3 UPTAKE RATIO: 24 % (ref 24–39)
T4, Total: 7.1 ug/dL (ref 4.5–12.0)
TSH: 9.72 u[IU]/mL — AB (ref 0.450–4.500)

## 2015-06-08 LAB — HIV ANTIBODY (ROUTINE TESTING W REFLEX): HIV SCREEN 4TH GENERATION: NONREACTIVE

## 2015-06-08 LAB — VITAMIN B12: VITAMIN B 12: 258 pg/mL (ref 180–914)

## 2015-06-08 MED ORDER — INSULIN ASPART PROT & ASPART (70-30 MIX) 100 UNIT/ML ~~LOC~~ SUSP
30.0000 [IU] | Freq: Every day | SUBCUTANEOUS | Status: DC
  Administered 2015-06-09 – 2015-06-10 (×2): 30 [IU] via SUBCUTANEOUS
  Filled 2015-06-08 (×2): qty 30

## 2015-06-08 MED ORDER — CYANOCOBALAMIN 500 MCG PO TABS
1000.0000 ug | ORAL_TABLET | Freq: Every day | ORAL | Status: DC
  Administered 2015-06-11 – 2015-06-12 (×2): 1000 ug via ORAL
  Filled 2015-06-08 (×2): qty 2

## 2015-06-08 MED ORDER — CYANOCOBALAMIN 1000 MCG/ML IJ SOLN
1000.0000 ug | Freq: Once | INTRAMUSCULAR | Status: AC
  Administered 2015-06-08: 1000 ug via INTRAMUSCULAR
  Filled 2015-06-08: qty 1

## 2015-06-08 MED ORDER — CYANOCOBALAMIN 1000 MCG/ML IJ SOLN
1000.0000 ug | Freq: Once | INTRAMUSCULAR | Status: AC
  Administered 2015-06-10: 1000 ug via INTRAMUSCULAR
  Filled 2015-06-08: qty 1

## 2015-06-08 MED ORDER — CYANOCOBALAMIN 1000 MCG/ML IJ SOLN
1000.0000 ug | Freq: Once | INTRAMUSCULAR | Status: DC
  Filled 2015-06-08: qty 1

## 2015-06-08 MED ORDER — CYANOCOBALAMIN 1000 MCG/ML IJ SOLN
1000.0000 ug | Freq: Once | INTRAMUSCULAR | Status: AC
  Administered 2015-06-09: 1000 ug via INTRAMUSCULAR
  Filled 2015-06-08: qty 1

## 2015-06-08 NOTE — Plan of Care (Signed)
Problem: Ineffective individual coping Goal: LTG: Patient will report a decrease in negative feelings Outcome: Progressing Patient reports feeling better and being more oriented. She told stories of her prior confusion, noting at one point she did not know who her son was. She notes that level of disorientation is gone and she is hopeful it will not return.

## 2015-06-08 NOTE — Plan of Care (Signed)
Problem: Ineffective individual coping Goal: STG: Patient will remain free from self harm Outcome: Progressing No self harm.  Goal: STG:Pt. will utilize relaxation techniques to reduce stress STG: Patient will utilize relaxation techniques to reduce stress levels  Outcome: Progressing Patient is interactive and pleasant in the milieu.

## 2015-06-08 NOTE — Plan of Care (Signed)
Problem: Alteration in thought process Goal: LTG-Patient has not harmed self or others in at least 2 days Outcome: Progressing Denies suicidal ideations      

## 2015-06-08 NOTE — [facility] Group Notes (Signed)
[facility] LCSW Group Therapy  06/08/2015 2:59 PM  Type of Therapy:  Group Therapy  Participation Level:  Minimal  Participation Quality:  Attentive  Affect:  Flat  Cognitive:  Alert  Insight:  Limited  Engagement in Therapy:  Limited  Modes of Intervention:  Discussion, Education, Socialization and Support  Summary of Progress/Problems: Todays topic: Grudges  Patients will be encouraged to discuss their thoughts, feelings, and behaviors as to why one holds on to grudges and reasons why people have grudges. Patients will process the impact of grudges on their daily lives and identify thoughts and feelings related to holding grudges. Patients will identify feelings and thoughts related to what life would look like without grudges. Danielle Boyer attended group and stayed most of the time. She had difficulties participating due to being hard of hearing.   Sempra EnergyCandace L Morissa Boyer MSW, LCSWA  06/08/2015, 2:59 PM

## 2015-06-08 NOTE — Progress Notes (Addendum)
D: Patient stated slept fair last night .Stated appetite is good and energy level  Is normal. Stated concentration is good . Stated on Depression scale 8 , hopeless 6and anxiety  7.( low 0-10 high) Denies suicidal  homicidal ideations  .  No auditory hallucinations  No pain concerns . Appropriate ADL'S. Interacting with peers and staff. during shift. Hard of hearing staff having to speak louder . Voice of nothing being wrong with her and is ready to go  Home .   A: Encourage patient participation with unit programming . Instruction  Given on  Medication , verbalize understanding. R: Voice no other concerns. Staff continue to monitor

## 2015-06-08 NOTE — Progress Notes (Signed)
Pt blood sugar was found to be 43 during routine nightly check. Patient was asymptomatic. She was immediately seated and given 2 orange juices and a granola bar while being continually monitored. Patient was rechecked and found to be at 63. Patient given nighttime medicines and allowed to go her room to rest per her request. Patient refused any other snacks. Patient rechecked again approximately 20 minutes later and blood sugar was up to 82. Patient was resting comfortably and will continue to be monitored Q 15 minutes.

## 2015-06-08 NOTE — Progress Notes (Signed)
[facility] MD Progress Note  06/08/2015 3:33 PM Danielle Boyer  MRN:  [id number] Subjective:   History was difficult to obtain as the patient has significant problems with hearing and lost her hearing aid prior to admission. Per nursing, she has been calm and cooperative on the unit. The patient now states that the voice that she heard from her deceased friend telling her that he was going to kill her and her dog occurred 15 years ago. The patient however was not fully oriented as she gave the year as 2017 and believes both [address] and Garnet Koyanagi won the presidency to work together. The patient says her mood is "good" but would not elaborate. She is denying any current auditory or visual hallucinations. She has been compliant with medications and denies any physical adverse side effects. She denies any somatic complaints. Appetite is good although blood sugar was low this morning. Blood pressure was also low this morning and metoprolol was held this morning. It is unclear whether or not the patient was truly compliant with medications outside of the hospital. Per nursing, she did sleep over 7 hours last night. She has been visible on the unit and interacts well with staff although her hearing continues to be a problem.   Principal Problem: Schizophrenia (HCC) Diagnosis:   Patient Active Problem List   Diagnosis Date Noted  . 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 Essential hypertension [I10] 06/07/2015  . Hearing loss [H91.90] 06/07/2015  . Hypothyroidism [E03.9] 06/07/2015  . Cardiovascular disease [I25.10] 06/07/2015  . Atrial fibrillation (HCC) [I48.91] 06/07/2015  . GERD (gastroesophageal reflux disease) [K21.9] 06/07/2015  . IBS (irritable bowel syndrome) [K58.9] 06/07/2015  . Osteoarthritis [M19.90] 06/07/2015  . Paranoid schizophrenia (HCC) [F20.0]   . Schizophrenia (HCC) [F20.9] 06/06/2015  . Diabetes (HCC) [E11.9] 03/19/2015   Total Time spent with patient: 30  minutes  Past Psychiatric History: In Feb 2016 she was in our unit for psychosis. In 2011 she stayed in our unit for almost 3 weeks for similar symptoms. She was treated with Risperdal, Haldol, Depakote, and Zyprexa. There are no suicide attempts. No history of substance use.   Past Medical History: Past Surgical History  Procedure Laterality Date  . Cardiac catheterization  . Stent placement 1999  at [facility]  . Appendectomy  . Hysterectomy  with one ovary removed  Past Medical History  Diagnosis Date  . Diabetes mellitus without complication (HCC)   . Hypertension     Past Surgical History  Procedure Laterality Date  . Abdominal hysterectomy    . Cesarean section      x3   Family History:  Family History  Problem Relation Age of Onset  . Breast cancer Sister    Family Psychiatric History: With depression and anxiety. Her sister is on Xanax.   Social History: She is widowed. She lives by herself with a dog. She believes that the dog is the only other being that is able to hear the voices as reportedly the dog barks when the patient hears the voices. She has a very supportive family. Her younger son, Danielle Boyer, who is the healthcare power of attorney, spends weekends with her. She often goes to the house of Danielle Boyer, her older son, but she refuses to stay there for the night even though the family made arrangements for that. She also has a daughter, Danielle Boyer, who has been bringing the patient to all of her appointments since 2011. She has Medicare and Medicaid. The  patient all her life she has been either married or in a relationship and it has been only a couple of years when she does not have a female partner in the house. She still drives a car and visits her son's bakery in [place] almost daily.        Past Medical History:  Past Medical History  Diagnosis Date  . Diabetes mellitus without complication (HCC)   . Hypertension      Past Surgical History  Procedure Laterality Date  . Abdominal hysterectomy    . Cesarean section      x3   Family History:  Family History  Problem Relation Age of Onset  . Breast cancer Sister     Social History:  History  Alcohol Use No     History  Drug Use No    Social History   Social History  . Marital Status: Widowed    Spouse Name: N/A  . Number of Children: N/A  . Years of Education: N/A   Social History Main Topics  . Smoking status: Never Smoker   . Smokeless tobacco: Never Used  . Alcohol Use: No  . Drug Use: No  . Sexual Activity: Not Asked   Other Topics Concern  . None   Social History Narrative        Sleep: Good  Appetite:  Good  Current Medications: Current Facility-Administered Medications  Medication Dose Route Frequency Provider Last Rate Last Dose  . acetaminophen (TYLENOL) tablet 650 mg  650 mg Oral Q6H PRN Audery Amel, MD      . alum & mag hydroxide-simeth (MAALOX/MYLANTA) 200-200-20 MG/5ML suspension 30 mL  30 mL Oral Q4H PRN Audery Amel, MD      . aspirin chewable tablet 81 mg  81 mg Oral Daily Jimmy Footman, MD   81 mg at 06/08/15 0850  . furosemide (LASIX) tablet 20 mg  20 mg Oral Daily Audery Amel, MD   20 mg at 06/08/15 0849  . insulin aspart (novoLOG) injection 0-5 Units  0-5 Units Subcutaneous QHS Jimmy Footman, MD   0 Units at 06/07/15 2129  . insulin aspart (novoLOG) injection 0-9 Units  0-9 Units Subcutaneous TID WC Jimmy Footman, MD   1 Units at 06/07/15 1656  . insulin aspart protamine- aspart (NOVOLOG MIX 70/30) injection 20 Units  20 Units Subcutaneous Q supper Audery Amel, MD   20 Units at 06/07/15 1656  . [START ON 06/09/2015] insulin aspart protamine- aspart (NOVOLOG MIX 70/30) injection 30 Units  30 Units Subcutaneous Q breakfast Darliss Ridgel, MD      . levothyroxine (SYNTHROID, LEVOTHROID) tablet 50 mcg  50 mcg Oral QAC breakfast Jimmy Footman, MD   50 mcg  at 06/08/15 0844  . magnesium hydroxide (MILK OF MAGNESIA) suspension 30 mL  30 mL Oral Daily PRN Audery Amel, MD      . metFORMIN (GLUCOPHAGE) tablet 1,000 mg  1,000 mg Oral BID WC Audery Amel, MD   1,000 mg at 06/08/15 0843  . metoprolol tartrate (LOPRESSOR) tablet 50 mg  50 mg Oral BID Audery Amel, MD   50 mg at 06/07/15 2132  . nitroGLYCERIN (NITROSTAT) SL tablet 0.4 mg  0.4 mg Sublingual Q5 min PRN Jimmy Footman, MD      . OLANZapine (ZYPREXA) tablet 10 mg  10 mg Oral QHS Audery Amel, MD   10 mg at 06/07/15 2132  . pantoprazole (PROTONIX) EC tablet 40 mg  40  mg Oral Daily Audery Amel, MD   40 mg at 06/08/15 0850  . potassium chloride SA (K-DUR,KLOR-CON) CR tablet 20 mEq  20 mEq Oral Daily Audery Amel, MD   20 mEq at 06/08/15 0850  . ranolazine (RANEXA) 12 hr tablet 500 mg  500 mg Oral BID Audery Amel, MD   500 mg at 06/08/15 0847  . traZODone (DESYREL) tablet 100 mg  100 mg Oral QHS Audery Amel, MD   100 mg at 06/07/15 2132    Lab Results:  Results for orders placed or performed during the hospital encounter of 06/06/15 (from the past 48 hour(s))  Glucose, capillary     Status: Abnormal   Collection Time: 06/06/15  8:38 PM  Result Value Ref Range   Glucose-Capillary 111 (H) 65 - 99 mg/dL  Glucose, capillary     Status: Abnormal   Collection Time: 06/07/15  6:50 AM  Result Value Ref Range   Glucose-Capillary 104 (H) 65 - 99 mg/dL  Thyroid Panel With TSH     Status: Abnormal   Collection Time: 06/07/15  4:06 PM  Result Value Ref Range   TSH 9.720 (H) 0.450 - 4.500 uIU/mL   T4, Total 7.1 4.5 - 12.0 ug/dL   T3 Uptake Ratio 24 24 - 39 %   Free Thyroxine Index 1.7 1.2 - 4.9    Comment: (NOTE) Performed At: [facility] [address] [place], [place] [id number] Mila Homer MD NW:[id number]   Urine culture     Status: None (Preliminary result)   Collection Time: 06/07/15  4:06 PM  Result Value Ref Range   Specimen Description URINE,  CLEAN CATCH    Special Requests Normal    Culture TOO YOUNG TO READ    Report Status PENDING   Ammonia     Status: None   Collection Time: 06/07/15  4:06 PM  Result Value Ref Range   Ammonia 19 9 - 35 umol/L  HIV antibody     Status: None   Collection Time: 06/07/15  4:06 PM  Result Value Ref Range   HIV Screen 4th Generation wRfx Non Reactive Non Reactive    Comment: (NOTE) Performed At: [facility] [address] [place], [place] [id number] Mila Homer MD BM:[id number]   RPR     Status: None   Collection Time: 06/07/15  4:06 PM  Result Value Ref Range   RPR Ser Ql Non Reactive Non Reactive    Comment: (NOTE) Performed At: [facility] [address] [place], [place] [id number] Mila Homer MD IH:[id number]   Vitamin B12     Status: None   Collection Time: 06/07/15  4:06 PM  Result Value Ref Range   Vitamin B-12 258 180 - 914 pg/mL    Comment: (NOTE) This assay is not validated for testing neonatal or myeloproliferative syndrome specimens for Vitamin B12 levels. Performed at [facility]   Glucose, random     Status: Abnormal   Collection Time: 06/07/15  4:07 PM  Result Value Ref Range   Glucose, Bld 177 (H) 65 - 99 mg/dL  Glucose, capillary     Status: Abnormal   Collection Time: 06/07/15  4:45 PM  Result Value Ref Range   Glucose-Capillary 148 (H) 65 - 99 mg/dL  Glucose, random     Status: Abnormal   Collection Time: 06/07/15 11:42 PM  Result Value Ref Range   Glucose, Bld 61 (L) 65 - 99 mg/dL  Glucose, random  Status: None   Collection Time: 06/08/15  6:44 AM  Result Value Ref Range   Glucose, Bld 96 65 - 99 mg/dL  Glucose, capillary     Status: None   Collection Time: 06/08/15  7:00 AM  Result Value Ref Range   Glucose-Capillary 96 65 - 99 mg/dL  Glucose, random     Status: None   Collection Time: 06/08/15 11:51 AM  Result Value Ref Range   Glucose, Bld 70 65 - 99 mg/dL    Physical Findings: AIMS: Facial and Oral  Movements Muscles of Facial Expression: None, normal Lips and Perioral Area: None, normal Jaw: None, normal Tongue: None, normal,Extremity Movements Upper (arms, wrists, hands, fingers): None, normal Lower (legs, knees, ankles, toes): None, normal, Trunk Movements Neck, shoulders, hips: None, normal, Overall Severity Severity of abnormal movements (highest score from questions above): None, normal Incapacitation due to abnormal movements: None, normal Patient's awareness of abnormal movements (rate only patient's report): No Awareness, Dental Status Current problems with teeth and/or dentures?: No Does patient usually wear dentures?: Yes  CIWA:    COWS:     Musculoskeletal: Strength & Muscle Tone: within normal limits Gait & Station: normal Patient leans: N/A  Psychiatric Specialty Exam: Review of Systems  Constitutional: Negative for fever, chills, weight loss, malaise/fatigue and diaphoresis.  HENT: Negative for congestion, ear discharge, ear pain, hearing loss, nosebleeds, sore throat and tinnitus.   Eyes: Negative for blurred vision, double vision, photophobia, pain and discharge.  Respiratory: Negative for cough, hemoptysis, sputum production, shortness of breath and wheezing.   Cardiovascular: Negative for chest pain, palpitations, orthopnea, leg swelling and PND.  Gastrointestinal: Negative for heartburn, nausea, vomiting, abdominal pain, diarrhea, constipation and blood in stool.  Genitourinary: Negative for dysuria, urgency, frequency and hematuria.  Musculoskeletal: Negative for myalgias, back pain, joint pain, falls and neck pain.  Skin: Negative for itching and rash.  Neurological: Negative for dizziness, tingling, tremors, sensory change, focal weakness, seizures, loss of consciousness, weakness and headaches.  Endo/Heme/Allergies: Does not bruise/bleed easily.    Blood pressure 114/44, pulse 86, temperature 98.2 F (36.8 C), temperature source Oral, resp. rate 18,  height 5\' 3"  (1.6 m), weight 69.854 kg (154 lb).Body mass index is 27.29 kg/(m^2).  General Appearance: Casual  Eye Contact::  Good  Speech:  Clear and Coherent and Normal Rate  Volume:  Normal  Mood:  OK  Affect:  Calm and congruent.  Thought Process:  She had a difficult time hearing  Orientation:  Other:  Oriented to person and place and not time  Thought Content:  ? Hallucinations  Suicidal Thoughts:  No  Homicidal Thoughts:  No  Memory:  Immediate;   Poor Recent;   Poor Remote;   Poor  Judgement:  Impaired  Insight:  Lacking  Psychomotor Activity:  Slow gait  Concentration:  Fair  Recall:  Poor  Fund of Knowledge:Fair  Language: Fair  Akathisia:  No  Handed:  Right  AIMS (if indicated):     Assets:  Desire for Improvement Housing Physical Health  ADL's:  Intact  Cognition: Impaired,  Mild  Sleep:  Number of Hours: 73    74 year old female with history of schizophrenia versus bipolar disorder who has presented to our emergency department twice over the last couple of days reporting psychotic symptoms. I suspect patient is noncompliant with her medications as a means by an elevated TSH, an elevated hemoglobin A1c and current psychotic symptoms despite being prescribed with olanzapine 10 mg daily at bedtime.  Schizophrenia: Will continue Zyprexa 10 mg daily at bedtime. With her difficulty with hearing it is difficult to do a full assessment.  Angina:Continue Ranexa 500 mg by mouth twice a day. Patient will also be continued on nitroglycerin when necessary.  Cardiovascular disease: continue ASA 81 mg /d. Vital signs are stable.  Hypertension: Held Metoprolol 50 mg by mouth twice a day due to BP being low this morning. She may need a lower dose and was not compliant as an outpatient.  Edema: Continue Lasix 20 mg a day  Hypokalemia: Continue potassium 20 mEq a day  Diabetes: Blood sugar was low this morning. Will decrease insulin to 30 units in the morning and 20 units  with dinner.. Continue insulin 70/30. Marland Kitchen. She will also will be continued on metformin at thousand milligrams by mouth twice a day. Hemoglobin A1c is about 7. I will order supplemental insulin. Capillary blood glucose will be check 4 times a day  Hypothyroidism: Back in February patient was on Synthroid 50 g a day. This medication was not listed as part of her home medicines. TSH was below 10. I will restart synthroid 50 mcg/d. I will check TSH and T4 and T3  GERD: Continue daily PPI  Vital signs will be check every 12 hours.  Diet: I will change her diet to low sodium and carb modified  Precautions every 15 minute checks  Hospitalization and status: Continue involuntary commitment  Low B12: B12 level of 258. Will add B-12 1000mcg IM x 3 days and then orally.  Labs: I we'll recheck TSH, T4, T3. I will check HIV, RPR. I will check a follow-up basic metabolic panel on Monday. I will check today a urine culture as she has repetitive history of UTIs.  EKG: I will also order an EKG as patient has history of heart disease.---Reviewed WNL  [facility] were reviewed by Dr Ardyth HarpsHernandez.  Dr Ardyth HarpsHernandez added her surgical history and her past medical history to my note. Records from her 2 prior psychiatric hospitalizations were reviewed see information above.    Treatment Plan Summary: Daily contact with patient to assess and evaluate symptoms and progress in treatment and Medication management  KAPUR,AARTI KAMAL 06/08/2015, 3:33 PM

## 2015-06-08 NOTE — Plan of Care (Signed)
Problem: Ineffective individual coping Goal: LTG: Patient will report a decrease in negative feelings Outcome: Progressing Patient reports feeling better than upon arrival.  Goal: STG: Patient will remain free from self harm Outcome: Progressing No self harm.  Goal: STG:Pt. will utilize relaxation techniques to reduce stress STG: Patient will utilize relaxation techniques to reduce stress levels  Outcome: Progressing Patient pleasant and interactive in the milieu. Reports feeling better.

## 2015-06-08 NOTE — [facility] Group Notes (Signed)
[facility] Group Notes:  (Nursing/MHT/Case Management/Adjunct)  Date:  06/08/2015  Time:  3:24 PM  Type of Therapy:  Psychoeducational Skills  Participation Level:  Did Not Attend   Lynelle SmokeCara Travis Strong Memorial HospitalMadoni 06/08/2015, 3:24 PM

## 2015-06-09 LAB — GLUCOSE, CAPILLARY
GLUCOSE-CAPILLARY: 127 mg/dL — AB (ref 65–99)
Glucose-Capillary: 121 mg/dL — ABNORMAL HIGH (ref 65–99)
Glucose-Capillary: 139 mg/dL — ABNORMAL HIGH (ref 65–99)
Glucose-Capillary: 140 mg/dL — ABNORMAL HIGH (ref 65–99)
Glucose-Capillary: 44 mg/dL — CL (ref 65–99)
Glucose-Capillary: 49 mg/dL — ABNORMAL LOW (ref 65–99)

## 2015-06-09 LAB — URINE CULTURE: Special Requests: NORMAL

## 2015-06-09 LAB — GLUCOSE, RANDOM
GLUCOSE: 117 mg/dL — AB (ref 65–99)
GLUCOSE: 100 mg/dL — AB (ref 65–99)
Glucose, Bld: 231 mg/dL — ABNORMAL HIGH (ref 65–99)
Glucose, Bld: 88 mg/dL (ref 65–99)

## 2015-06-09 NOTE — Progress Notes (Addendum)
Pt has been pleasant and cooperative. Pt denies SI and A/V hallucinations. Pt has been moving freely about the unit. Pt completed her self inventory were she rated her depression a 7/10. Not attending groups.

## 2015-06-09 NOTE — Plan of Care (Signed)
Problem: Ineffective individual coping Goal: LTG: Patient will report a decrease in negative feelings Outcome: Progressing Pt reports no negative feelings.  Goal: STG: Pt will be able to identify effective and ineffective STG: Pt will be able to identify effective and ineffective coping patterns  Outcome: Not Progressing Patient disoriented to situation, no coping skills have been noted.  Goal: STG: Patient will remain free from self harm Outcome: Progressing No self harm.

## 2015-06-09 NOTE — [facility] Group Notes (Signed)
[facility] LCSW Group Therapy  06/09/2015 12:48 PM  Type of Therapy:  Group Therapy  Participation Level:  Did Not Attend  Modes of Intervention:  Discussion, Education, Socialization and Support  Summary of Progress/Problems:Pt will identify unhealthy thoughts and how they impact their emotions and behavior. Pt will be encouraged to discuss these thoughts, emotions and behaviors with the group.   Sempra EnergyCandace L Marry Kusch  MSW, LCSWA   06/09/2015, 12:48 PM

## 2015-06-09 NOTE — Progress Notes (Signed)
[facility] MD Progress Note  06/09/2015 12:15 PM Danielle Boyer  MRN:  [id number] Subjective:   The patient reports that she has been doing "okay" and insight into paranoid thoughts prior to admission and appears to be limited. The patient says that the deceased person speaking to her occurred several years ago and she now does not fear anyone trying to hurt her or her dog. The patient still has a very difficult time communicating with this writer secondary to not having a hearing aid. Per nursing, there was no hearing aid with her belongings when she came to the hospital. She has not reported any current active or passive suicidal thoughts. She denies any visual hallucinations. She does not appear to be actively psychotic currently. Mood is stable and she has been visible on the unit, interacting appropriately with staff and peers. She denies any physical adverse side effects associated with the medication and appears to be tolerating psychotropic medications fairly well. She denies any new somatic complaints..She slept well last night per nurs  Principal Problem: Schizophrenia (HCC) Diagnosis:   Patient Active Problem List   Diagnosis Date Noted  . 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 Essential hypertension [I10] 06/07/2015  . Hearing loss [H91.90] 06/07/2015  . Hypothyroidism [E03.9] 06/07/2015  . Cardiovascular disease [I25.10] 06/07/2015  . Atrial fibrillation (HCC) [I48.91] 06/07/2015  . GERD (gastroesophageal reflux disease) [K21.9] 06/07/2015  . IBS (irritable bowel syndrome) [K58.9] 06/07/2015  . Osteoarthritis [M19.90] 06/07/2015  . Paranoid schizophrenia (HCC) [F20.0]   . Schizophrenia (HCC) [F20.9] 06/06/2015  . Diabetes (HCC) [E11.9] 03/19/2015   Total Time spent with patient: 20 minutes  Past Psychiatric History: In Feb 2016 she was in our unit for psychosis. In 2011 she stayed in our unit for almost 3 weeks for similar symptoms. She was treated with Risperdal, Haldol,  Depakote, and Zyprexa. There are no suicide attempts. No history of substance use.   Past Medical History: Past Surgical History  Procedure Laterality Date  . Cardiac catheterization  . Stent placement 1999  at [facility]  . Appendectomy  . Hysterectomy  with one ovary removed  Past Medical History  Diagnosis Date  . Diabetes mellitus without complication (HCC)   . Hypertension     Past Surgical History  Procedure Laterality Date  . Abdominal hysterectomy    . Cesarean section      x3   Family History:  Family History  Problem Relation Age of Onset  . Breast cancer Sister    Family Psychiatric History: With depression and anxiety. Her sister is on Xanax.   Social History: She is widowed. She lives by herself with a dog. She believes that the dog is the only other being that is able to hear the voices as reportedly the dog barks when the patient hears the voices. She has a very supportive family. Her younger son, Danielle Boyer, who is the healthcare power of attorney, spends weekends with her. She often goes to the house of Danielle Boyer, her older son, but she refuses to stay there for the night even though the family made arrangements for that. She also has a daughter, Danielle Boyer, who has been bringing the patient to all of her appointments since 2011. She has Medicare and Medicaid. The patient all her life she has been either married or in a relationship and it has been only a couple of years when she does not have a female partner in the house. She still drives a car and visits  her son's bakery in [place] almost daily.        Past Medical History:  Past Medical History  Diagnosis Date  . Diabetes mellitus without complication (HCC)   . Hypertension     Past Surgical History  Procedure Laterality Date  . Abdominal hysterectomy    . Cesarean section      x3   Family History:  Family History  Problem Relation Age of Onset  . Breast cancer  Sister     Social History:  History  Alcohol Use No     History  Drug Use No    Social History   Social History  . Marital Status: Widowed    Spouse Name: N/A  . Number of Children: N/A  . Years of Education: N/A   Social History Main Topics  . Smoking status: Never Smoker   . Smokeless tobacco: Never Used  . Alcohol Use: No  . Drug Use: No  . Sexual Activity: Not Asked   Other Topics Concern  . None   Social History Narrative        Sleep: Good  Appetite:  Good  Current Medications: Current Facility-Administered Medications  Medication Dose Route Frequency Provider Last Rate Last Dose  . acetaminophen (TYLENOL) tablet 650 mg  650 mg Oral Q6H PRN Audery Amel, MD      . alum & mag hydroxide-simeth (MAALOX/MYLANTA) 200-200-20 MG/5ML suspension 30 mL  30 mL Oral Q4H PRN Audery Amel, MD      . aspirin chewable tablet 81 mg  81 mg Oral Daily Jimmy Footman, MD   81 mg at 06/09/15 0840  . [START ON 06/10/2015] cyanocobalamin ((VITAMIN B-12)) injection 1,000 mcg  1,000 mcg Intramuscular Once Darliss Ridgel, MD      . cyanocobalamin ((VITAMIN B-12)) injection 1,000 mcg  1,000 mcg Intramuscular Once Jimmy Footman, MD      . [name] ON 06/11/2015] cyanocobalamin tablet 1,000 mcg  1,000 mcg Oral Daily Darliss Ridgel, MD      . furosemide (LASIX) tablet 20 mg  20 mg Oral Daily Audery Amel, MD   20 mg at 06/09/15 0841  . insulin aspart (novoLOG) injection 0-5 Units  0-5 Units Subcutaneous QHS Jimmy Footman, MD   0 Units at 06/07/15 2129  . insulin aspart (novoLOG) injection 0-9 Units  0-9 Units Subcutaneous TID WC Jimmy Footman, MD   Stopped at 06/09/15 1020  . insulin aspart protamine- aspart (NOVOLOG MIX 70/30) injection 20 Units  20 Units Subcutaneous Q supper Audery Amel, MD   20 Units at 06/08/15 1719  . insulin aspart protamine- aspart (NOVOLOG MIX 70/30) injection 30 Units  30 Units Subcutaneous Q breakfast Darliss Ridgel, MD   30 Units at 06/09/15 [phone]  . levothyroxine (SYNTHROID, LEVOTHROID) tablet 50 mcg  50 mcg Oral QAC breakfast Jimmy Footman, MD   50 mcg at 06/09/15 [phone]  . magnesium hydroxide (MILK OF MAGNESIA) suspension 30 mL  30 mL Oral Daily PRN Audery Amel, MD      . metFORMIN (GLUCOPHAGE) tablet 1,000 mg  1,000 mg Oral BID WC Audery Amel, MD   1,000 mg at 06/09/15 0837  . metoprolol tartrate (LOPRESSOR) tablet 50 mg  50 mg Oral BID Audery Amel, MD   50 mg at 06/09/15 0839  . nitroGLYCERIN (NITROSTAT) SL tablet 0.4 mg  0.4 mg Sublingual Q5 min PRN Jimmy Footman, MD      . OLANZapine [facility]) tablet 10 mg  10 mg Oral QHS Audery Amel, MD   10 mg at 06/08/15 2126  . pantoprazole (PROTONIX) EC tablet 40 mg  40 mg Oral Daily Audery Amel, MD   40 mg at 06/09/15 0841  . potassium chloride SA (K-DUR,KLOR-CON) CR tablet 20 mEq  20 mEq Oral Daily Audery Amel, MD   20 mEq at 06/09/15 0840  . ranolazine (RANEXA) 12 hr tablet 500 mg  500 mg Oral BID Audery Amel, MD   500 mg at 06/09/15 0839  . traZODone (DESYREL) tablet 100 mg  100 mg Oral QHS Audery Amel, MD   100 mg at 06/08/15 2127    Lab Results:  Results for orders placed or performed during the hospital encounter of 06/06/15 (from the past 48 hour(s))  Thyroid Panel With TSH     Status: Abnormal   Collection Time: 06/07/15  4:06 PM  Result Value Ref Range   TSH 9.720 (H) 0.450 - 4.500 uIU/mL   T4, Total 7.1 4.5 - 12.0 ug/dL   T3 Uptake Ratio 24 24 - 39 %   Free Thyroxine Index 1.7 1.2 - 4.9    Comment: (NOTE) Performed At: [facility] [address] [place], [place] [id number] Mila Homer MD [id number]   Urine culture     Status: None   Collection Time: 06/07/15  4:06 PM  Result Value Ref Range   Specimen Description URINE, CLEAN CATCH    Special Requests Normal    Culture MULTIPLE SPECIES PRESENT, SUGGEST RECOLLECTION    Report Status 06/09/2015 FINAL   Ammonia     Status:  None   Collection Time: 06/07/15  4:06 PM  Result Value Ref Range   Ammonia 19 9 - 35 umol/L  HIV antibody     Status: None   Collection Time: 06/07/15  4:06 PM  Result Value Ref Range   HIV Screen 4th Generation wRfx Non Reactive Non Reactive    Comment: (NOTE) Performed At: [facility] [address] [place], [place] [id number] Mila Homer MD [id number]   RPR     Status: None   Collection Time: 06/07/15  4:06 PM  Result Value Ref Range   RPR Ser Ql Non Reactive Non Reactive    Comment: (NOTE) Performed At: [facility] [address] [place], [place] [id number] Mila Homer MD [id number]   Vitamin B12     Status: None   Collection Time: 06/07/15  4:06 PM  Result Value Ref Range   Vitamin B-12 258 180 - 914 pg/mL    Comment: (NOTE) This assay is not validated for testing neonatal or myeloproliferative syndrome specimens for Vitamin B12 levels. Performed at [facility]   Glucose, random     Status: Abnormal   Collection Time: 06/07/15  4:07 PM  Result Value Ref Range   Glucose, Bld 177 (H) 65 - 99 mg/dL  Glucose, capillary     Status: Abnormal   Collection Time: 06/07/15  4:45 PM  Result Value Ref Range   Glucose-Capillary 148 (H) 65 - 99 mg/dL  Glucose, random     Status: Abnormal   Collection Time: 06/07/15 11:42 PM  Result Value Ref Range   Glucose, Bld 61 (L) 65 - 99 mg/dL  Glucose, random     Status: None   Collection Time: 06/08/15  6:44 AM  Result Value Ref Range   Glucose, Bld 96 65 - 99 mg/dL  Glucose, capillary     Status: None  Collection Time: 06/08/15  7:00 AM  Result Value Ref Range   Glucose-Capillary 96 65 - 99 mg/dL  Glucose, random     Status: None   Collection Time: 06/08/15 11:51 AM  Result Value Ref Range   Glucose, Bld 70 65 - 99 mg/dL  Glucose, capillary     Status: Abnormal   Collection Time: 06/08/15 12:02 PM  Result Value Ref Range   Glucose-Capillary 63 (L) 65 - 99 mg/dL   Comment 1 Notify  RN    Comment 2 Document in Chart   Glucose, capillary     Status: Abnormal   Collection Time: 06/08/15  4:33 PM  Result Value Ref Range   Glucose-Capillary 54 (L) 65 - 99 mg/dL  Glucose, capillary     Status: Abnormal   Collection Time: 06/08/15  4:35 PM  Result Value Ref Range   Glucose-Capillary 57 (L) 65 - 99 mg/dL  Glucose, capillary     Status: Abnormal   Collection Time: 06/08/15  5:16 PM  Result Value Ref Range   Glucose-Capillary 131 (H) 65 - 99 mg/dL  Glucose, random     Status: Abnormal   Collection Time: 06/08/15  6:13 PM  Result Value Ref Range   Glucose, Bld 150 (H) 65 - 99 mg/dL  Glucose, capillary     Status: Abnormal   Collection Time: 06/08/15  9:21 PM  Result Value Ref Range   Glucose-Capillary 43 (LL) 65 - 99 mg/dL  Glucose, capillary     Status: Abnormal   Collection Time: 06/08/15  9:34 PM  Result Value Ref Range   Glucose-Capillary 63 (L) 65 - 99 mg/dL  Glucose, capillary     Status: None   Collection Time: 06/08/15 10:07 PM  Result Value Ref Range   Glucose-Capillary 82 65 - 99 mg/dL  Glucose, random     Status: None   Collection Time: 06/09/15 12:00 AM  Result Value Ref Range   Glucose, Bld 88 65 - 99 mg/dL  Glucose, random     Status: Abnormal   Collection Time: 06/09/15  6:04 AM  Result Value Ref Range   Glucose, Bld 117 (H) 65 - 99 mg/dL  Glucose, capillary     Status: Abnormal   Collection Time: 06/09/15  6:55 AM  Result Value Ref Range   Glucose-Capillary 121 (H) 65 - 99 mg/dL  Glucose, capillary     Status: Abnormal   Collection Time: 06/09/15 11:49 AM  Result Value Ref Range   Glucose-Capillary 139 (H) 65 - 99 mg/dL    Physical Findings: AIMS: Facial and Oral Movements Muscles of Facial Expression: None, normal Lips and Perioral Area: None, normal Jaw: None, normal Tongue: None, normal,Extremity Movements Upper (arms, wrists, hands, fingers): None, normal Lower (legs, knees, ankles, toes): None, normal, Trunk Movements Neck,  shoulders, hips: None, normal, Overall Severity Severity of abnormal movements (highest score from questions above): None, normal Incapacitation due to abnormal movements: None, normal Patient's awareness of abnormal movements (rate only patient's report): No Awareness, Dental Status Current problems with teeth and/or dentures?: No Does patient usually wear dentures?: Yes  CIWA:    COWS:     Musculoskeletal: Strength & Muscle Tone: within normal limits Gait & Station: normal Patient leans: N/A  Psychiatric Specialty Exam: Review of Systems  Constitutional: Negative for fever, chills, weight loss, malaise/fatigue and diaphoresis.  HENT: Positive for hearing loss. Negative for congestion, ear discharge, ear pain, nosebleeds, sore throat and tinnitus.        She  is missing her hearing aid.  Eyes: Negative for blurred vision, double vision, photophobia, pain and discharge.  Respiratory: Negative for cough, hemoptysis, sputum production, shortness of breath and wheezing.   Cardiovascular: Negative for chest pain, palpitations, orthopnea, leg swelling and PND.  Gastrointestinal: Negative for heartburn, nausea, vomiting, abdominal pain, diarrhea, constipation and blood in stool.  Genitourinary: Negative for dysuria, urgency, frequency and hematuria.  Musculoskeletal: Negative for myalgias, back pain, joint pain, falls and neck pain.  Skin: Negative for itching and rash.  Neurological: Negative for dizziness, tingling, tremors, sensory change, focal weakness, seizures, loss of consciousness, weakness and headaches.  Endo/Heme/Allergies: Does not bruise/bleed easily.    Blood pressure 129/69, pulse 90, temperature 98.1 F (36.7 C), temperature source Oral, resp. rate 20, height 5\' 3"  (1.6 m), weight 69.854 kg (154 lb).Body mass index is 27.29 kg/(m^2).  General Appearance: Casual  Eye Contact::  Good  Speech:  Clear and Coherent and Normal Rate  Volume:  Normal  Mood:  OK  Affect:  Calm  and congruent.  Thought Process:  She had a difficult time hearing  Orientation:  Other:  Oriented to person and place and not time  Thought Content:  Not clear as there is a lot of difficulty with commmunication without her hearing aid  Suicidal Thoughts:  No  Homicidal Thoughts:  No  Memory:  Immediate;   Poor Recent;   Poor Remote;   Poor  Judgement:  Impaired  Insight:  Lacking  Psychomotor Activity:  Slow gait  Concentration:  Fair  Recall:  Poor  Fund of Knowledge:Fair  Language: Fair  Akathisia:  No  Handed:  Right  AIMS (if indicated):     Assets:  Desire for Improvement Housing Physical Health  ADL's:  Intact  Cognition: Impaired,  Mild  Sleep:  Number of Hours: [phone]    74 year old female with history of schizophrenia versus bipolar disorder who has presented to our emergency department twice over the last couple of days reporting psychotic symptoms. I suspect patient is noncompliant with her medications as a means by an elevated TSH, an elevated hemoglobin A1c and current psychotic symptoms despite being prescribed with olanzapine 10 mg daily at bedtime.  Schizophrenia: Will continue Zyprexa 10 mg daily at bedtime. With her difficulty with hearing it is difficult to do a full assessment. Total cholesterol was 236.  Angina:Continue Ranexa 500 mg by mouth twice a day. Patient will also be continued on nitroglycerin when necessary.  Cardiovascular disease: continue ASA 81 mg /d. Vital signs are stable.  Hypertension: Held Metoprolol 50 mg by mouth twice a day due to BP being low this morning. She may need a lower dose and was not compliant as an outpatient.  Edema: Continue Lasix 20 mg a day  Hypokalemia: Continue potassium 20 mEq a day  Diabetes: Blood sugars have been fluctuating. Insulin was decreased to 30 units in the morning and 20 units with dinner because she was dropping too low. .. Continue insulin 70/30. Marland Kitchen. She will also will be continued on metformin  1000mg  po BID. Hemoglobin A1c is about 7. I will order supplemental insulin. Capillary blood glucose will be check 4 times a day. Consider medicine consult if does not stablilize.  Hypothyroidism: Back in February patient was on Synthroid 50 g a day. This medication was not listed as part of her home medicines. TSH was below 10. I will restart synthroid 50 mcg/d. I will check TSH and T4 and T3  GERD: Continue daily PPI  Vital signs will be check every 12 hours.  Diet: I will change her diet to low sodium and carb modified  Precautions every 15 minute checks  Hospitalization and status: Continue involuntary commitment  Low B12: B12 level of 258. Will add B-12 IM x 3 days and then orally.  Labs: I we'll recheck TSH, T4, T3. I will check HIV, RPR. I will check a follow-up basic metabolic panel on Monday. I will check today a urine culture as she has repetitive history of UTIs.  EKG: I will also order an EKG as patient has history of heart disease.---Reviewed WNL  Records from [facility] outpatient clinic [facility]) were reviewed by Dr Ardyth Harps.  Dr Ardyth Harps added her surgical history and her past medical history to my note. Records from her 2 prior psychiatric hospitalizations were reviewed see information above.    Treatment Plan Summary: Daily contact with patient to assess and evaluate symptoms and progress in treatment and Medication management  Ori Trejos KAMAL 06/09/2015, 12:15 PM

## 2015-06-10 ENCOUNTER — Inpatient Hospital Stay: Payer: 59

## 2015-06-10 LAB — GLUCOSE, CAPILLARY
GLUCOSE-CAPILLARY: 149 mg/dL — AB (ref 65–99)
Glucose-Capillary: 116 mg/dL — ABNORMAL HIGH (ref 65–99)
Glucose-Capillary: 126 mg/dL — ABNORMAL HIGH (ref 65–99)
Glucose-Capillary: 126 mg/dL — ABNORMAL HIGH (ref 65–99)
Glucose-Capillary: 282 mg/dL — ABNORMAL HIGH (ref 65–99)

## 2015-06-10 LAB — BASIC METABOLIC PANEL
Anion gap: 10 (ref 5–15)
BUN: 18 mg/dL (ref 6–20)
CALCIUM: 9.2 mg/dL (ref 8.9–10.3)
CO2: 25 mmol/L (ref 22–32)
CREATININE: 0.87 mg/dL (ref 0.44–1.00)
Chloride: 97 mmol/L — ABNORMAL LOW (ref 101–111)
GFR calc Af Amer: >60 mL/min (ref >60)
Glucose, Bld: 179 mg/dL — ABNORMAL HIGH (ref 65–99)
Potassium: 4.3 mmol/L (ref 3.5–5.1)
SODIUM: 132 mmol/L — AB (ref 135–145)

## 2015-06-10 LAB — GLUCOSE, RANDOM
GLUCOSE: 131 mg/dL — AB (ref 65–99)
GLUCOSE: 148 mg/dL — AB (ref 65–99)

## 2015-06-10 MED ORDER — METFORMIN HCL 500 MG PO TABS
500.0000 mg | ORAL_TABLET | Freq: Two times a day (BID) | ORAL | Status: DC
  Administered 2015-06-10 – 2015-06-12 (×4): 500 mg via ORAL
  Filled 2015-06-10 (×4): qty 1

## 2015-06-10 MED ORDER — INSULIN ASPART PROT & ASPART (70-30 MIX) 100 UNIT/ML ~~LOC~~ SUSP
10.0000 [IU] | Freq: Every day | SUBCUTANEOUS | Status: DC
  Administered 2015-06-10 – 2015-06-11 (×2): 10 [IU] via SUBCUTANEOUS
  Filled 2015-06-10 (×2): qty 10

## 2015-06-10 MED ORDER — INSULIN ASPART PROT & ASPART (70-30 MIX) 100 UNIT/ML ~~LOC~~ SUSP
20.0000 [IU] | Freq: Every day | SUBCUTANEOUS | Status: DC
  Administered 2015-06-11 – 2015-06-12 (×2): 20 [IU] via SUBCUTANEOUS
  Filled 2015-06-10: qty 20

## 2015-06-10 MED ORDER — ATORVASTATIN CALCIUM 20 MG PO TABS
40.0000 mg | ORAL_TABLET | Freq: Every day | ORAL | Status: DC
  Administered 2015-06-10 – 2015-06-11 (×2): 40 mg via ORAL
  Filled 2015-06-10 (×2): qty 2

## 2015-06-10 NOTE — Progress Notes (Signed)
Pt today has offered no c/o , she was seen by medical DR for consult for diab, she has been awake most of the day with no c/o. She denies si/hi or avh she  Ambulates well and appears in no distress,she interacts with peers well is alert and oriented

## 2015-06-10 NOTE — Progress Notes (Signed)
Inpatient Diabetes Program Recommendations  AACE/ADA: New Consensus Statement on Inpatient Glycemic Control (2015)  Target Ranges:  Prepandial:   less than 140 mg/dL      Peak postprandial:   less than 180 mg/dL (1-2 hours)      Critically ill patients:  140 - 180 mg/dL   Review of Glycemic Control:  Results for [name], Danielle Boyer (MRN [id number]) as of 06/10/2015 09:44  Ref. Range 06/09/2015 06:55 06/09/2015 11:49 06/09/2015 17:07 06/09/2015 20:38 06/09/2015 20:58 06/09/2015 21:34 06/10/2015 00:31 06/10/2015 07:10  Glucose-Capillary Latest Ref Range: 65-99 mg/dL 409121 (H) 811139 (H) 914140 (H) 49 (L) 44 (LL) 127 (H) 126 (H) 149 (H)   Diabetes history: Diabetes Outpatient Diabetes medications: Humulin 70/30 40 units in the AM and 20 units in the PM Current orders for Inpatient glycemic control:  Novolog sensitive tid with meals and HS Novolog 70/30 mix- 30 units in the AM and 20 units q PM  Inpatient Diabetes Program Recommendations:    Note that per documentation, patient's meal intake is inconsistent.  Last evening she was administered 20 units of Novolog 70/30 mix however did not have any recorded intake.  Blood glucose dropped at bedtime.  Note that Hospitalist's consult ordered.  Consider reducing Novolog 70/30 mix further to 25 units q AM and 10 units q PM.  Also patient must be encouraged to eat with 70/30 doses since there is meal coverage included in pre-mixed insulin.  Thanks, Beryl MeagerJenny Priscella Donna, RN, BC-ADM Inpatient Diabetes Coordinator Pager [phone] (8a-5p)

## 2015-06-10 NOTE — Plan of Care (Signed)
Problem: Alteration in thought process Goal: STG-Patient is able to follow short directions Outcome: Progressing When asked to come and take medications she follows directions.

## 2015-06-10 NOTE — Progress Notes (Signed)
D: Patient affect flat. Patient denies SI/HI/AVH. Patient denies pain. Patient hard of hearing. Requires redirection. When assessing patient she stated, " A: Medication given and education provided. Wrote on paper to communicate with patient.  R: Patient has been very pleasant. Compliant with medication. Calm and pleasant. Safety maintained with 15 min checks.

## 2015-06-10 NOTE — Progress Notes (Signed)
Hospitalist consult placed to help control blood sugars as she is getting hypoglycemic with checks

## 2015-06-10 NOTE — Consult Note (Signed)
[facility] [place] [facility]   PATIENT NAME: Danielle Boyer    MR#:  [id number]  DATE OF BIRTH:  07/31/1940  DATE OF ADMISSION:  06/06/2015  PRIMARY CARE PHYSICIAN: SPARKS,JEFFREY D, MD   REQUESTING/REFERRING PHYSICIAN: Dr. Edwin DadaKapoor.  CHIEF COMPLAINT:  No chief complaint on file.   HISTORY OF PRESENT ILLNESS: Danielle Boyer  is a 74 y.o. female with a known history of coronary artery disease status post and, diabetes, hypertension, hyperlipidemia- admitted to behavioral unit for schizophrenia, medical consult was called in as patient's blood sugar level is going less than 50 for last 2 evenings. Patient denies any symptoms, but she said at home she does not watch her diet and eat sweets sometime. She said she takes insulin 50 units in the morning and 40 units in the evening time. And she also take some tablet 2 times a day for her diabetes at home. Here in hospital she is on metformin 1000 milligram twice a day, insulin 70/30- 30 units in morning and 20 units in evening. She has hearing deficit and does not have her hearing aid right now, but able to communicate very well with her by writing down my questions on the piece of paper.  PAST MEDICAL HISTORY:   Past Medical History  Diagnosis Date  . Diabetes mellitus without complication (HCC)   . Hypertension     PAST SURGICAL HISTORY:  Past Surgical History  Procedure Laterality Date  . Abdominal hysterectomy    . Cesarean section      x3    SOCIAL HISTORY:  Social History  Substance Use Topics  . Smoking status: Never Smoker   . Smokeless tobacco: Never Used  . Alcohol Use: No    FAMILY HISTORY:  Family History  Problem Relation Age of Onset  . Breast cancer Sister     DRUG ALLERGIES:  Allergies  Allergen Reactions  . Citalopram Other (See Comments)    Reaction:  Unknown   . Penicillins Rash and Other (See Comments)    Unable to obtain enough information to answer additional questions about  this medication.      REVIEW OF SYSTEMS:   CONSTITUTIONAL: No fever, fatigue or weakness.  EYES: No blurred or double vision.  EARS, NOSE, AND THROAT: No tinnitus or ear pain.  RESPIRATORY: No cough, shortness of breath, wheezing or hemoptysis.  CARDIOVASCULAR: No chest pain, orthopnea, edema.  GASTROINTESTINAL: No nausea, vomiting, diarrhea or abdominal pain.  GENITOURINARY: No dysuria, hematuria.  ENDOCRINE: No polyuria, nocturia,  HEMATOLOGY: No anemia, easy bruising or bleeding SKIN: No rash or lesion. MUSCULOSKELETAL: No joint pain or arthritis.   NEUROLOGIC: No tingling, numbness, weakness.  PSYCHIATRY: No anxiety or depression.   MEDICATIONS AT HOME:  Prior to Admission medications   Medication Sig Start Date End Date Taking? Authorizing Provider  furosemide (LASIX) 20 MG tablet Take 20 mg by mouth daily.   Yes Historical Provider, MD  haloperidol (HALDOL) 0.5 MG tablet Take 0.5 mg by mouth 2 (two) times daily.   Yes Historical Provider, MD  insulin NPH-regular Human (HUMULIN 70/30) (70-30) 100 UNIT/ML injection Inject 20-40 Units into the skin 2 (two) times daily. Pt uses 40 units in the morning and 20 units in the evening.   Yes Historical Provider, MD  metFORMIN (GLUCOPHAGE) 1000 MG tablet Take 1,000 mg by mouth 2 (two) times daily.   Yes Historical Provider, MD  metoprolol (LOPRESSOR) 50 MG tablet Take 50 mg by mouth 2 (two) times daily.  Yes Historical Provider, MD  nitroGLYCERIN (NITROSTAT) 0.4 MG SL tablet Place 0.4 mg under the tongue every 5 (five) minutes as needed for chest pain.   Yes Historical Provider, MD  OLANZapine (ZYPREXA) 10 MG tablet Take 1 tablet (10 mg total) by mouth at bedtime. 06/04/15  Yes Audery Amel, MD  omeprazole (PRILOSEC) 20 MG capsule Take 20 mg by mouth 2 (two) times daily.   Yes Historical Provider, MD  potassium chloride SA (K-DUR,KLOR-CON) 20 MEQ tablet Take 20 mEq by mouth daily.   Yes Historical Provider, MD  ranolazine (RANEXA) 500 MG  12 hr tablet Take 500 mg by mouth 2 (two) times daily.   Yes Historical Provider, MD  traZODone (DESYREL) 100 MG tablet Take 100 mg by mouth at bedtime.   Yes Historical Provider, MD      PHYSICAL EXAMINATION:   VITAL SIGNS: Blood pressure 113/71, pulse 81, temperature 98.4 F (36.9 C), temperature source Oral, resp. rate 20, height  (1.6 m), weight 69.854 kg (154 lb).  GENERAL:  74 y.o.-year-old patient lying in the bed with no acute distress.  EYES: Pupils equal, round, reactive to light and accommodation. No scleral icterus. Extraocular muscles intact.  HEENT: Head atraumatic, normocephalic. Oropharynx and nasopharynx clear. Hearing deficit. NECK:  Supple, no jugular venous distention. No thyroid enlargement, no tenderness.  LUNGS: Normal breath sounds bilaterally, no wheezing, rales,rhonchi or crepitation. No use of accessory muscles of respiration.  CARDIOVASCULAR: S1, S2 normal. No murmurs, rubs, or gallops.  ABDOMEN: Soft, nontender, nondistended. Bowel sounds present. No organomegaly or mass.  EXTREMITIES: No pedal edema, cyanosis, or clubbing.  NEUROLOGIC: Cranial nerves II through XII are intact. Muscle strength 5/5 in all extremities. Sensation intact. Gait not checked.  PSYCHIATRIC: The patient is alert and oriented x 3.  SKIN: No obvious rash, lesion, or ulcer.   LABORATORY PANEL:   CBC  Recent Labs Lab 06/03/15 2309 06/05/15 1932  WBC 9.2 9.3  HGB 11.9* 12.2  HCT 36.0 36.4  PLT 324 313  MCV 92.4 93.7  MCH 30.5 31.3  MCHC 33.0 33.4  RDW 15.4* 15.6*  LYMPHSABS  --  2.2  MONOABS  --  0.6  EOSABS  --  0.2  BASOSABS  --  0.1   ------------------------------------------------------------------------------------------------------------------  Chemistries   Recent Labs Lab 06/03/15 2309 06/05/15 1932  06/09/15 0604 06/09/15 1205 06/09/15 1741 06/10/15 0006 06/10/15 0709  NA 142 136  --   --   --   --   --   --   K 3.5 3.9  --   --   --   --   --    --   CL 106 99*  --   --   --   --   --   --   CO2 30 22  --   --   --   --   --   --   GLUCOSE 154* 329*  < > 117* 100* 231* 131* 148*  BUN 10 19  --   --   --   --   --   --   CREATININE 0.74 0.93  --   --   --   --   --   --   CALCIUM 10.1 9.3  --   --   --   --   --   --   AST 19 23  --   --   --   --   --   --  ALT 12* 12*  --   --   --   --   --   --   ALKPHOS 62 59  --   --   --   --   --   --   BILITOT 0.6 0.6  --   --   --   --   --   --   < > = values in this interval not displayed. ------------------------------------------------------------------------------------------------------------------ estimated creatinine clearance is 48.3 mL/min (by C-G formula based on Cr of 0.93). ------------------------------------------------------------------------------------------------------------------  Recent Labs  06/07/15 1606  TSH 9.720*  T4TOTAL 7.1     Coagulation profile No results for input(s): INR, PROTIME in the last 168 hours. ------------------------------------------------------------------------------------------------------------------- No results for input(s): DDIMER in the last 72 hours. -------------------------------------------------------------------------------------------------------------------  Cardiac Enzymes No results for input(s): CKMB, TROPONINI, MYOGLOBIN in the last 168 hours.  Invalid input(s): CK ------------------------------------------------------------------------------------------------------------------ Invalid input(s): POCBNP  ---------------------------------------------------------------------------------------------------------------  Urinalysis    Component Value Date/Time   COLORURINE YELLOW* 06/05/2015 2252   APPEARANCEUR CLEAR* 06/05/2015 2252   LABSPEC 1.012 06/05/2015 2252   PHURINE 5.0 06/05/2015 2252   GLUCOSEU 150* 06/05/2015 2252   HGBUR NEGATIVE 06/05/2015 2252   BILIRUBINUR NEGATIVE 06/05/2015 2252   KETONESUR  NEGATIVE 06/05/2015 2252   PROTEINUR NEGATIVE 06/05/2015 2252   NITRITE NEGATIVE 06/05/2015 2252   LEUKOCYTESUR 1+* 06/05/2015 2252     RADIOLOGY: No results found.   IMPRESSION AND PLAN:  * Diabetes and now hypoglycemia  She is on diabetic diet here in hospital.  At home she was not watching her diet.  I decreased her dose of insulin with both times in the morning and in the evening.  And also decreased her metformin to 500 mg twice a day.  Advised to continue monitor her blood sugar level before meals and at nighttime,  I also advised her at home to be strict with her diet and keep a record of her blood sugar level to take it with her primary care for the adjusting her insulin dose.  I also called a consult for diabetes coordinator.  * Coronary artery disease  She is on aspirin and metoprolol- continue that.  Not taking any statins as per home medication records, I started on atorvastatin.  * Hypertension  Blood pressure is under control with home medication, continue that.  * Hyperlipidemia  LDL is more than 100, started on atorvastatin, as she had a coronary artery disease.  * Hypothyroidism  TSH is high, she is started on levothyroxine 50 g.  Advised to check TSH with primary care physician in office after a month.  All the records are reviewed and case discussed with ED provider. Management plans discussed with the patient, family and they are in agreement.  CODE STATUS:    Code Status Orders        Start     Ordered   06/06/15 1345  Full code   Continuous     06/06/15 1346       TOTAL TIME TAKING CARE OF THIS PATIENT: 50 minutes.    Altamese Dilling M.D on 06/10/2015   Between 7am to 6pm - Pager - [phone]  After 6pm go to [email or web address] - password [email or web address] [facility]  [facility]  Office  [phone]  CC: Primary care physician; Marguarite Arbour, MD   Note: This dictation was prepared with Dragon dictation along with smaller  phrase technology. Any transcriptional errors that result from this process are unintentional.

## 2015-06-10 NOTE — Progress Notes (Signed)
Pt seems to be becoming more forgetful and disoriented as the evening comes

## 2015-06-10 NOTE — [facility] Group Notes (Signed)
[facility] Planning [facility] Note   06/10/2015 10:59 AM  Participation Quality:  Did not attend group   Lulu RidingIngle, Jaleisa Brose T, MSW, LCSWA

## 2015-06-10 NOTE — Progress Notes (Signed)
Recreation Therapy Notes  Date: 12.05.16 Time: 3:00 pm Location: Craft Room  Group Topic: Self-expression  Goal Area(s) Addresses:  Patient will be able to identify a color that represents each emotion. Patient will verbalize benefit of using art as a means of self-expression. Patient will verbalize one positive emotion experienced while participating in activity.  Behavioral Response: Did not attend  Intervention: The Colors Within Me  Activity: Patients were given a blank face worksheet and instructed to analyze the emotions they are experiencing, pick a color for each emotion, and show on the worksheet how much of that emotion they are experiencing.  Education: LRT educated patients on different forms of self-expression.  Education Outcome: Patient did not attend group.   Clinical Observations/Feedback: Patient did not attend group.  Jacquelynn CreeGreene,[place] Urieta M, LRT/CTRS 06/10/2015 4:37 PM

## 2015-06-10 NOTE — [facility] Group Notes (Signed)
[facility] Group Notes:  (Nursing/MHT/Case Management/Adjunct)  Date:  06/10/2015  Time:  1:25 PM  Type of Therapy: Didn't come   Participation Le Summary of Progress/Problems:  Danielle Boyer 06/10/2015, 1:25 PM

## 2015-06-10 NOTE — Progress Notes (Signed)
[facility] MD Progress Note  06/10/2015 5:17 PM Danielle Boyer  MRN:  [id number] Subjective:  Hearing loss Difficult to assess this patient. She'llurinate in her ability to understand the staff is very limited. She denies having auditory hallucinations she says she is no longer hearing voices and does not have any thoughts of anybody want to hurt her dog or herself people wanting to hurt her or her dog.  She denies side effects from medications. She denies problems with mood, appetite, energy or concentration. She denies auditory or visual hallucinations, SI or HI.  Patient has been pleasant and cooperative.  We will need to contact her son who is her healthcare power of attorney and her major support in the community.  Per nursing: Pt has been pleasant and cooperative. Pt denies SI and A/V hallucinations. Pt has been moving freely about the unit. Pt completed her self inventory were she rated her depression a 7/10. Not attending groups.  Principal Problem: Schizophrenia (HCC) Diagnosis:   Patient Active Problem List   Diagnosis Date Noted  . 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 Essential hypertension [I10] 06/07/2015  . Hearing loss [H91.90] 06/07/2015  . Hypothyroidism [E03.9] 06/07/2015  . Cardiovascular disease [I25.10] 06/07/2015  . Atrial fibrillation (HCC) [I48.91] 06/07/2015  . GERD (gastroesophageal reflux disease) [K21.9] 06/07/2015  . IBS (irritable bowel syndrome) [K58.9] 06/07/2015  . Osteoarthritis [M19.90] 06/07/2015  . Paranoid schizophrenia (HCC) [F20.0]   . Schizophrenia (HCC) [F20.9] 06/06/2015  . Diabetes (HCC) [E11.9] 03/19/2015   Total Time spent with patient: 20 minutes  Past Psychiatric History: In Feb 2016 she was in our unit for psychosis. In 2011 she stayed in our unit for almost 3 weeks for similar symptoms. She was treated with Risperdal, Haldol, Depakote, and Zyprexa. There are no suicide attempts. No history of substance use.   Past Medical History:  Past Surgical History  Procedure Laterality Date  . Cardiac catheterization  . Stent placement 1999  at [facility]  . Appendectomy  . Hysterectomy  with one ovary removed  Past Medical History  Diagnosis Date  . Diabetes mellitus without complication (HCC)   . Hypertension     Past Surgical History  Procedure Laterality Date  . Abdominal hysterectomy    . Cesarean section      x3   Family History:  Family History  Problem Relation Age of Onset  . Breast cancer Sister    Family Psychiatric History: With depression and anxiety. Her sister is on Xanax.   Social History: She is widowed. She lives by herself with a dog. She believes that the dog is the only other being that is able to hear the voices as reportedly the dog barks when the patient hears the voices. She has a very supportive family. Her younger son, [name], who is the healthcare power of attorney, spends weekends with her. She often goes to the house of [name], her older son, but she refuses to stay there for the night even though the family made arrangements for that. She also has a daughter, [name], who has been bringing the patient to all of her appointments since 2011. She has Medicare and Medicaid. The patient all her life she has been either married or in a relationship and it has been only a couple of years when she does not have a female partner in the house. She still drives a car and visits her son's bakery in [place] almost daily.  Past Medical History:  Past Medical History  Diagnosis Date  . Diabetes mellitus without complication (HCC)   . Hypertension     Past Surgical History  Procedure Laterality Date  . Abdominal hysterectomy    . Cesarean section      x3   Family History:  Family History  Problem Relation Age of Onset  . Breast cancer Sister     Social History:  History  Alcohol Use No     History  Drug Use No    Social History    Social History  . Marital Status: Widowed    Spouse Name: N/A  . Number of Children: N/A  . Years of Education: N/A   Social History Main Topics  . Smoking status: Never Smoker   . Smokeless tobacco: Never Used  . Alcohol Use: No  . Drug Use: No  . Sexual Activity: Not Asked   Other Topics Concern  . None   Social History Narrative        Sleep: Good  Appetite:  Good  Current Medications: Current Facility-Administered Medications  Medication Dose Route Frequency Provider Last Rate Last Dose  . acetaminophen (TYLENOL) tablet 650 mg  650 mg Oral Q6H PRN Audery AmelJohn T Clapacs, MD      . alum & mag hydroxide-simeth (MAALOX/MYLANTA) 200-200-20 MG/5ML suspension 30 mL  30 mL Oral Q4H PRN Audery AmelJohn T Clapacs, MD      . aspirin chewable tablet 81 mg  81 mg Oral Daily Jimmy FootmanAndrea Hernandez-Gonzalez, MD   81 mg at 06/10/15 [id number]  . atorvastatin (LIPITOR) tablet 40 mg  40 mg Oral q1800 Altamese DillingVaibhavkumar Vachhani, MD   40 mg at 06/10/15 1702  . [START ON 06/11/2015] cyanocobalamin tablet 1,000 mcg  1,000 mcg Oral Daily Darliss RidgelAarti K Kapur, MD      . furosemide (LASIX) tablet 20 mg  20 mg Oral Daily Audery AmelJohn T Clapacs, MD   20 mg at 06/10/15 [id number]  . insulin aspart (novoLOG) injection 0-5 Units  0-5 Units Subcutaneous QHS Jimmy FootmanAndrea Hernandez-Gonzalez, MD   0 Units at 06/07/15 2129  . insulin aspart (novoLOG) injection 0-9 Units  0-9 Units Subcutaneous TID WC Jimmy FootmanAndrea Hernandez-Gonzalez, MD   5 Units at 06/10/15 1222  . insulin aspart protamine- aspart (NOVOLOG MIX 70/30) injection 10 Units  10 Units Subcutaneous Q supper Altamese DillingVaibhavkumar Vachhani, MD   10 Units at 06/10/15 1701  . [START ON 06/11/2015] insulin aspart protamine- aspart (NOVOLOG MIX 70/30) injection 20 Units  20 Units Subcutaneous Q breakfast Altamese DillingVaibhavkumar Vachhani, MD      . levothyroxine (SYNTHROID, LEVOTHROID) tablet 50 mcg  50 mcg Oral QAC breakfast Jimmy FootmanAndrea Hernandez-Gonzalez, MD   50 mcg at 06/10/15 [id number]  . magnesium hydroxide (MILK OF MAGNESIA) suspension 30 mL   30 mL Oral Daily PRN Audery AmelJohn T Clapacs, MD      . metFORMIN (GLUCOPHAGE) tablet 500 mg  500 mg Oral BID WC Altamese DillingVaibhavkumar Vachhani, MD   500 mg at 06/10/15 1702  . metoprolol tartrate (LOPRESSOR) tablet 50 mg  50 mg Oral BID Audery AmelJohn T Clapacs, MD   50 mg at 06/10/15 0924  . nitroGLYCERIN (NITROSTAT) SL tablet 0.4 mg  0.4 mg Sublingual Q5 min PRN Jimmy FootmanAndrea Hernandez-Gonzalez, MD      . OLANZapine (ZYPREXA) tablet 10 mg  10 mg Oral QHS Audery AmelJohn T Clapacs, MD   10 mg at 06/09/15 2135  . pantoprazole (PROTONIX) EC tablet 40 mg  40 mg Oral Daily Audery AmelJohn T Clapacs, MD   40 mg at 06/10/15  1610  . potassium chloride SA (K-DUR,KLOR-CON) CR tablet 20 mEq  20 mEq Oral Daily Audery Amel, MD   20 mEq at 06/10/15 9604  . ranolazine (RANEXA) 12 hr tablet 500 mg  500 mg Oral BID Audery Amel, MD   500 mg at 06/10/15 5409  . traZODone (DESYREL) tablet 100 mg  100 mg Oral QHS Audery Amel, MD   100 mg at 06/09/15 2135    Lab Results:  Results for orders placed or performed during the hospital encounter of 06/06/15 (from the past 48 hour(s))  Glucose, random     Status: Abnormal   Collection Time: 06/08/15  6:13 PM  Result Value Ref Range   Glucose, Bld 150 (H) 65 - 99 mg/dL  Glucose, capillary     Status: Abnormal   Collection Time: 06/08/15  9:21 PM  Result Value Ref Range   Glucose-Capillary 43 (LL) 65 - 99 mg/dL  Glucose, capillary     Status: Abnormal   Collection Time: 06/08/15  9:34 PM  Result Value Ref Range   Glucose-Capillary 63 (L) 65 - 99 mg/dL  Glucose, capillary     Status: None   Collection Time: 06/08/15 10:07 PM  Result Value Ref Range   Glucose-Capillary 82 65 - 99 mg/dL  Glucose, random     Status: None   Collection Time: 06/09/15 12:00 AM  Result Value Ref Range   Glucose, Bld 88 65 - 99 mg/dL  Glucose, random     Status: Abnormal   Collection Time: 06/09/15  6:04 AM  Result Value Ref Range   Glucose, Bld 117 (H) 65 - 99 mg/dL  Glucose, capillary     Status: Abnormal   Collection Time:  06/09/15  6:55 AM  Result Value Ref Range   Glucose-Capillary 121 (H) 65 - 99 mg/dL  Glucose, capillary     Status: Abnormal   Collection Time: 06/09/15 11:49 AM  Result Value Ref Range   Glucose-Capillary 139 (H) 65 - 99 mg/dL  Glucose, random     Status: Abnormal   Collection Time: 06/09/15 12:05 PM  Result Value Ref Range   Glucose, Bld 100 (H) 65 - 99 mg/dL  Glucose, capillary     Status: Abnormal   Collection Time: 06/09/15  5:07 PM  Result Value Ref Range   Glucose-Capillary 140 (H) 65 - 99 mg/dL  Glucose, random     Status: Abnormal   Collection Time: 06/09/15  5:41 PM  Result Value Ref Range   Glucose, Bld 231 (H) 65 - 99 mg/dL  Glucose, capillary     Status: Abnormal   Collection Time: 06/09/15  8:38 PM  Result Value Ref Range   Glucose-Capillary 49 (L) 65 - 99 mg/dL  Glucose, capillary     Status: Abnormal   Collection Time: 06/09/15  8:58 PM  Result Value Ref Range   Glucose-Capillary 44 (LL) 65 - 99 mg/dL  Glucose, capillary     Status: Abnormal   Collection Time: 06/09/15  9:34 PM  Result Value Ref Range   Glucose-Capillary 127 (H) 65 - 99 mg/dL  Glucose, random     Status: Abnormal   Collection Time: 06/10/15 12:06 AM  Result Value Ref Range   Glucose, Bld 131 (H) 65 - 99 mg/dL  Glucose, capillary     Status: Abnormal   Collection Time: 06/10/15 12:31 AM  Result Value Ref Range   Glucose-Capillary 126 (H) 65 - 99 mg/dL  Glucose, random  Status: Abnormal   Collection Time: 06/10/15  7:09 AM  Result Value Ref Range   Glucose, Bld 148 (H) 65 - 99 mg/dL  Glucose, capillary     Status: Abnormal   Collection Time: 06/10/15  7:10 AM  Result Value Ref Range   Glucose-Capillary 149 (H) 65 - 99 mg/dL  Glucose, capillary     Status: Abnormal   Collection Time: 06/10/15 12:00 PM  Result Value Ref Range   Glucose-Capillary 282 (H) 65 - 99 mg/dL  Glucose, capillary     Status: Abnormal   Collection Time: 06/10/15  4:58 PM  Result Value Ref Range    Glucose-Capillary 126 (H) 65 - 99 mg/dL    Physical Findings: AIMS: Facial and Oral Movements Muscles of Facial Expression: None, normal Lips and Perioral Area: None, normal Jaw: None, normal Tongue: None, normal,Extremity Movements Upper (arms, wrists, hands, fingers): None, normal Lower (legs, knees, ankles, toes): None, normal, Trunk Movements Neck, shoulders, hips: None, normal, Overall Severity Severity of abnormal movements (highest score from questions above): None, normal Incapacitation due to abnormal movements: None, normal Patient's awareness of abnormal movements (rate only patient's report): No Awareness, Dental Status Current problems with teeth and/or dentures?: No Does patient usually wear dentures?: Yes  CIWA:    COWS:     Musculoskeletal: Strength & Muscle Tone: within normal limits Gait & Station: normal Patient leans: N/A  Psychiatric Specialty Exam: Review of Systems  Constitutional: Negative for fever, chills, weight loss, malaise/fatigue and diaphoresis.  HENT: Positive for hearing loss. Negative for congestion, ear discharge, ear pain, nosebleeds, sore throat and tinnitus.        She is missing her hearing aid.  Eyes: Negative for blurred vision, double vision, photophobia, pain and discharge.  Respiratory: Negative for cough, hemoptysis, sputum production, shortness of breath and wheezing.   Cardiovascular: Negative for chest pain, palpitations, orthopnea, leg swelling and PND.  Gastrointestinal: Negative for heartburn, nausea, vomiting, abdominal pain, diarrhea, constipation and blood in stool.  Genitourinary: Negative for dysuria, urgency, frequency and hematuria.  Musculoskeletal: Negative for myalgias, back pain, joint pain, falls and neck pain.  Skin: Negative for itching and rash.  Neurological: Negative for dizziness, tingling, tremors, sensory change, focal weakness, seizures, loss of consciousness, weakness and headaches.  Endo/Heme/Allergies:  Does not bruise/bleed easily.    Blood pressure 113/71, pulse 81, temperature 98.4 F (36.9 C), temperature source Oral, resp. rate 20, height 5\' 3"  (1.6 m), weight 69.854 kg (154 lb).Body mass index is 27.29 kg/(m^2).  General Appearance: Casual  Eye Contact::  Good  Speech:  Clear and Coherent and Normal Rate  Volume:  Normal  Mood:  OK  Affect:  Calm and congruent.  Thought Process:  She had a difficult time hearing  Orientation:  Other:  Oriented to person and place and not time  Thought Content:  Not clear as there is a lot of difficulty with commmunication without her hearing aid  Suicidal Thoughts:  No  Homicidal Thoughts:  No  Memory:  Immediate;   Poor Recent;   Poor Remote;   Poor  Judgement:  Impaired  Insight:  Lacking  Psychomotor Activity:  Slow gait  Concentration:  Fair  Recall:  Poor  Fund of Knowledge:Fair  Language: Fair  Akathisia:  No  Handed:  Right  AIMS (if indicated):     Assets:  Desire for Improvement Housing Physical Health  ADL's:  Intact  Cognition: Impaired,  Mild  Sleep:  Number of Hours: 6.25  74 year old female with history of schizophrenia versus bipolar disorder who has presented to our emergency department twice over the last couple of days reporting psychotic symptoms. I suspect patient is noncompliant with her medications as a means by an elevated TSH, an elevated hemoglobin A1c and current psychotic symptoms despite being prescribed with olanzapine 10 mg daily at bedtime.  Schizophrenia: Will continue Zyprexa 10 mg daily at bedtime. With her difficulty with hearing it is difficult to do a full assessment. Appears stable. We plan to contact her son for collateral information and discharge planning.  Angina:Continue Ranexa 500 mg by mouth twice a day. Patient will also be continued on nitroglycerin when necessary.  Cardiovascular disease: continue ASA 81 mg /d. Vital signs are stable.  Hypertension: Metoprolol has been held due to  low blood pressures.  Edema: Continue Lasix 20 mg a day  Hypokalemia: Continue potassium 20 mEq a day  Diabetes: Blood sugars have been fluctuating. Insulin was decreased to 30 units in the morning and 20 units with dinner because she was dropping too low. .. Continue insulin 70/30. Marland Kitchen She will also will be continued on metformin  po BID. Hemoglobin A1c is about 7. I will order supplemental insulin. Capillary blood glucose will be check 4 times a day. Hospitalist saw her today  Hypothyroidism: Back in February patient was on Synthroid 50 g a day. This medication was not listed as part of her home medicines. TSH was 10. Synthroid 50 mcg/d has been restarted.  GERD: Continue daily PPI  Vital signs will be check every 12 hours.  Diet: low sodium and carb modified  Precautions every 15 minute checks  Hospitalization and status: Continue involuntary commitment  Low B12: B12 level of 258. Will add B-12 IM x 3 days and then orally.  Labs: TSH continues to be elevated at 9, T3 and T4 are normal, HIV nonreactive, RPR nonreactive. Ammonia level was within the normal limits, vitamin B12 was 258. Urine culture was contaminated.  EKG:WNL  Plan to discharge home in the next 24-48 hours  Treatment Plan Summary: Daily contact with patient to assess and evaluate symptoms and progress in treatment and Medication management  Jimmy Footman 06/10/2015, 5:17 PM

## 2015-06-10 NOTE — Plan of Care (Signed)
Problem: Ineffective individual coping Goal: STG: Patient will remain free from self harm Outcome: Progressing No si voiced contracts for safety

## 2015-06-10 NOTE — Progress Notes (Signed)
Patient ID: Danielle Boyer, female   DOB: 12/30/1940, 74 y.o.   MRN: [id number]  CSW attempted to complete assessment. Pt was unable to participate due to being very hard of hearing. She states she lost her hearing aid. Pt gave CSW permission to contact her son.   Daisy FloroCandace L Arden Tinoco MSW, LCSWA  06/10/2015 5:20 PM

## 2015-06-10 NOTE — [facility] Group Notes (Signed)
[facility] LCSW Group Therapy  06/10/2015 4:56 PM  Type of Therapy:  Group Therapy  Participation Level:  Did Not Attend  Modes of Intervention:  Discussion, Education, Socialization and Support  Summary of Progress/Problems: Emotional Regulation: Patients will identify both negative and positive emotions. They will discuss emotions they have difficulty regulating and how they impact their lives. Patients will be asked to identify healthy coping skills to combat unhealthy reactions to negative emotions.     Danielle Boyer L Eliyohu Class MSW, LCSWA  06/10/2015, 4:56 PM  

## 2015-06-10 NOTE — [facility] Group Notes (Signed)
[facility] Group Notes:  (Nursing/MHT/Case Management/Adjunct)  Date:  06/10/2015  Time:  2:26 AM  Type of Therapy:  Group Therapy  Participation Level:  Did Not Attend    Veva Holesshley Imani Jennifr Gaeta 06/10/2015, 2:26 AM

## 2015-06-11 LAB — GLUCOSE, CAPILLARY
GLUCOSE-CAPILLARY: 138 mg/dL — AB (ref 65–99)
GLUCOSE-CAPILLARY: 146 mg/dL — AB (ref 65–99)
GLUCOSE-CAPILLARY: 105 mg/dL — AB (ref 65–99)
Glucose-Capillary: 70 mg/dL (ref 65–99)
Glucose-Capillary: 134 mg/dL — ABNORMAL HIGH (ref 65–99)
Glucose-Capillary: 122 mg/dL — ABNORMAL HIGH (ref 65–99)

## 2015-06-11 MED ORDER — ATORVASTATIN CALCIUM 40 MG PO TABS: 40.0000 mg | ORAL_TABLET | Freq: Every day | ORAL | Status: AC

## 2015-06-11 MED ORDER — ASPIRIN 81 MG PO CHEW: 81.0000 mg | CHEWABLE_TABLET | Freq: Every day | ORAL | Status: DC

## 2015-06-11 MED ORDER — INSULIN ASPART PROT & ASPART (70-30 MIX) 100 UNIT/ML ~~LOC~~ SUSP: 20.0000 [IU] | Freq: Every day | SUBCUTANEOUS | Status: DC

## 2015-06-11 MED ORDER — GUAIFENESIN-DM 100-10 MG/5ML PO SYRP
5.0000 mL | ORAL_SOLUTION | ORAL | Status: DC | PRN
  Administered 2015-06-11 – 2015-06-12 (×2): 5 mL via ORAL
  Filled 2015-06-11 (×4): qty 5

## 2015-06-11 MED ORDER — INSULIN ASPART PROT & ASPART (70-30 MIX) 100 UNIT/ML ~~LOC~~ SUSP: 10.0000 [IU] | Freq: Every day | SUBCUTANEOUS | Status: DC

## 2015-06-11 MED ORDER — LEVOTHYROXINE SODIUM 50 MCG PO TABS: 50.0000 ug | ORAL_TABLET | Freq: Every day | ORAL | Status: AC

## 2015-06-11 MED ORDER — GUAIFENESIN ER 600 MG PO TB12: 600.0000 mg | ORAL_TABLET | Freq: Two times a day (BID) | ORAL | Status: DC

## 2015-06-11 MED ORDER — CYANOCOBALAMIN 1000 MCG PO TABS: 1000.0000 ug | ORAL_TABLET | Freq: Every day | ORAL | Status: AC

## 2015-06-11 NOTE — Progress Notes (Signed)
Recreation Therapy Notes  Date: 12.06.16 Time: 3:20 pm Location: Craft Room  Group Topic: Goal Setting  Goal Area(s) Addresses:  Patient will write at least one goal. Patient will write at least one obstacle.  Behavioral Response: Did not attend  Intervention: Recovery Goal Chart  Activity: Patients were instructed to make a Recovery Goal Chart including goals, obstacles, the date they started working on their goals, and the date they achieved their goal.  Education: LRT educated patients on healthy ways they can celebrate reaching their goals.  Education Outcome: Patient did not attend group.   Clinical Observations/Feedback: Patient did not attend group.  Jacquelynn CreeGreene,Breanda Greenlaw M, LRT/CTRS 06/11/2015 4:00 PM

## 2015-06-11 NOTE — Progress Notes (Signed)
Inpatient Diabetes Program Recommendations  AACE/ADA: New Consensus Statement on Inpatient Glycemic Control (2015)  Target Ranges:  Prepandial:   less than 140 mg/dL      Peak postprandial:   less than 180 mg/dL (1-2 hours)      Critically ill patients:  140 - 180 mg/dL   Review of Glycemic Control:  Results for [name], Teylor M (MRN [id number]) as of 06/11/2015 11:22  Ref. Range 06/10/2015 07:10 06/10/2015 12:00 06/10/2015 16:58 06/10/2015 20:17 06/11/2015 06:47  Glucose-Capillary Latest Ref Range: 65-99 mg/dL 562149 (H) 130282 (H) 865126 (H) 138 (H) 146 (H)    Blood sugars improved yesterday and this morning.  She will need follow-up with PCP regarding medications however may consider decreased doses of 70/30 at discharge?  Thanks, Beryl MeagerJenny Lakeyn Dokken, RN, BC-ADM Inpatient Diabetes Coordinator Pager [phone] (8a-5p)

## 2015-06-11 NOTE — Discharge Summary (Addendum)
Physician Discharge Summary Note  Patient:  Danielle Boyer is an 74 y.o., female MRN:  [id number] DOB:  December 20, 1940 Patient phone:  [phone] (home)  Patient address:   [place] [address],  Total Time spent with patient: 30 minutes  Date of Admission:  06/06/2015 Date of Discharge: 06/12/15  Reason for Admission:  psychosis  Principal Problem: Schizophrenia [facility]) Discharge Diagnoses: Patient Active Problem List   Diagnosis Date Noted  . 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 Essential hypertension [I10] 06/07/2015  . Hearing loss [H91.90] 06/07/2015  . Hypothyroidism [E03.9] 06/07/2015  . Cardiovascular disease [I25.10] 06/07/2015  . Atrial fibrillation ([place]) [I48.91] 06/07/2015  . GERD (gastroesophageal reflux disease) [K21.9] 06/07/2015  . IBS (irritable bowel syndrome) [K58.9] 06/07/2015  . Osteoarthritis [M19.90] 06/07/2015  . Paranoid schizophrenia ([place]) [F20.0]   . Schizophrenia ([place]) [F20.9] 06/06/2015  . Diabetes ([place]) [E11.9] 03/19/2015   History of Present Illness:  74 year old woman with history of schizophrenia vs bipolar disorder presented to the ER on 11/30 via EMS. The patient was at home and apparently called 911 several times. Patient reported there was someone whom she knew was dead whose voice was telling her that he was going to killed her dog. She has no thoughts of harm her self or others but she is very concerned that these people that she can hear talking to her, one of whom she believes to have died several years ago, will cause her harm.  This patient was just seen a couple days ago in the emergency room under similar circumstances. She says that people who are calling her (speaking to her through the roof of her house) are threatening to kill her and her dog. She hasn't been sleeping well. Her mood is anxious and nervous. Patient claims that she's been compliant with her medicine but isn't really able to detail what she takes.  Denies that she's been abusing substances. Denies any specific new psychosocial trauma.   Today during assessment she was calm, friendly and cooperative. 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 Says she uses a hearing aid at home but she lost it prior to coming into the hospital. Pt denies hearing voices today. She thinks one of her medicines was making her hear things. She has a mix breed dog at home called Ringo. Patient denies having any problems with her mood, appetite, energy or concentration. Denies SI, HI or hallucinations. Per nursing notes looks like she is still thinking someone was talking-threatening her at home.   Substance abuse history: Patient does not have a significant substance abuse history and is not currently abusing any substances  Associated Signs/Symptoms: Depression Symptoms: insomnia, (Hypo) Manic Symptoms: none Anxiety Symptoms: none Psychotic Symptoms: Hallucinations: Auditory Paranoia, PTSD Symptoms: Negative   Total Time spent with patient: 1 hour  Past Psychiatric History: In Feb 2016 she was in our unit for psychosis. In 2011 she stayed in our unit for almost 3 weeks for similar symptoms. She was treated with Risperdal, Haldol, Depakote, and Zyprexa. There are no suicide attempts. No history of substance use.   Past Medical History: Past Surgical History  Procedure Laterality Date  . Cardiac catheterization  . Stent placement 1999  at [facility]  . Appendectomy  . Hysterectomy  with one ovary removed  Past Medical History  Diagnosis Date  . Diabetes mellitus without complication ([place])   . Hypertension     Past Surgical History  Procedure Laterality Date  . Abdominal hysterectomy    .  Cesarean section      x3   Family History:  Family History  Problem Relation Age of Onset  . Breast cancer Sister    Family Psychiatric History: With depression and anxiety. Her sister is on Xanax.    Social History: She is widowed. She lives by herself with a dog. She believes that the dog is the only other being that is able to hear the voices as reportedly the dog barks when the patient hears the voices. She has a very supportive family. Her younger son, Liliane Channel, who is the healthcare power of attorney, spends weekends with her. She often goes to the house of Eddie Dibbles, her older son, but she refuses to stay there for the night even though the family made arrangements for that. She also has a daughter, Rodena Piety, who has been bringing the patient to all of her appointments since 2011. She has Medicare and Medicaid. The patient all her life she has been either married or in a relationship and it has been only a couple of years when she does not have a female partner in the house. She still drives a car and visits her son's bakery in [place] almost daily.   History  Alcohol Use No    History  Drug Use No    Social History   Social History  . Marital Status: Widowed    Spouse Name: N/A  . Number of Children: N/A  . Years of Education: N/A   Social History Main Topics  . Smoking status: Never Smoker   . Smokeless tobacco: Never Used  . Alcohol Use: No  . Drug Use: No  . Sexual Activity: Not Asked   Other Topics Concern  . None   Social History Narrative   Allergies:  Allergies  Allergen Reactions  . Citalopram Other (See Comments)    Reaction: Unknown   . Penicillins Rash and Other (See Comments)    Unable to obtain enough information to answer additional questions about this medication        Hospital Course:    74 year old female with history of schizophrenia versus bipolar disorder who has presented to our emergency department twice over the last couple of days reporting psychotic symptoms. I suspect patient is noncompliant with her medications as a means by an elevated TSH, an elevated hemoglobin A1c and current  psychotic symptoms despite being prescribed with olanzapine 10 mg daily at bedtime.  Schizophrenia: Patient was reassessed started on Zyprexa 10 mg by mouth daily at bedtime. She responded very well to the medication and after 2 days on it not longer was reporting having auditory hallucinations of feelings that she was in danger. Most likely this episode was caused by noncompliance  Angina:Continue Ranexa 500 mg by mouth twice a day. Patient will also be continued on nitroglycerin when necessary.  Cardiovascular disease: continue ASA 81 mg /d. Vital signs are stable.  Hypertension: Metoprolol has been held due to low blood pressures.  Edema: Continue Lasix 20 mg a day  Hypokalemia: Continue potassium 20 mEq a day  Diabetes: Blood sugars have been fluctuating. Insulin 70/30 was decreased to 20 units in the morning and 10 units with dinner. Continue insulin . Shel also will be continued on metformin 1063m po BID. Hemoglobin A1c is about 7. I will order supplemental insulin. Capillary blood glucose will be check 4 times a day. Hospitalist saw her yesterday and today.  Hypothyroidism: Back in February patient was on Synthroid 50 g a day. This medication was  not listed as part of her home medicines. TSH was 10. Synthroid 50 mcg/d has been restarted.  GERD: Continue daily PPI  Low B12: B12 level of 258. Will add B-12 1031mg IM x 3 days and then orally.  Labs: TSH continues to be elevated at 9, T3 and T4 are normal, HIV nonreactive, RPR nonreactive. Ammonia level was within the normal limits, vitamin B12 was 258. Urine culture was contaminated.  EKG:WNL  This hospitalization was uneventful amount Danielle Boyer was at all times,, friendly pleasant and cooperative. Her poor hearing made difficult for her to participate in programming or to interact with staff or peers. On the day of the discharge she denied having any issues or concerns. She denied SI, HI or having auditory or visual  hallucinations, she denied having problems with sleep, appetite energy or concentration. She denied problems with mood. She denied having physical complaints or any side affects from medications.  There was no need for seclusion, restraints or forced medication  As far as her medical issues she was reassessed started on Synthroid as her TSH was 10, she also was evaluated by the hospitalist service as she had some capillary blood sugars in the 40s. Her insulin was changed to 07/30 20 units in the morning and 10 units in the evening.  All her prescriptions were sent electronically to her pharmacy.  I contacted the patient's sons prior to discharge and discuss issues with compliance.  Will request home health due to poor compliance.  Physical Findings: AIMS: Facial and Oral Movements Muscles of Facial Expression: None, normal Lips and Perioral Area: None, normal Jaw: None, normal Tongue: None, normal,Extremity Movements Upper (arms, wrists, hands, fingers): None, normal Lower (legs, knees, ankles, toes): None, normal, Trunk Movements Neck, shoulders, hips: None, normal, Overall Severity Severity of abnormal movements (highest score from questions above): None, normal Incapacitation due to abnormal movements: None, normal Patient's awareness of abnormal movements (rate only patient's report): No Awareness, Dental Status Current problems with teeth and/or dentures?: No Does patient usually wear dentures?: Yes  CIWA:    COWS:     Musculoskeletal: Strength & Muscle Tone: within normal limits Gait & Station: normal Patient leans: N/A  Psychiatric Specialty Exam: ROS  Blood pressure 157/69, pulse 98, temperature 98.2 F (36.8 C), temperature source Oral, resp. rate 20, height _0  (1.6 m), weight 69.854 kg (154 lb).Body mass index is 27.29 kg/(m^2).  General Appearance: Well Groomed  EEngineer, water:  Good  Speech:  Clear and Coherent  Volume:  Normal  Mood:  Euthymic  Affect:   Congruent  Thought Process:  Linear  Orientation:  Full (Time, Place, and Person)  Thought Content:  Hallucinations: None  Suicidal Thoughts:  No  Homicidal Thoughts:  No  Memory:  Immediate;   Good Recent;   Good Remote;   Good  Judgement:  Good  Insight:  Good  Psychomotor Activity:  Normal  Concentration:  Good  Recall:  [place] Knowledge:Good  Language: Good  Akathisia:  No  Handed:    AIMS (if indicated):     Assets:  Financial Resources/Insurance Housing Social Support  ADL's:  Intact  Cognition: WNL  Sleep:  Number of Hours: 8    Metabolic Disorder Labs:  Lab Results  Component Value Date   HGBA1C 7.0* 06/06/2015   No results found for: PROLACTIN Lab Results  Component Value Date   CHOL 236* 06/06/2015   TRIG 387* 06/06/2015   HDL 38* 06/06/2015   CHOLHDL 6.2  06/06/2015   VLDL 77* 06/06/2015   LDLCALC 121* 06/06/2015   Results for ELLYSIA, CHAR (MRN [id number]) as of 06/11/2015 16:12  Ref. Range 06/10/2015 18:24  Sodium Latest Ref Range: 135-145 mmol/L 132 (L)  Potassium Latest Ref Range: 3.5-5.1 mmol/L 4.3  Chloride Latest Ref Range: 101-111 mmol/L 97 (L)  CO2 Latest Ref Range: 22-32 mmol/L 25  BUN Latest Ref Range: 6-20 mg/dL 18  Creatinine Latest Ref Range: 0.44-1.00 mg/dL 0.87  Calcium Latest Ref Range: 8.9-10.3 mg/dL 9.2  EGFR (Non-African Amer.) Latest Ref Range: >60 mL/min >60  EGFR (African American) Latest Ref Range: >60 mL/min >60  Glucose Latest Ref Range: 65-99 mg/dL 179 (H)  Anion gap Latest Ref Range: 5-15  10   Results for EMILYROSE, DARRAH (MRN [id number]) as of 06/11/2015 16:12  Ref. Range 06/07/2015 16:06 06/07/2015 16:07  Ammonia Latest Ref Range: 9-35 umol/L 19   Vitamin B-12 Latest Ref Range: 180-914 pg/mL 258   Glucose Latest Ref Range: 65-99 mg/dL  177 (H)  TSH Latest Ref Range: 0.450-4.500 uIU/mL 9.720 (H)   T4, Total Latest Ref Range: 4.5-12.0 ug/dL 7.1   Free Thyroxine Index Latest Ref Range: 1.2-4.9  1.7   T3 Uptake Ratio  Latest Ref Range: 24-39 % 24   RPR Latest Ref Range: Non Reactive  Non Reactive   HIV Latest Ref Range: Non Reactive  Non Reactive    Results for OCIE, STANZIONE (MRN [id number]) as of 06/11/2015 16:12  Ref. Range 06/06/2015 15:31  Cholesterol Latest Ref Range: 0-200 mg/dL 236 (H)  Triglycerides Latest Ref Range: <150 mg/dL 387 (H)  HDL Cholesterol Latest Ref Range: >40 mg/dL 38 (L)  LDL (calc) Latest Ref Range: 0-99 mg/dL 121 (H)  VLDL Latest Ref Range: 0-40 mg/dL 77 (H)  Total CHOL/HDL Ratio Latest Units: RATIO 6.2  Hemoglobin A1C Latest Ref Range: 4.0-6.0 % 7.0 (H)  TSH Latest Ref Range: 0.350-4.500 uIU/mL 10.236 (H)    Results for EMALENE, WELTE (MRN [id number]) as of 06/11/2015 16:12  Ref. Range 06/05/2015 19:32 06/05/2015 22:52  WBC Latest Ref Range: 3.6-11.0 K/uL 9.3   RBC Latest Ref Range: 3.80-5.20 MIL/uL 3.88   Hemoglobin Latest Ref Range: 12.0-16.0 g/dL 12.2   HCT Latest Ref Range: 35.0-47.0 % 36.4   MCV Latest Ref Range: 80.0-100.0 fL 93.7   MCH Latest Ref Range: 26.0-34.0 pg 31.3   MCHC Latest Ref Range: 32.0-36.0 g/dL 33.4   RDW Latest Ref Range: 11.5-14.5 % 15.6 (H)   Platelets Latest Ref Range: 150-440 K/uL 313   Neutrophils Latest Units: % 66   Lymphocytes Latest Units: % 24   Monocytes Relative Latest Units: % 7   Eosinophil Latest Units: % 2   Basophil Latest Units: % 1   NEUT# Latest Ref Range: 1.4-6.5 K/uL 6.2   Lymphocyte # Latest Ref Range: 1.0-3.6 K/uL 2.2   Monocyte # Latest Ref Range: 0.2-0.9 K/uL 0.6   Eosinophils Absolute Latest Ref Range: 0-0.7 K/uL 0.2   Basophils Absolute Latest Ref Range: 0-0.1 K/uL 0.1   Glucose Latest Ref Range: 65-99 mg/dL 329 (H)   Appearance Latest Ref Range: CLEAR   CLEAR (A)  Bacteria, UA Latest Ref Range: NONE SEEN   NONE SEEN  Bilirubin Urine Latest Ref Range: NEGATIVE   NEGATIVE  Color, Urine Latest Ref Range: YELLOW   YELLOW (A)  Glucose Latest Ref Range: NEGATIVE mg/dL  150 (A)  Hgb urine dipstick Latest Ref Range: NEGATIVE    NEGATIVE  Hyaline Casts, UA Unknown  PRESENT  Ketones, ur Latest Ref Range: NEGATIVE mg/dL  NEGATIVE  Leukocytes, UA Latest Ref Range: NEGATIVE   1+ (A)  Mucous Unknown  PRESENT  Nitrite Latest Ref Range: NEGATIVE   NEGATIVE  pH Latest Ref Range: 5.0-8.0   5.0  Protein Latest Ref Range: NEGATIVE mg/dL  NEGATIVE  RBC / HPF Latest Ref Range: 0-5 RBC/hpf  0-5  Specific Gravity, Urine Latest Ref Range: 1.005-1.030   1.012  Squamous Epithelial / LPF Latest Ref Range: NONE SEEN   0-5 (A)  WBC, UA Latest Ref Range: 0-5 WBC/hpf  0-5  Alcohol, Ethyl (B) Latest Ref Range: <5 mg/dL 5 (H)   Amphetamines, Ur Screen Latest Ref Range: NONE DETECTED   NONE DETECTED  Barbiturates, Ur Screen Latest Ref Range: NONE DETECTED   NONE DETECTED  Benzodiazepine, Ur Scrn Latest Ref Range: NONE DETECTED   NONE DETECTED  Cocaine Metabolite,Ur [place] Latest Ref Range: NONE DETECTED   NONE DETECTED  Methadone Scn, Ur Latest Ref Range: NONE DETECTED   NONE DETECTED  MDMA (Ecstasy)Ur Screen Latest Ref Range: NONE DETECTED   NONE DETECTED  Cannabinoid 50 Ng, Ur [place] Latest Ref Range: NONE DETECTED   NONE DETECTED  Opiate, Ur Screen Latest Ref Range: NONE DETECTED   NONE DETECTED  Phencyclidine (PCP) Ur S Latest Ref Range: NONE DETECTED   NONE DETECTED  Tricyclic, Ur Screen Latest Ref Range: NONE DETECTED   NONE DETECTED        Discharge Instructions    Diet - low sodium heart healthy    Complete by:  As directed      Diet Carb Modified    Complete by:  As directed             Medication List    STOP taking these medications        haloperidol 0.5 MG tablet  Commonly known as:  HALDOL     HUMULIN 70/30 (70-30) 100 UNIT/ML injection  Generic drug:  insulin NPH-regular Human  Replaced by:  insulin aspart protamine- aspart (70-30) 100 UNIT/ML injection      TAKE these medications      Indication   aspirin 81 MG chewable tablet  Chew 1 tablet (81 mg total) by mouth daily.  Notes to Patient:  Cardiovascular  health      atorvastatin 40 MG tablet  Commonly known as:  LIPITOR  Take 1 tablet (40 mg total) by mouth daily at 6 PM.  Notes to Patient:  Cholesterol      cyanocobalamin 1000 MCG tablet  Take 1 tablet (1,000 mcg total) by mouth daily.  Notes to Patient:  Vitamin B12 deficiency      furosemide 20 MG tablet  Commonly known as:  LASIX  Take 20 mg by mouth daily.  Notes to Patient:  Edema      insulin aspart protamine- aspart (70-30) 100 UNIT/ML injection  Commonly known as:  NOVOLOG MIX 70/30  Inject 0.2 mLs (20 Units total) into the skin daily with breakfast.  Notes to Patient:  Diabetes      insulin aspart protamine- aspart (70-30) 100 UNIT/ML injection  Commonly known as:  NOVOLOG MIX 70/30  Inject 0.1 mLs (10 Units total) into the skin daily with supper.  Notes to Patient:  Diabetes      levothyroxine 50 MCG tablet  Commonly known as:  SYNTHROID, LEVOTHROID  Take 1 tablet (50 mcg total) by mouth daily before breakfast.  Notes to Patient:  Hypothyroidism  metFORMIN 1000 MG tablet  Commonly known as:  GLUCOPHAGE  Take 1,000 mg by mouth 2 (two) times daily.  Notes to Patient:  Diabetes      metoprolol 50 MG tablet  Commonly known as:  LOPRESSOR  Take 50 mg by mouth 2 (two) times daily.  Notes to Patient:  Hypertension      nitroGLYCERIN 0.4 MG SL tablet  Commonly known as:  NITROSTAT  Place 0.4 mg under the tongue every 5 (five) minutes as needed for chest pain.  Notes to Patient:  Chest pain      OLANZapine 10 MG tablet  Commonly known as:  ZYPREXA  Take 1 tablet (10 mg total) by mouth at bedtime.  Notes to Patient:  Psychosis      omeprazole 20 MG capsule  Commonly known as:  PRILOSEC  Take 20 mg by mouth 2 (two) times daily.  Notes to Patient:  GERD      potassium chloride SA 20 MEQ tablet  Commonly known as:  K-DUR,KLOR-CON  Take 20 mEq by mouth daily.  Notes to Patient:  Low potassium      ranolazine 500 MG 12 hr tablet  Commonly known as:   RANEXA  Take 500 mg by mouth 2 (two) times daily.  Notes to Patient:  Chest pain      traZODone 100 MG tablet  Commonly known as:  DESYREL  Take 100 mg by mouth at bedtime.  Notes to Patient:  Insomnia        Follow-up Information    Follow up with Dr. Doy Hutching at [facility] On 07/05/2015.   Why:  Hospital follow up appointment with Dr. Doy Hutching on Friday 07/05/15 at 10:15am.  Please arrive 15 minutes early.  Please call if you need to reschedule.   Contact information:   [address] [place], [place] [address] Phone: [phone] Fax: [phone]      Signed: Hildred Priest 06/12/2015, 12:14 PM

## 2015-06-11 NOTE — Tx Team (Addendum)
.    Interdisciplinary Treatment Plan Update (Adult)         Date: 06/11/2015   Time Reviewed: 9:30 AM   Progress in Treatment: Improving Attending groups: No Participating in groups: No  Taking medication as prescribed: Yes  Tolerating medication: Yes  Family/Significant other contact made: Yes, CSW spoke with pt's son Eddie Dibbles. Patient understands diagnosis: Yes   Discussing patient identified problems/goals with staff: Yes  Medical problems stabilized or resolved: Yes  Denies suicidal/homicidal ideation: Yes  Issues/concerns per patient self-inventory: Yes  Other:   New problem(s) identified: N/A   Discharge Plan or Barriers: Pt will likely return home and follow up with outpatient services.   Reason for Continuation of Hospitalization:   Depression   Anxiety   Medication Stabilization   Comments: N/A   Estimated length of stay: Discharge anticipated for 06/12/15    Per initial ED assessment: Patient is a 74 year old woman with history of schizophrenia vs bipolar disorder presented to the ER on 11/30 via EMS. The patient was at home and apparently called 911 several times. Patient reported there was someone whom she knew was dead whose voice was telling her that he was going to killed her dog. She has no thoughts of harm her self or others but she is very concerned that these people that she can hear talking to her, one of whom she believes to have died several years ago, will cause her harm. This patient was just seen approximately several days prior to current ED admission in the emergency room under similar circumstances. She saysthat people who are calling her (speaking to her through the roof of her house) are threatening to kill her and her dog. She hasn't been sleeping well. Her mood is anxious and nervous. Patient claims that she's been compliant with her medicine but isn't really able to detail what she takes. Denies that she's been abusing substances. Denies any specific  new psychosocial trauma. 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 Says she uses a hearing aid at home but she lost it prior to coming into the hospital. She thinks one of her medicines was making her hear things. Patient denies having any problems with her mood, appetite, energy or concentration. Denies SI, HI or hallucinations. Per nursing notes looks like she is still thinking someone was talking-threatening her at home. Patient will benefit from crisis stabilization, medication evaluation, group therapy, and psycho education in addition to case management for discharge planning. Patient and CSW reviewed pt's identified goals and treatment plan. Pt verbalized understanding and agreed to treatment plan.     Review of initial/current patient goals per problem list:  1. Goal(s): Patient will participate in aftercare plan   Met: Yes  Target date: 3-5 days post admission date   As evidenced by: Patient will participate within aftercare plan AEB aftercare provider and housing plan at discharge being identified.   06/11/15: Goal progressing.  Pt will likely return home with outpatient services.  06/12/15: Goal met.  Pt will return home and has a follow-up appointment with Dr. Doy Hutching at [facility]    2. Goal (s): Patient will exhibit decreased depressive symptoms and suicidal ideations.   Met: Adequate for discharge per MD  Target date: 3-5 days post admission date   As evidenced by: Patient will utilize self-rating of depression at 3 or below and demonstrate decreased signs of depression or be deemed stable for discharge by MD.   06/11/15:  Goal progressing.  06/12/15:  Adequate for discharge per  MD.  Pt reports baseline symptoms, denies SI.    3. Goal(s): Patient will demonstrate decreased signs and symptoms of anxiety.   Met: Adequate for discharge per MD.  Target date: 3-5 days post admission date   As evidenced by: Patient will utilize self-rating of anxiety at 3 or below and demonstrated  decreased signs of anxiety, or be deemed stable for discharge by MD   06/11/15: Goal progressing.  06/12/15: Adequate for discharge per MD.  Pt reports baseline symptoms.    4.  Goal(s): Patient will demonstrate decreased signs of psychosis  * Met: Adequate for discharge per MD. * Target date: 3-5 days post admission date  * As evidenced by: Patient will demonstrate decreased frequency of AVH or return to baseline function   06/11/15: Goal progressing.  06/12/15: Adequate for discharge per MD.  Pt reports baseline for psychosis.     Attendees:  Patient:  Family:  Physician: Dr. Jerilee Hoh, MD 06/11/2015 9:30 AM  Nursing: Bary Leriche, RN 06/11/2015 9:30 AM  Clinical Social Worker: Marylou Flesher, Barrie Lyme Drinkard LCSWA, 06/11/2015 9:30 AM  Other:Sarah Laws LCSW12/12/2014 9:30 AM  Chaplian: Kasandra Knudsen Nobles`06/11/2015 9:30 AM  Other: 06/11/2015 9:30 AM  Other: 06/11/2015 9:30 AM     Alphonse Guild Teale Goodgame, LCSWA, LCAS

## 2015-06-11 NOTE — [facility] Group Notes (Signed)
[facility] Group Notes:  (Nursing/MHT/Case Management/Adjunct)  Date:  06/11/2015  Time:  1:14 AM  Type of Therapy:  Group Therapy  Participation Level:  Did Not Attend   Summary of Progress/Problems:  Danielle Boyer 06/11/2015, 1:14 AM

## 2015-06-11 NOTE — Progress Notes (Signed)
Slept well. Blood sugar 146. No s/s hypo/hyperglycemia noted. No c/o pain/discomfort noted.

## 2015-06-11 NOTE — [facility] Suicide Risk Assessment (Addendum)
Danielle Boyer Discharge Suicide Risk Assessment   Demographic Factors:  Caucasian and Living alone  Total Time spent with patient: 30 minutes    Psychiatric Specialty Exam: Physical Exam  ROS                                                         Have you used any form of tobacco in the last 30 days? (Cigarettes, Smokeless Tobacco, Cigars, and/or Pipes): No  Has this patient used any form of tobacco in the last 30 days? (Cigarettes, Smokeless Tobacco, Cigars, and/or Pipes) No  Mental Status Per Nursing Assessment::   On Admission:     Current Mental Status by Physician: Patient denies depression, anxiety, auditory or visual hallucinations, hopelessness, helplessness. She is pleasant, calm and cooperative. Denies SI, HI or auditory or visual hallucinations  Loss Factors: Decline in physical health  Historical Factors: NA  Risk Reduction Factors:   Sense of responsibility to family and Positive social support  Continued Clinical Symptoms:  Previous Psychiatric Diagnoses and Treatments Medical Diagnoses and Treatments/Surgeries  Cognitive Features That Contribute To Risk:  None    Suicide Risk:  Minimal: No identifiable suicidal ideation.  Patients presenting with no risk factors but with morbid ruminations; may be classified as minimal risk based on the severity of the depressive symptoms  Principal Problem: Schizophrenia [facility](HCC) Discharge Diagnoses:  Patient Active Problem List   Diagnosis Date Noted  . 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 Essential hypertension [I10] 06/07/2015  . Hearing loss [H91.90] 06/07/2015  . Hypothyroidism [E03.9] 06/07/2015  . Cardiovascular disease [I25.10] 06/07/2015  . Atrial fibrillation (HCC) [I48.91] 06/07/2015  . GERD (gastroesophageal reflux disease) [K21.9] 06/07/2015  . IBS (irritable bowel syndrome) [K58.9] 06/07/2015  . Osteoarthritis [M19.90] 06/07/2015  . Paranoid schizophrenia (HCC) [F20.0]    . Schizophrenia (HCC) [F20.9] 06/06/2015  . Diabetes (HCC) [E11.9] 03/19/2015      Is patient on multiple antipsychotic therapies at discharge:  No   Has Patient had three or more failed trials of antipsychotic monotherapy by history:  No  Recommended Plan for Multiple Antipsychotic Therapies: NA    Danielle Boyer,  Danielle Boyer 06/11/2015, 4:09 PM

## 2015-06-11 NOTE — Progress Notes (Signed)
D: Patient remains hard of hearing . Appropriate ADL and personal chores . Interacting with her peers and staffs.  Appetite good . Patient denies suicidal ideations . Voice of be ready to go home . Patient denies Depression  Energy level normal . Some difficulties noted with processing information.   No auditory hallucinations  No pain concerns . Patient aware of discharge tomorrow . A: Encourage patient participation with unit programming . Instruction  Given on  Medication , verbalize understanding. R: Voice no other concerns. Staff continue to monitor

## 2015-06-11 NOTE — [facility] Group Notes (Signed)
[facility] Group Notes:  (Nursing/MHT/Case Management/Adjunct)  Date:  06/11/2015  Time:  2:32 PM  Type of Therapy:  Psychoeducational Skills  Participation Level:  Did Not Attend    Danielle Farberamela M Moses Boyer 06/11/2015, 2:32 PM

## 2015-06-11 NOTE — Progress Notes (Signed)
[facility] MD Progress Note  06/11/2015 3:57 PM Danielle Boyer  MRN:  [id number] Subjective:  Patient reports feeling well, she is states that she is no longer having hallucinations. She does not think anyone is trying to harm her or her dog. She denies side effects from her medications. She denies having any physical complaints. She denies having any side effects. She denies having problems with mood, appetite, energy or concentration.  Per hospitalist: CBG better controlled after insulin was adjusted.  Per nursing: Voice of feeling great , stated she is ready to go home . No negative behavior observed  Principal Problem: Schizophrenia ([place]) Diagnosis:   Patient Active Problem List   Diagnosis Date Noted  . 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 Essential hypertension [I10] 06/07/2015  . Hearing loss [H91.90] 06/07/2015  . Hypothyroidism [E03.9] 06/07/2015  . Cardiovascular disease [I25.10] 06/07/2015  . Atrial fibrillation ([place]) [I48.91] 06/07/2015  . GERD (gastroesophageal reflux disease) [K21.9] 06/07/2015  . IBS (irritable bowel syndrome) [K58.9] 06/07/2015  . Osteoarthritis [M19.90] 06/07/2015  . Paranoid schizophrenia ([place]) [F20.0]   . Schizophrenia ([place]) [F20.9] 06/06/2015  . Diabetes ([place]) [E11.9] 03/19/2015   Total Time spent with patient: 20 minutes  Past Psychiatric History: In Feb 2016 she was in our unit for psychosis. In 2011 she stayed in our unit for almost 3 weeks for similar symptoms. She was treated with Risperdal, Haldol, Depakote, and Zyprexa. There are no suicide attempts. No history of substance use.   Past Medical History: Past Surgical History  Procedure Laterality Date  . Cardiac catheterization  . Stent placement 1999  at [facility]  . Appendectomy  . Hysterectomy  with one ovary removed  Past Medical History  Diagnosis Date  . Diabetes mellitus without complication ([place])   . Hypertension     Past Surgical History   Procedure Laterality Date  . Abdominal hysterectomy    . Cesarean section      x3   Family History:  Family History  Problem Relation Age of Onset  . Breast cancer Sister    Family Psychiatric History: With depression and anxiety. Her sister is on Xanax.   Social History: She is widowed. She lives by herself with a dog. She believes that the dog is the only other being that is able to hear the voices as reportedly the dog barks when the patient hears the voices. She has a very supportive family. Her younger son, Liliane Channel, who is the healthcare power of attorney, spends weekends with her. She often goes to the house of Eddie Dibbles, her older son, but she refuses to stay there for the night even though the family made arrangements for that. She also has a daughter, Rodena Piety, who has been bringing the patient to all of her appointments since 2011. She has Medicare and Medicaid. The patient all her life she has been either married or in a relationship and it has been only a couple of years when she does not have a female partner in the house. She still drives a car and visits her son's bakery in [place] almost daily.        Past Medical History:  Past Medical History  Diagnosis Date  . Diabetes mellitus without complication ([place])   . Hypertension     Past Surgical History  Procedure Laterality Date  . Abdominal hysterectomy    . Cesarean section      x3   Family History:  Family History  Problem Relation Age of  Onset  . Breast cancer Sister     Social History:  History  Alcohol Use No     History  Drug Use No    Social History   Social History  . Marital Status: Widowed    Spouse Name: N/A  . Number of Children: N/A  . Years of Education: N/A   Social History Main Topics  . Smoking status: Never Smoker   . Smokeless tobacco: Never Used  . Alcohol Use: No  . Drug Use: No  . Sexual Activity: Not Asked   Other Topics Concern  . None   Social History  Narrative        Sleep: Good  Appetite:  Good  Current Medications: Current Facility-Administered Medications  Medication Dose Route Frequency Provider Last Rate Last Dose  . acetaminophen (TYLENOL) tablet 650 mg  650 mg Oral Q6H PRN Gonzella Lex, MD      . alum & mag hydroxide-simeth (MAALOX/MYLANTA) 200-200-20 MG/5ML suspension 30 mL  30 mL Oral Q4H PRN Gonzella Lex, MD      . aspirin chewable tablet 81 mg  81 mg Oral Daily Hildred Priest, MD   81 mg at 06/11/15 0834  . atorvastatin (LIPITOR) tablet 40 mg  40 mg Oral q1800 Vaughan Basta, MD   40 mg at 06/10/15 1702  . cyanocobalamin tablet 1,000 mcg  1,000 mcg Oral Daily Chauncey Mann, MD   1,000 mcg at 06/11/15 [phone]  . furosemide (LASIX) tablet 20 mg  20 mg Oral Daily Gonzella Lex, MD   20 mg at 06/11/15 2505  . insulin aspart (novoLOG) injection 0-5 Units  0-5 Units Subcutaneous QHS Hildred Priest, MD   0 Units at 06/07/15 2129  . insulin aspart (novoLOG) injection 0-9 Units  0-9 Units Subcutaneous TID WC Hildred Priest, MD   1 Units at 06/11/15 1206  . insulin aspart protamine- aspart (NOVOLOG MIX 70/30) injection 10 Units  10 Units Subcutaneous Q supper Vaughan Basta, MD   10 Units at 06/10/15 1701  . insulin aspart protamine- aspart (NOVOLOG MIX 70/30) injection 20 Units  20 Units Subcutaneous Q breakfast Vaughan Basta, MD   20 Units at 06/11/15 0827  . levothyroxine (SYNTHROID, LEVOTHROID) tablet 50 mcg  50 mcg Oral QAC breakfast Hildred Priest, MD   50 mcg at 06/11/15 0834  . magnesium hydroxide (MILK OF MAGNESIA) suspension 30 mL  30 mL Oral Daily PRN Gonzella Lex, MD      . metFORMIN (GLUCOPHAGE) tablet 500 mg  500 mg Oral BID WC Vaughan Basta, MD   500 mg at 06/11/15 0834  . metoprolol tartrate (LOPRESSOR) tablet 50 mg  50 mg Oral BID Gonzella Lex, MD   50 mg at 06/11/15 0831  . nitroGLYCERIN (NITROSTAT) SL tablet 0.4 mg  0.4 mg Sublingual Q5  min PRN Hildred Priest, MD      . OLANZapine (ZYPREXA) tablet 10 mg  10 mg Oral QHS Gonzella Lex, MD   10 mg at 06/10/15 2103  . pantoprazole (PROTONIX) EC tablet 40 mg  40 mg Oral Daily Gonzella Lex, MD   40 mg at 06/11/15 3976  . potassium chloride SA (K-DUR,KLOR-CON) CR tablet 20 mEq  20 mEq Oral Daily Gonzella Lex, MD   20 mEq at 06/11/15 7341  . ranolazine (RANEXA) 12 hr tablet 500 mg  500 mg Oral BID Gonzella Lex, MD   500 mg at 06/11/15 9379  . traZODone (DESYREL) tablet 100 mg  100 mg Oral QHS Gonzella Lex, MD   100 mg at 06/10/15 2103    Lab Results:  Results for orders placed or performed during the hospital encounter of 06/06/15 (from the past 48 hour(s))  Glucose, capillary     Status: Abnormal   Collection Time: 06/09/15  5:07 PM  Result Value Ref Range   Glucose-Capillary 140 (H) 65 - 99 mg/dL  Glucose, random     Status: Abnormal   Collection Time: 06/09/15  5:41 PM  Result Value Ref Range   Glucose, Bld 231 (H) 65 - 99 mg/dL  Glucose, capillary     Status: Abnormal   Collection Time: 06/09/15  8:38 PM  Result Value Ref Range   Glucose-Capillary 49 (L) 65 - 99 mg/dL  Glucose, capillary     Status: Abnormal   Collection Time: 06/09/15  8:58 PM  Result Value Ref Range   Glucose-Capillary 44 (LL) 65 - 99 mg/dL  Glucose, capillary     Status: Abnormal   Collection Time: 06/09/15  9:34 PM  Result Value Ref Range   Glucose-Capillary 127 (H) 65 - 99 mg/dL  Glucose, random     Status: Abnormal   Collection Time: 06/10/15 12:06 AM  Result Value Ref Range   Glucose, Bld 131 (H) 65 - 99 mg/dL  Glucose, capillary     Status: Abnormal   Collection Time: 06/10/15 12:31 AM  Result Value Ref Range   Glucose-Capillary 126 (H) 65 - 99 mg/dL  Glucose, random     Status: Abnormal   Collection Time: 06/10/15  7:09 AM  Result Value Ref Range   Glucose, Bld 148 (H) 65 - 99 mg/dL  Glucose, capillary     Status: Abnormal   Collection Time: 06/10/15  7:10 AM   Result Value Ref Range   Glucose-Capillary 149 (H) 65 - 99 mg/dL  Glucose, capillary     Status: Abnormal   Collection Time: 06/10/15 12:00 PM  Result Value Ref Range   Glucose-Capillary 282 (H) 65 - 99 mg/dL  Glucose, capillary     Status: Abnormal   Collection Time: 06/10/15  4:58 PM  Result Value Ref Range   Glucose-Capillary 126 (H) 65 - 99 mg/dL  Basic metabolic panel     Status: Abnormal   Collection Time: 06/10/15  6:24 PM  Result Value Ref Range   Sodium 132 (L) 135 - 145 mmol/L   Potassium 4.3 3.5 - 5.1 mmol/L   Chloride 97 (L) 101 - 111 mmol/L   CO2 25 22 - 32 mmol/L   Glucose, Bld 179 (H) 65 - 99 mg/dL   BUN 18 6 - 20 mg/dL   Creatinine, Ser 0.87 0.44 - 1.00 mg/dL   Calcium 9.2 8.9 - 10.3 mg/dL   GFR calc non Af Amer >60 >60 mL/min   GFR calc Af Amer >60 >60 mL/min    Comment: (NOTE) The eGFR has been calculated using the CKD EPI equation. This calculation has not been validated in all clinical situations. eGFR's persistently <60 mL/min signify possible Chronic Kidney Disease.    Anion gap 10 5 - 15  Glucose, capillary     Status: Abnormal   Collection Time: 06/10/15  8:17 PM  Result Value Ref Range   Glucose-Capillary 138 (H) 65 - 99 mg/dL   Comment 1 Notify RN    Comment 2 Document in Chart   Glucose, capillary     Status: Abnormal   Collection Time: 06/11/15  6:47 AM  Result Value  Ref Range   Glucose-Capillary 146 (H) 65 - 99 mg/dL  Glucose, capillary     Status: Abnormal   Collection Time: 06/11/15 12:04 PM  Result Value Ref Range   Glucose-Capillary 134 (H) 65 - 99 mg/dL   Comment 1 Notify RN     Physical Findings: AIMS: Facial and Oral Movements Muscles of Facial Expression: None, normal Lips and Perioral Area: None, normal Jaw: None, normal Tongue: None, normal,Extremity Movements Upper (arms, wrists, hands, fingers): None, normal Lower (legs, knees, ankles, toes): None, normal, Trunk Movements Neck, shoulders, hips: None, normal, Overall  Severity Severity of abnormal movements (highest score from questions above): None, normal Incapacitation due to abnormal movements: None, normal Patient's awareness of abnormal movements (rate only patient's report): No Awareness, Dental Status Current problems with teeth and/or dentures?: No Does patient usually wear dentures?: Yes  CIWA:    COWS:     Musculoskeletal: Strength & Muscle Tone: within normal limits Gait & Station: normal Patient leans: N/A  Psychiatric Specialty Exam: Review of Systems  Constitutional: Negative for fever, chills, weight loss, malaise/fatigue and diaphoresis.  HENT: Positive for hearing loss. Negative for congestion, ear discharge, ear pain, nosebleeds, sore throat and tinnitus.        She is missing her hearing aid.  Eyes: Negative for blurred vision, double vision, photophobia, pain and discharge.  Respiratory: Negative for cough, hemoptysis, sputum production, shortness of breath and wheezing.   Cardiovascular: Negative for chest pain, palpitations, orthopnea, leg swelling and PND.  Gastrointestinal: Negative for heartburn, nausea, vomiting, abdominal pain, diarrhea, constipation and blood in stool.  Genitourinary: Negative for dysuria, urgency, frequency and hematuria.  Musculoskeletal: Negative for myalgias, back pain, joint pain, falls and neck pain.  Skin: Negative for itching and rash.  Neurological: Negative for dizziness, tingling, tremors, sensory change, focal weakness, seizures, loss of consciousness, weakness and headaches.  Endo/Heme/Allergies: Does not bruise/bleed easily.    Blood pressure 130/74, pulse 80, temperature 98.6 F (37 C), temperature source Oral, resp. rate 20, height $RemoveBe'5\' 3"'pSoXiluCg$  (1.6 m), weight 69.854 kg (154 lb).Body mass index is 27.29 kg/(m^2).  General Appearance: Casual  Eye Contact::  Good  Speech:  Clear and Coherent and Normal Rate  Volume:  Normal  Mood:  OK  Affect:  Calm and congruent.  Thought Process:  She had  a difficult time hearing  Orientation:  Other:  Oriented to person and place and not time  Thought Content:  Not clear as there is a lot of difficulty with commmunication without her hearing aid  Suicidal Thoughts:  No  Homicidal Thoughts:  No  Memory:  Immediate;   Poor Recent;   Poor Remote;   Poor  Judgement:  Impaired  Insight:  Lacking  Psychomotor Activity:  Slow gait  Concentration:  Fair  Recall:  Poor  Fund of Knowledge:Fair  Language: Fair  Akathisia:  No  Handed:  Right  AIMS (if indicated):     Assets:  Desire for Improvement Housing Physical Health  ADL's:  Intact  Cognition: Impaired,  Mild  Sleep:  Number of Hours: 8   Treatment Plan Summary: Daily contact with patient to assess and evaluate symptoms and progress in treatment and Medication management  74 year old female with history of schizophrenia versus bipolar disorder who has presented to our emergency department twice over the last couple of days reporting psychotic symptoms. I suspect patient is noncompliant with her medications as a means by an elevated TSH, an elevated hemoglobin A1c and current psychotic symptoms despite  being prescribed with olanzapine 10 mg daily at bedtime.  Schizophrenia: Will continue Zyprexa 10 mg daily at bedtime. With her difficulty with hearing it is difficult to do a full assessment. Appears improved will discharge tomorrow.  Angina:Continue Ranexa 500 mg by mouth twice a day. Patient will also be continued on nitroglycerin when necessary.  Cardiovascular disease: continue ASA 81 mg /d. Vital signs are stable.  Hypertension: Metoprolol has been held due to low blood pressures.  Edema: Continue Lasix 20 mg a day  Hypokalemia: Continue potassium 20 mEq a day  Diabetes: Blood sugars have been fluctuating. Insulin 70/30 was decreased to 20 units in the morning and 10 units with dinner. Continue insulin . Shel also will be continued on metformin 1074m po BID. Hemoglobin A1c  is about 7. I will order supplemental insulin. Capillary blood glucose will be check 4 times a day. Hospitalist saw her yesterday and today.  Hypothyroidism: Back in February patient was on Synthroid 50 g a day. This medication was not listed as part of her home medicines. TSH was 10. Synthroid 50 mcg/d has been restarted.  GERD: Continue daily PPI  Vital signs will be check every 12 hours.  Diet: low sodium and carb modified  Precautions every 15 minute checks  Hospitalization and status: Continue involuntary commitment  Low B12: B12 level of 258. Will add B-12 10049m IM x 3 days and then orally.  Labs: TSH continues to be elevated at 9, T3 and T4 are normal, HIV nonreactive, RPR nonreactive. Ammonia level was within the normal limits, vitamin B12 was 258. Urine culture was contaminated.  EKG:WNL  Plan to discharge home in the next 24 hours   HeHildred Priest2/12/2014, 3:57 PM

## 2015-06-11 NOTE — Plan of Care (Signed)
Problem: Ineffective individual coping Goal: LTG: Patient will report a decrease in negative feelings Outcome: Progressing Voice of feeling great , stated she is ready to go home . No negative behavior observed

## 2015-06-11 NOTE — Progress Notes (Signed)
[facility] - [place] at [facility]                                                                                                                                                                                            Patient Demographics   Danielle Boyer, is a 74 y.o. female, DOB - 1940/10/27, WUJ:[id number]  Admit date - 06/06/2015   Admitting Physician No admitting provider for patient encounter.  Outpatient Primary MD for the patient is SPARKS,JEFFREY D, MD   LOS - 5  Subjective: Feels okay denies any complaints blood sugar now improved no further hypoglycemia     Review of Systems:   CONSTITUTIONAL: No documented fever. No fatigue, weakness. No weight gain, no weight loss.  EYES: No blurry or double vision.  ENT: No tinnitus. No postnasal drip. No redness of the oropharynx.  RESPIRATORY: No cough, no wheeze, no hemoptysis. No dyspnea.  CARDIOVASCULAR: No chest pain. No orthopnea. No palpitations. No syncope.  GASTROINTESTINAL: No nausea, no vomiting or diarrhea. No abdominal pain. No melena or hematochezia.  GENITOURINARY: No dysuria or hematuria.  ENDOCRINE: No polyuria or nocturia. No heat or cold intolerance.  HEMATOLOGY: No anemia. No bruising. No bleeding.  INTEGUMENTARY: No rashes. No lesions.  MUSCULOSKELETAL: No arthritis. No swelling. No gout.  NEUROLOGIC: No numbness, tingling, or ataxia. No seizure-type activity.  PSYCHIATRIC: No anxiety. No insomnia. No ADD.    Vitals:   Filed Vitals:   06/09/15 2053 06/10/15 0718 06/10/15 2035 06/11/15 0700  BP: 137/77 113/71 115/73 130/74  Pulse: 82 81 86 80  Temp: 98.2 F (36.8 C) 98.4 F (36.9 C) 97.8 F (36.6 C) 98.6 F (37 C)  TempSrc: Oral Oral Oral Oral  Resp: Height:      Weight:        Wt Readings from Last 3 Encounters:  06/06/15 69.854 kg (154 lb)  06/03/15 73.483 kg (162 lb)  03/18/15 70.761 kg (156 lb)     Intake/Output Summary (Last 24 hours) at 06/11/15  1531 Last data filed at 06/11/15 1310  Gross per 24 hour  Intake   1200 ml  Output      0 ml  Net   1200 ml    Physical Exam:   GENERAL: Pleasant-appearing in no apparent distress.  HEAD, EYES, EARS, NOSE AND THROAT: Atraumatic, normocephalic. Extraocular muscles are intact. Pupils equal and reactive to light. Sclerae anicteric. No conjunctival injection. No oro-pharyngeal erythema.  NECK: Supple. There is no jugular venous distention. No bruits, no lymphadenopathy, no thyromegaly.  HEART: Regular rate and rhythm,. No murmurs, no rubs, no  clicks.  LUNGS: Clear to auscultation bilaterally. No rales or rhonchi. No wheezes.  ABDOMEN: Soft, flat, nontender, nondistended. Has good bowel sounds. No hepatosplenomegaly appreciated.  EXTREMITIES: No evidence of any cyanosis, clubbing, or peripheral edema.  +2 pedal and radial pulses bilaterally.  NEUROLOGIC: The patient is alert, awake, and oriented x3 with no focal motor or sensory deficits appreciated bilaterally.  SKIN: Moist and warm with no rashes appreciated.  Psych: Not anxious, depressed LN: No inguinal LN enlargement    Antibiotics   Anti-infectives    None      Medications   Scheduled Meds: . aspirin  81 mg Oral Daily  . atorvastatin  40 mg Oral q1800  . vitamin B-12  1,000 mcg Oral Daily  . furosemide  20 mg Oral Daily  . insulin aspart  0-5 Units Subcutaneous QHS  . insulin aspart  0-9 Units Subcutaneous TID WC  . insulin aspart protamine- aspart  10 Units Subcutaneous Q supper  . insulin aspart protamine- aspart  20 Units Subcutaneous Q breakfast  . levothyroxine  50 mcg Oral QAC breakfast  . metFORMIN  500 mg Oral BID WC  . metoprolol  50 mg Oral BID  . OLANZapine  10 mg Oral QHS  . pantoprazole  40 mg Oral Daily  . potassium chloride SA  20 mEq Oral Daily  . ranolazine  500 mg Oral BID  . traZODone  100 mg Oral QHS   Continuous Infusions:  PRN Meds:.acetaminophen, alum & mag hydroxide-simeth, magnesium  hydroxide, nitroGLYCERIN   Data Review:   Micro Results Recent Results (from the past 240 hour(s))  Urine culture     Status: None   Collection Time: 06/04/15 12:21 AM  Result Value Ref Range Status   Specimen Description URINE, RANDOM  Final   Special Requests NONE  Final   Culture   Final    50,000 COLONIES/mL ESCHERICHIA COLI 80,000 COLONIES/ml VIRIDANS STREPTOCOCCUS    Report Status 06/08/2015 FINAL  Final   Organism ID, Bacteria ESCHERICHIA COLI  Final      Susceptibility   Escherichia coli - MIC*    AMPICILLIN <=2 SENSITIVE Sensitive     CEFTAZIDIME <=1 SENSITIVE Sensitive     CEFAZOLIN <=4 SENSITIVE Sensitive     CEFTRIAXONE <=1 SENSITIVE Sensitive     CIPROFLOXACIN <=0.25 SENSITIVE Sensitive     GENTAMICIN <=1 SENSITIVE Sensitive     IMIPENEM <=0.25 SENSITIVE Sensitive     TRIMETH/SULFA <=20 SENSITIVE Sensitive     NITROFURANTOIN Value in next row Sensitive      SENSITIVE<=16    PIP/TAZO Value in next row Sensitive      SENSITIVE<=4    LEVOFLOXACIN Value in next row Sensitive      SENSITIVE<=0.12    * 50,000 COLONIES/mL ESCHERICHIA COLI  Urine culture     Status: None   Collection Time: 06/07/15  4:06 PM  Result Value Ref Range Status   Specimen Description URINE, CLEAN CATCH  Final   Special Requests Normal  Final   Culture MULTIPLE SPECIES PRESENT, SUGGEST RECOLLECTION  Final   Report Status 06/09/2015 FINAL  Final    Radiology Reports Dg Chest 2 View  06/10/2015  CLINICAL DATA:  Productive cough EXAM: CHEST  2 VIEW COMPARISON:  03/18/2015 FINDINGS: Cardiomediastinal silhouette is stable. No acute infiltrate or pleural effusion. No pulmonary edema. Mild thoracic spine osteopenia. IMPRESSION: No active cardiopulmonary disease. Electronically Signed   By: Natasha Mead M.D.   On: 06/10/2015 12:10   Mm Digital  Screening Bilateral  05/17/2015  CLINICAL DATA:  Screening. EXAM: DIGITAL SCREENING BILATERAL MAMMOGRAM WITH CAD COMPARISON:  Previous exam(s). ACR Breast  Density Category b: There are scattered areas of fibroglandular density. FINDINGS: There are no findings suspicious for malignancy. Images were processed with CAD. IMPRESSION: No mammographic evidence of malignancy. A result letter of this screening mammogram will be mailed directly to the patient. RECOMMENDATION: Screening mammogram in one year. (Code:SM-B-01Y) BI-RADS CATEGORY  1: Negative. Electronically Signed   By: Bary Richard M.D.   On: 05/17/2015 13:28     CBC  Recent Labs Lab 06/05/15 1932  WBC 9.3  HGB 12.2  HCT 36.4  PLT 313  MCV 93.7  MCH 31.3  MCHC 33.4  RDW 15.6*  LYMPHSABS 2.2  MONOABS 0.6  EOSABS 0.2  BASOSABS 0.1    Chemistries   Recent Labs Lab 06/05/15 1932  06/09/15 1205 06/09/15 1741 06/10/15 0006 06/10/15 0709 06/10/15 1824  NA 136  --   --   --   --   --  132*  K 3.9  --   --   --   --   --  4.3  CL 99*  --   --   --   --   --  97*  CO2 22  --   --   --   --   --  25  GLUCOSE 329*  < > 100* 231* 131* 148* 179*  BUN 19  --   --   --   --   --  18  CREATININE 0.93  --   --   --   --   --  0.87  CALCIUM 9.3  --   --   --   --   --  9.2  AST 23  --   --   --   --   --   --   ALT 12*  --   --   --   --   --   --   ALKPHOS 59  --   --   --   --   --   --   BILITOT 0.6  --   --   --   --   --   --   < > = values in this interval not displayed. ------------------------------------------------------------------------------------------------------------------ estimated creatinine clearance is 51.6 mL/min (by C-G formula based on Cr of 0.87). ------------------------------------------------------------------------------------------------------------------ No results for input(s): HGBA1C in the last 72 hours. ------------------------------------------------------------------------------------------------------------------ No results for input(s): CHOL, HDL, LDLCALC, TRIG, CHOLHDL, LDLDIRECT in the last 72  hours. ------------------------------------------------------------------------------------------------------------------ No results for input(s): TSH, T4TOTAL, T3FREE, THYROIDAB in the last 72 hours.  Invalid input(s): FREET3 ------------------------------------------------------------------------------------------------------------------ No results for input(s): VITAMINB12, FOLATE, FERRITIN, TIBC, IRON, RETICCTPCT in the last 72 hours.  Coagulation profile No results for input(s): INR, PROTIME in the last 168 hours.  No results for input(s): DDIMER in the last 72 hours.  Cardiac Enzymes No results for input(s): CKMB, TROPONINI, MYOGLOBIN in the last 168 hours.  Invalid input(s): CK ------------------------------------------------------------------------------------------------------------------ Invalid input(s): POCBNP    Assessment & Plan   *Diabetes and now hypoglycemia Blood sugar better after adjusting her insulin dose. Patient died at home may have played a role. Continue current regimen  * Coronary artery disease Continue aspirin  * Hypertension Blood pressure stable continue Lopressor  * Hyperlipidemia LDL is more than 100, started on atorvastatin, as she had a coronary artery disease.  * Hypothyroidism Continue Synthroid and repeat TSH as outpatient  All the records are reviewed and case discussed with ED provider. Management plans discussed with the patient, family and they are in agreement.      Code Status Orders        Start     Ordered   06/06/15 1345  Full code   Continuous     06/06/15 1346              DVT Prophylaxis ambulatory  Lab Results  Component Value Date   PLT 313 06/05/2015     Time Spent in minutes  40 minutes Auburn BilberryPATEL, Tajae Maiolo M.D on 06/11/2015 at 3:31 PM  Between 7am to 6pm - Pager - [phone]  After 6pm go to [email or web address] - password [email or web address] [facility]  [facility]   Office  [phone]

## 2015-06-11 NOTE — [facility] Group Notes (Signed)
[facility]  06/11/2015 4:53 PM  Type of Therapy:  Group Therapy  Participation Level:  Did Not Attend   Lulu Ridingngle, Birdie Beveridge T, MSW, LCSWA 06/11/2015, 4:53 PM

## 2015-06-11 NOTE — BHH Counselor (Signed)
Adult Comprehensive Assessment  Patient ID: Danielle Boyer, female   DOB: [birth year], 74 y.o.   MRN: [id number]  Information Source: Information source: Patient  Current Stressors:  Educational / Learning stressors: N/A Employment / Job issues: N/A Family Relationships: N/A Surveyor, quantity / Lack of resources (include bankruptcy): N/A Housing / Lack of housing: N/A Physical health (include injuries & life threatening diseases): Hard of hearing Social relationships: N/A Substance abuse: N/A Bereavement / Loss: N/A  Living/Environment/Situation:  Living Arrangements: Alone Living conditions (as described by patient or guardian): Lives alone and her son lives with her two days a week How long has patient lived in current situation?: 29 years What is atmosphere in current home: Comfortable  Family History:  Marital status: Single Does patient have children?: Yes How many children?: 3 How is patient's relationship with their children?: Good relationship  Childhood History:  By whom was/is the patient raised?: Both parents Additional childhood history information: Great childhood Description of patient's relationship with caregiver when they were a child: Great relationship Patient's description of current relationship with people who raised him/her: Parents deceased Does patient have siblings?: Yes Number of Siblings: 75 Description of patient's current relationship with siblings: Two sisters, doesn't talk to them much. Did patient suffer any verbal/emotional/physical/sexual abuse as a child?: No Did patient suffer from severe childhood neglect?: No Has patient ever been sexually abused/assaulted/raped as an adolescent or adult?: No Was the patient ever a victim of a crime or a disaster?: No Witnessed domestic violence?: No Has patient been effected by domestic violence as an adult?: No  Education:  Highest grade of school patient has completed: 12th grade Currently a student?:  No Learning disability?: No  Employment/Work Situation:   Employment situation: Unemployed What is the longest time patient has a held a job?:  Ten years Where was the patient employed at that time?: [employer] Has patient ever been in the [employer]?: Yes (Describe in comment) Has patient ever served in combat?: No  Financial Resources:   Surveyor, quantity resources: Insurance claims handler  Alcohol/Substance Abuse:   What has been your use of drugs/alcohol within the last 12 months?: Denies Alcohol/Substance Abuse Treatment Hx: Denies past history Has alcohol/substance abuse ever caused legal problems?: No  Social Support System:   Conservation officer, nature Support System: Good Describe Community Support System: Children take good care of her. Type of faith/religion: [employer] Kn[employer] How does patient's faith help to cope with current illness?: Prayer  Leisure/Recreation:   Leisure and Hobbies: Watching television and hanging around  Strengths/Needs:   What things does the patient do well?: Pt states she doesn't know.  Pt states he is too old to have fun In what areas does patient struggle / problems for patient: Relationship with sisters  Discharge Plan:   Does patient have access to transportation?: Yes Will patient be returning to same living situation after discharge?: Yes Currently receiving community mental health services: No If no, would patient like referral for services when discharged?: No Does patient have financial barriers related to discharge medications?: No  Summary/Recommendations:     Patient is a 74 year old woman presented to the ER on 11/30 via EMS. Pt reported she has experienced auditory hallucinations, hearing people outside of her home.  She has no thoughts of harm her self or others but she is very concerned that these people that she can hear talking outside of her home, will cause her and her dog harm. This patient was just seen approximately several days prior to current ED admission  in the emergency room under similar circumstances. She hasn't been sleeping well. Patient claims that she's been compliant with her medicine but isn't really able to detail what she takes. Denies that she's been abusing substances. Denies any specific new psychosocial trauma. 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 Says she uses a hearing aid at home but she lost it prior to coming into the hospital. She thinks one of her medicines was making her hear things. Patient denies having any problems with her mood, appetite, energy or concentration. Denies SI, HI or hallucinations.  Patient will benefit from crisis stabilization, medication evaluation, group therapy, and psycho education in addition to case management for discharge planning. Patient and CSW reviewed pt's identified goals and treatment plan. Pt verbalized understanding and agreed to treatment plan.  [place]  Pt lists stressors as her inability to hear well.  Pt lists supports in the community as her children, especially her son who lives with her two days a week.       Dorothe PeaJonathan F Louie Flenner, LCSWA, LCAS   06/11/2015

## 2015-06-12 LAB — GLUCOSE, CAPILLARY
GLUCOSE-CAPILLARY: 202 mg/dL — AB (ref 65–99)
Glucose-Capillary: 195 mg/dL — ABNORMAL HIGH (ref 65–99)

## 2015-06-12 MED ORDER — INSULIN ASPART PROT & ASPART (70-30 MIX) 100 UNIT/ML ~~LOC~~ SUSP: 22.0000 [IU] | Freq: Every day | SUBCUTANEOUS | Status: DC

## 2015-06-12 MED ORDER — INSULIN ASPART PROT & ASPART (70-30 MIX) 100 UNIT/ML ~~LOC~~ SUSP: 12.0000 [IU] | Freq: Every day | SUBCUTANEOUS | Status: DC

## 2015-06-12 NOTE — Progress Notes (Signed)
[facility] referred to [facility] as per MD order. Jason at [facility] advised and will f/u with family. PCP is Dr. Judithann SheenSparks. Address and Phone Number verfied.

## 2015-06-12 NOTE — Progress Notes (Signed)
[facility] [place] at [facility]                                                                                                                                                                                            Patient Demographics   Danielle Boyer, is a 74 y.o. female, DOB - 22-Jan-1941, WGN:[id number]  Admit date - 06/06/2015   Admitting Physician No admitting provider for patient encounter.  Outpatient Primary MD for the patient is SPARKS,JEFFREY D, MD   LOS - 6  Subjective: bg overall stable, anxious to go home   Review of Systems:   CONSTITUTIONAL: No documented fever. No fatigue, weakness. No weight gain, no weight loss.  EYES: No blurry or double vision.  ENT: No tinnitus. No postnasal drip. No redness of the oropharynx.  RESPIRATORY: No cough, no wheeze, no hemoptysis. No dyspnea.  CARDIOVASCULAR: No chest pain. No orthopnea. No palpitations. No syncope.  GASTROINTESTINAL: No nausea, no vomiting or diarrhea. No abdominal pain. No melena or hematochezia.  GENITOURINARY: No dysuria or hematuria.  ENDOCRINE: No polyuria or nocturia. No heat or cold intolerance.  HEMATOLOGY: No anemia. No bruising. No bleeding.  INTEGUMENTARY: No rashes. No lesions.  MUSCULOSKELETAL: No arthritis. No swelling. No gout.  NEUROLOGIC: No numbness, tingling, or ataxia. No seizure-type activity.  PSYCHIATRIC: No anxiety. No insomnia. No ADD.    Vitals:   Filed Vitals:   06/11/15 0700 06/11/15 2111 06/11/15 2132 06/12/15 0709  BP: 130/74 142/53 131/55 157/69  Pulse: 80 89 92 98  Temp: 98.6 F (37 C)   98.2 F (36.8 C)  TempSrc: Oral   Oral  Resp:  20  20  Height:      Weight:        Wt Readings from Last 3 Encounters:  06/06/15 69.854 kg (154 lb)  06/03/15 73.483 kg (162 lb)  03/18/15 70.761 kg (156 lb)     Intake/Output Summary (Last 24 hours) at 06/12/15 1412 Last data filed at 06/12/15 1313  Gross per 24 hour  Intake   1800 ml  Output      0 ml   Net   1800 ml    Physical Exam:   GENERAL: Pleasant-appearing in no apparent distress.  HEAD, EYES, EARS, NOSE AND THROAT: Atraumatic, normocephalic. Extraocular muscles are intact. Pupils equal and reactive to light. Sclerae anicteric. No conjunctival injection. No oro-pharyngeal erythema.  NECK: Supple. There is no jugular venous distention. No bruits, no lymphadenopathy, no thyromegaly.  HEART: Regular rate and rhythm,. No murmurs, no rubs, no clicks.  LUNGS: Clear to auscultation bilaterally. No rales or rhonchi. No wheezes.  ABDOMEN: Soft, flat, nontender, nondistended. Has good bowel sounds. No hepatosplenomegaly appreciated.  EXTREMITIES: No evidence of any cyanosis, clubbing, or peripheral edema.  +2 pedal and radial pulses bilaterally.  NEUROLOGIC: The patient is alert, awake, and oriented x3 with no focal motor or sensory deficits appreciated bilaterally.  SKIN: Moist and warm with no rashes appreciated.  Psych: Not anxious, depressed LN: No inguinal LN enlargement    Antibiotics   Anti-infectives    None      Medications   Scheduled Meds: . aspirin  81 mg Oral Daily  . atorvastatin  40 mg Oral q1800  . vitamin B-12  1,000 mcg Oral Daily  . furosemide  20 mg Oral Daily  . insulin aspart  0-5 Units Subcutaneous QHS  . insulin aspart  0-9 Units Subcutaneous TID WC  . insulin aspart protamine- aspart  12 Units Subcutaneous Q supper  . [START ON 06/13/2015] insulin aspart protamine- aspart  22 Units Subcutaneous Q breakfast  . levothyroxine  50 mcg Oral QAC breakfast  . metFORMIN  500 mg Oral BID WC  . metoprolol  50 mg Oral BID  . OLANZapine  10 mg Oral QHS  . pantoprazole  40 mg Oral Daily  . potassium chloride SA  20 mEq Oral Daily  . ranolazine  500 mg Oral BID  . traZODone  100 mg Oral QHS   Continuous Infusions:  PRN Meds:.acetaminophen, alum & mag hydroxide-simeth, guaiFENesin-dextromethorphan, magnesium hydroxide, nitroGLYCERIN   Data Review:   Micro  Results Recent Results (from the past 240 hour(s))  Urine culture     Status: None   Collection Time: 06/04/15 12:21 AM  Result Value Ref Range Status   Specimen Description URINE, RANDOM  Final   Special Requests NONE  Final   Culture   Final    50,000 COLONIES/mL ESCHERICHIA COLI 80,000 COLONIES/ml VIRIDANS STREPTOCOCCUS    Report Status 06/08/2015 FINAL  Final   Organism ID, Bacteria ESCHERICHIA COLI  Final      Susceptibility   Escherichia coli - MIC*    AMPICILLIN <=2 SENSITIVE Sensitive     CEFTAZIDIME <=1 SENSITIVE Sensitive     CEFAZOLIN <=4 SENSITIVE Sensitive     CEFTRIAXONE <=1 SENSITIVE Sensitive     CIPROFLOXACIN <=0.25 SENSITIVE Sensitive     GENTAMICIN <=1 SENSITIVE Sensitive     IMIPENEM <=0.25 SENSITIVE Sensitive     TRIMETH/SULFA <=20 SENSITIVE Sensitive     NITROFURANTOIN Value in next row Sensitive      SENSITIVE<=16    PIP/TAZO Value in next row Sensitive      SENSITIVE<=4    LEVOFLOXACIN Value in next row Sensitive      SENSITIVE<=0.12    * 50,000 COLONIES/mL ESCHERICHIA COLI  Urine culture     Status: None   Collection Time: 06/07/15  4:06 PM  Result Value Ref Range Status   Specimen Description URINE, CLEAN CATCH  Final   Special Requests Normal  Final   Culture MULTIPLE SPECIES PRESENT, SUGGEST RECOLLECTION  Final   Report Status 06/09/2015 FINAL  Final    Radiology Reports Dg Chest 2 View  06/10/2015  CLINICAL DATA:  Productive cough EXAM: CHEST  2 VIEW COMPARISON:  03/18/2015 FINDINGS: Cardiomediastinal silhouette is stable. No acute infiltrate or pleural effusion. No pulmonary edema. Mild thoracic spine osteopenia. IMPRESSION: No active cardiopulmonary disease. Electronically Signed   By: Natasha MeadLiviu  Pop M.D.   On: 06/10/2015 12:10   Mm Digital Screening Bilateral  05/17/2015  CLINICAL DATA:  Screening. EXAM:  DIGITAL SCREENING BILATERAL MAMMOGRAM WITH CAD COMPARISON:  Previous exam(s). ACR Breast Density Category b: There are scattered areas of  fibroglandular density. FINDINGS: There are no findings suspicious for malignancy. Images were processed with CAD. IMPRESSION: No mammographic evidence of malignancy. A result letter of this screening mammogram will be mailed directly to the patient. RECOMMENDATION: Screening mammogram in one year. (Code:SM-B-01Y) BI-RADS CATEGORY  1: Negative. Electronically Signed   By: Bary Richard M.D.   On: 05/17/2015 13:28     CBC  Recent Labs Lab 06/05/15 1932  WBC 9.3  HGB 12.2  HCT 36.4  PLT 313  MCV 93.7  MCH 31.3  MCHC 33.4  RDW 15.6*  LYMPHSABS 2.2  MONOABS 0.6  EOSABS 0.2  BASOSABS 0.1    Chemistries   Recent Labs Lab 06/05/15 1932  06/09/15 1205 06/09/15 1741 06/10/15 0006 06/10/15 0709 06/10/15 1824  NA 136  --   --   --   --   --  132*  K 3.9  --   --   --   --   --  4.3  CL 99*  --   --   --   --   --  97*  CO2 22  --   --   --   --   --  25  GLUCOSE 329*  < > 100* 231* 131* 148* 179*  BUN 19  --   --   --   --   --  18  CREATININE 0.93  --   --   --   --   --  0.87  CALCIUM 9.3  --   --   --   --   --  9.2  AST 23  --   --   --   --   --   --   ALT 12*  --   --   --   --   --   --   ALKPHOS 59  --   --   --   --   --   --   BILITOT 0.6  --   --   --   --   --   --   < > = values in this interval not displayed. ------------------------------------------------------------------------------------------------------------------ estimated creatinine clearance is 51.6 mL/min (by C-G formula based on Cr of 0.87). ------------------------------------------------------------------------------------------------------------------ No results for input(s): HGBA1C in the last 72 hours. ------------------------------------------------------------------------------------------------------------------ No results for input(s): CHOL, HDL, LDLCALC, TRIG, CHOLHDL, LDLDIRECT in the last 72  hours. ------------------------------------------------------------------------------------------------------------------ No results for input(s): TSH, T4TOTAL, T3FREE, THYROIDAB in the last 72 hours.  Invalid input(s): FREET3 ------------------------------------------------------------------------------------------------------------------ No results for input(s): VITAMINB12, FOLATE, FERRITIN, TIBC, IRON, RETICCTPCT in the last 72 hours.  Coagulation profile No results for input(s): INR, PROTIME in the last 168 hours.  No results for input(s): DDIMER in the last 72 hours.  Cardiac Enzymes No results for input(s): CKMB, TROPONINI, MYOGLOBIN in the last 168 hours.  Invalid input(s): CK ------------------------------------------------------------------------------------------------------------------ Invalid input(s): POCBNP    Assessment & Plan   *Diabetes and now hypoglycemia Continue to current treatment, pt will need to f/u primary md dr. Andi Hence   * Coronary artery disease Continue aspirin  * Hypertension Blood pressure stable continue Lopressor  * Hyperlipidemia LDL is more than 100, started on atorvastatin, as she had a coronary artery disease.  * Hypothyroidism Continue Synthroid and repeat TSH as outpatient  All the records are reviewed and case discussed with ED provider. Management plans discussed with the  patient, family and they are in agreement.      Code Status Orders        Start     Ordered   06/06/15 1345  Full code   Continuous     06/06/15 1346              DVT Prophylaxis ambulatory  Lab Results  Component Value Date   PLT 313 06/05/2015     Time Spent in minutes  Auburn Bilberry M.D on 06/12/2015 at 2:12 PM  Between 7am to 6pm - Pager - [phone]  After 6pm go to [email or web address] - password [email or web address] [facility]  [facility]   Office  [phone]

## 2015-06-12 NOTE — Progress Notes (Signed)
  [facility] Adult Case Management Discharge Plan :  Will you be returning to the same living situation after discharge:  Yes,  pt will return home At discharge, do you have transportation home?: Yes,  pt will be picked up by her son. Do you have the ability to pay for your medications: Yes,  pt will be provided with prescriptions at discharge  Release of information consent forms completed and in the chart;  Patient's signature needed at discharge.  Patient to Follow up at: Follow-up Information    Follow [facility] On 07/05/2015.   Why:  Hospital follow up appointment with Dr. Judithann SheenSparks on Friday 07/05/15 at 10:15am.  Please arrive 15 minutes early.  Please call if you need to reschedule.   Contact information:   [address], [place], [place] [address] Phone: [phone] Fax: [phone]      Next level of care provider has [facility] to [facility] Link:yes  Patient denies SI/HI: Yes,  pt denies    Safety Planning and Suicide Prevention discussed: Yes,  completed with son due to pt's hearing loss  Have you used any form of tobacco in the last 30 days? (Cigarettes, Smokeless Tobacco, Cigars, and/or Pipes): No  Has patient been referred to the Quitline?: N/A patient is not a smoker  Mercy RidingJonathan F Mozelle Remlinger 06/12/2015, 11:19 AM

## 2015-06-12 NOTE — [facility] Group Notes (Signed)
[facility] LCSW Aftercare Discharge Planning Group Note   06/12/2015 3:49 PM  Participation Quality:  Did not attend.   Shakeda Pearse L Rigel Filsinger MSW, LCSWA    

## 2015-06-12 NOTE — [facility] Group Notes (Signed)
[facility] Group Notes:  (Nursing/MHT/Case Management/Adjunct)  Date:  06/12/2015  Time:  12:40 PM  Type of Therapy:  Psychoeducational Skills  Participation Level:  Did Not Attend   Lynelle SmokeCara Travis [facility] 06/12/2015, 12:40 PM

## 2015-06-12 NOTE — Clinical Social Work Note (Signed)
CSW spoke with pt's son Danielle Boyer who will pick pt up at approx. 3:30pm.  Pt's son expressed concerns about the pt's ability to care for herself and CSW will provided information to the son upon discharge about assisted living facilities in the [place] area.  CSW spoke with the pt's son about the possibility of Home Health services which he was agreeable to.  CSW consulted with MD and with case manager about Home Health referral.  CSW attempted to contact pt's other son Danielle Boyer, but pt's son did not answer the phone.    CSW spoke with the pt's daughter Danielle Boyer who expressed concerns about pt's ability to mange at home.  CSW discussed possibility of Home Health services and agreed to provide the daughter with a list of ALF's.  Pt's daughter verbalized her agreement to this plan.  Dorothe PeaJonathan F. Nafis Farnan, LCSWA, LCAS

## 2015-06-12 NOTE — Progress Notes (Addendum)
D:Patient aware of discharge this shift . Patient returning home . Patient received all belonging locked up . Patient denies  Suicidal  And homicidal ideations  .  A: Writer instructed on discharge criteria  .[facility]   . Aware  Of follow up appointment . R: Patient left unit with no questions  Or concerns  With son

## 2015-06-12 NOTE — Progress Notes (Signed)
D: Patient affect is flat. She has complaints of a cough. MD was notified and a prescription was ordered. Patient denies SI/HI/AVH. Patient denies pain. When giving the medicine the patient stated she didn't have any money so she appeared to be mildly confused.  A: Medication was given with education. [facility] medicine given. R: Patient calm and cooperative. Pleasant throughout the evening. Some redirection required.

## 2015-06-12 NOTE — Patient Instructions (Signed)
[facility]  [facility] Status[facility] the [facility]: RN and HHA  If patient discharges after hours, please call [phone].   Aurther Lofterry, Rael Tilly Y 06/12/2015, 1:41 PM

## 2015-06-12 NOTE — Discharge Instructions (Signed)
[facility] 

## 2015-06-12 NOTE — Care Management Note (Signed)
Case Management Note  Patient Details  Name: Danielle Boyer MRN: [id number] Date of Birth: 03/18/1941  Subjective/Objective:                    Action/Plan:   Expected Discharge Date:  06/12/15               Expected Discharge Plan:     In-House Referral:     Discharge planning Services     Post Acute Care Choice:    Choice offered to:     DME Arranged:    DME Agency:     HH Arranged:    HH Agency:     Status of Service:     Medicare Important Message Given:    Date Medicare IM Given:    Medicare IM give by:    Date Additional Medicare IM Given:    Additional Medicare Important Message give by:     If discussed at Long Length of Stay Meetings, dates discussed:    Additional Comments:  Georgena Spurlingerry, Jill Ruppe Y, RN 06/12/2015, 1:48 PM

## 2015-06-12 NOTE — [facility] Suicide Risk Assessment (Signed)
[facility] INPATIENT:  Family/Significant Other Suicide Prevention Education  Suicide Prevention Education:  Education Completed; Danielle Boyer, the pt's son [phone] (name of family member/significant other) has been identified by the patient as the family member/significant other with whom the patient will be residing, and identified as the person(s) who will aid the patient in the event of a mental health crisis (suicidal ideations/suicide attempt).  With written consent from the patient, the family member/significant other has been provided the following suicide prevention education, prior to the and/or following the discharge of the patient.  The suicide prevention education provided includes the following:  Suicide risk factors  Suicide prevention and interventions  National Suicide Hotline telephone number  [facility] assessment telephone number  [facility] Emergency Assistance 911  [facility] and/or Residential Mobile Crisis Unit telephone number  Request made of family/significant other to:  Remove weapons (e.g., guns, rifles, knives), all items previously/currently identified as safety concern.    Remove drugs/medications (over-the-counter, prescriptions, illicit drugs), all items previously/currently identified as a safety concern.  The family member/significant other verbalizes understanding of the suicide prevention education information provided.  The family member/significant other agrees to remove the items of safety concern listed above.  Dorothe PeaJonathan F Adriauna Campton 06/12/2015, 10:54 AM

## 2015-06-12 NOTE — Plan of Care (Signed)
Problem: Alteration in thought process Goal: STG-Patient is able to discuss thoughts with staff Outcome: Progressing Patient was able to tell me she had a bad cough and needed something for it.

## 2015-06-21 NOTE — ED Provider Notes (Signed)
[facility] Emergency Department Provider Note  ____________________________________________  Time seen: 12:00 AM  I have reviewed the triage vital signs and the nursing notes.   HISTORY  Chief Complaint Altered Mental Status     HPI Danielle Boyer is a 74 y.o. female [employer] with auditory hallucinations. Patient states voices are telling her "there within a kill her and her dog".Patient denies any suicidal or homicidal ideation     Past Medical History  Diagnosis Date  . Diabetes mellitus without complication (HCC)   . Hypertension     Patient Active Problem List   Diagnosis Date Noted  . 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 Essential hypertension 06/07/2015  . Hearing loss 06/07/2015  . Hypothyroidism 06/07/2015  . Cardiovascular disease 06/07/2015  . Atrial fibrillation (HCC) 06/07/2015  . GERD (gastroesophageal reflux disease) 06/07/2015  . IBS (irritable bowel syndrome) 06/07/2015  . Osteoarthritis 06/07/2015  . Paranoid schizophrenia (HCC)   . Schizophrenia (HCC) 06/06/2015  . Diabetes (HCC) 03/19/2015    Past Surgical History  Procedure Laterality Date  . Abdominal hysterectomy    . Cesarean section      x3    Current Outpatient Rx  Name  Route  Sig  Dispense  Refill  . furosemide (LASIX) 20 MG tablet   Oral   Take 20 mg by mouth daily.         . metFORMIN (GLUCOPHAGE) 1000 MG tablet   Oral   Take 1,000 mg by mouth 2 (two) times daily.         . metoprolol (LOPRESSOR) 50 MG tablet   Oral   Take 50 mg by mouth 2 (two) times daily.         . potassium chloride SA (K-DUR,KLOR-CON) 20 MEQ tablet   Oral   Take 20 mEq by mouth daily.         . ranolazine (RANEXA) 500 MG 12 hr tablet   Oral   Take 500 mg by mouth 2 (two) times daily.         . traZODone (DESYREL) 100 MG tablet   Oral   Take 100 mg by mouth at bedtime.         Marland Kitchen aspirin 81 MG chewable tablet   Oral   Chew 1  tablet (81 mg total) by mouth daily.         Marland Kitchen atorvastatin (LIPITOR) 40 MG tablet   Oral   Take 1 tablet (40 mg total) by mouth daily at 6 PM.   30 tablet   0   . cyanocobalamin 1000 MCG tablet   Oral   Take 1 tablet (1,000 mcg total) by mouth daily.   30 tablet   0   . insulin aspart protamine- aspart (NOVOLOG MIX 70/30) (70-30) 100 UNIT/ML injection   Subcutaneous   Inject 0.12 mLs (12 Units total) into the skin daily with supper.   10 mL   11   . insulin aspart protamine- aspart (NOVOLOG MIX 70/30) (70-30) 100 UNIT/ML injection   Subcutaneous   Inject 0.22 mLs (22 Units total) into the skin daily with breakfast.   10 mL   11   . levothyroxine (SYNTHROID, LEVOTHROID) 50 MCG tablet   Oral   Take 1 tablet (50 mcg total) by mouth daily before breakfast.   30 tablet   0   . nitroGLYCERIN (NITROSTAT) 0.4 MG SL tablet   Sublingual   Place 0.4 mg under the tongue every 5 (  five) minutes as needed for chest pain.         Marland Kitchen. OLANZapine (ZYPREXA) 10 MG tablet   Oral   Take 1 tablet (10 mg total) by mouth at bedtime.   30 tablet   1   . omeprazole (PRILOSEC) 20 MG capsule   Oral   Take 20 mg by mouth 2 (two) times daily.           Allergies Citalopram and Penicillins  Family History  Problem Relation Age of Onset  . Breast cancer Sister     Social History Social History  Substance Use Topics  . Smoking status: Never Smoker   . Smokeless tobacco: Never Used  . Alcohol Use: No    Review of Systems  Constitutional: Negative for fever. Eyes: Negative for visual changes. ENT: Negative for sore throat. Cardiovascular: Negative for chest pain. Respiratory: Negative for shortness of breath. Gastrointestinal: Negative for abdominal pain, vomiting and diarrhea. Genitourinary: Negative for dysuria. Musculoskeletal: Negative for back pain. Skin: Negative for rash. Neurological: Negative for headaches, focal weakness or numbness. Psychiatric: As a for  auditory hallucinations  10-point ROS otherwise negative.  ____________________________________________   PHYSICAL EXAM:  VITAL SIGNS: ED Triage Vitals  Enc Vitals Group     BP 06/03/15 2300 131/74 mmHg     Pulse Rate 06/03/15 2300 85     Resp 06/03/15 2300 20     Temp 06/03/15 2300 98.3 F (36.8 C)     Temp Source 06/03/15 2300 Oral     SpO2 06/03/15 2300 97 %     Weight 06/03/15 2300 162 lb (73.483 kg)     Height 06/03/15 2300 5\' 3"  (1.6 m)     Head Cir --      Peak Flow --      Pain Score 06/04/15 1733 0     Pain Loc --      Pain Edu? --      Excl. in GC? --      Constitutional: Alert and oriented. Well appearing and in no distress. Eyes: Conjunctivae are normal. PERRL. Normal extraocular movements. ENT   Head: Normocephalic and atraumatic.   Nose: No congestion/rhinnorhea.   Mouth/Throat: Mucous membranes are moist.   Neck: No stridor. Hematological/Lymphatic/Immunilogical: No cervical lymphadenopathy. Cardiovascular: Normal rate, regular rhythm. Normal and symmetric distal pulses are present in all extremities. No murmurs, rubs, or gallops. Respiratory: Normal respiratory effort without tachypnea nor retractions. Breath sounds are clear and equal bilaterally. No wheezes/rales/rhonchi. Gastrointestinal: Soft and nontender. No distention. There is no CVA tenderness. Genitourinary: deferred Musculoskeletal: Nontender with normal range of motion in all extremities. No joint effusions.  No lower extremity tenderness nor edema. Neurologic:  Normal speech and language. No gross focal neurologic deficits are appreciated. Speech is normal.  Skin:  Skin is warm, dry and intact. No rash noted. Psychiatric: Mood and affect are normal. appears to be responding to internal stimuli ____________________________________________    LABS (pertinent positives/negatives)  Labs Reviewed  COMPREHENSIVE METABOLIC PANEL - Abnormal; Notable for the following:    Glucose,  Bld 154 (*)    ALT 12 (*)    All other components within normal limits  ACETAMINOPHEN LEVEL - Abnormal; Notable for the following:    Acetaminophen (Tylenol), Serum <10 (*)    All other components within normal limits  CBC - Abnormal; Notable for the following:    Hemoglobin 11.9 (*)    RDW 15.4 (*)    All other components within normal limits  URINALYSIS  COMPLETEWITH MICROSCOPIC ([facility] ONLY) - Abnormal; Notable for the following:    Color, Urine YELLOW (*)    APPearance CLEAR (*)    Glucose, UA 150 (*)    Leukocytes, UA 2+ (*)    Bacteria, UA RARE (*)    Squamous Epithelial / LPF 0-5 (*)    All other components within normal limits  URINE CULTURE  ETHANOL  SALICYLATE LEVEL  URINE DRUG SCREEN, QUALITATIVE ([facility] ONLY)       INITIAL IMPRESSION / ASSESSMENT AND PLAN / ED COURSE  Pertinent labs & imaging results that were available during my care of the patient were reviewed by me and considered in my medical decision making (see chart for details).    ____________________________________________   FINAL CLINICAL IMPRESSION(S) / ED DIAGNOSES  Final diagnoses:  Auditory hallucinations  UTI (lower urinary tract infection)      Darci Current, MD 06/21/15 [phone]

## 2015-07-22 ENCOUNTER — Emergency Department: Payer: Medicare Other

## 2015-07-22 ENCOUNTER — Emergency Department
Admission: EM | Admit: 2015-07-22 | Discharge: 2015-07-22 | Disposition: A | Discharge status: Home / Self Care | Payer: Medicare Other | Attending: Emergency Medicine | Admitting: Emergency Medicine

## 2015-07-22 DIAGNOSIS — Z043 Encounter for examination and observation following other accident: Secondary | ICD-10-CM | POA: Insufficient documentation

## 2015-07-22 DIAGNOSIS — E860 Dehydration: Secondary | ICD-10-CM | POA: Diagnosis not present

## 2015-07-22 DIAGNOSIS — Z7984 Long term (current) use of oral hypoglycemic drugs: Secondary | ICD-10-CM | POA: Diagnosis not present

## 2015-07-22 DIAGNOSIS — Y9389 Activity, other specified: Secondary | ICD-10-CM | POA: Insufficient documentation

## 2015-07-22 DIAGNOSIS — E876 Hypokalemia: Secondary | ICD-10-CM | POA: Diagnosis not present

## 2015-07-22 DIAGNOSIS — Z794 Long term (current) use of insulin: Secondary | ICD-10-CM | POA: Diagnosis not present

## 2015-07-22 DIAGNOSIS — R42 Dizziness and giddiness: Secondary | ICD-10-CM | POA: Diagnosis present

## 2015-07-22 DIAGNOSIS — Z79899 Other long term (current) drug therapy: Secondary | ICD-10-CM | POA: Insufficient documentation

## 2015-07-22 DIAGNOSIS — R0789 Other chest pain: Secondary | ICD-10-CM | POA: Diagnosis not present

## 2015-07-22 DIAGNOSIS — Y998 Other external cause status: Secondary | ICD-10-CM | POA: Insufficient documentation

## 2015-07-22 DIAGNOSIS — W1839XA Other fall on same level, initial encounter: Secondary | ICD-10-CM | POA: Insufficient documentation

## 2015-07-22 DIAGNOSIS — M545 Low back pain: Secondary | ICD-10-CM | POA: Insufficient documentation

## 2015-07-22 DIAGNOSIS — Y9289 Other specified places as the place of occurrence of the external cause: Secondary | ICD-10-CM | POA: Diagnosis not present

## 2015-07-22 DIAGNOSIS — I10 Essential (primary) hypertension: Secondary | ICD-10-CM | POA: Diagnosis not present

## 2015-07-22 DIAGNOSIS — F200 Paranoid schizophrenia: Secondary | ICD-10-CM | POA: Diagnosis not present

## 2015-07-22 DIAGNOSIS — R350 Frequency of micturition: Secondary | ICD-10-CM | POA: Diagnosis not present

## 2015-07-22 DIAGNOSIS — R531 Weakness: Secondary | ICD-10-CM | POA: Diagnosis not present

## 2015-07-22 DIAGNOSIS — E119 Type 2 diabetes mellitus without complications: Secondary | ICD-10-CM | POA: Diagnosis not present

## 2015-07-22 DIAGNOSIS — Z7982 Long term (current) use of aspirin: Secondary | ICD-10-CM | POA: Insufficient documentation

## 2015-07-22 DIAGNOSIS — Z880 Allergy status to penicillin: Secondary | ICD-10-CM | POA: Diagnosis not present

## 2015-07-22 LAB — URINALYSIS COMPLETE WITH MICROSCOPIC (ARMC ONLY)
Bilirubin Urine: NEGATIVE
Glucose, UA: >500 mg/dL — AB
HGB URINE DIPSTICK: NEGATIVE
NITRITE: NEGATIVE
PH: 7 (ref 5.0–8.0)
PROTEIN: NEGATIVE mg/dL
SPECIFIC GRAVITY, URINE: 1.014 (ref 1.005–1.030)

## 2015-07-22 LAB — BASIC METABOLIC PANEL
Anion gap: 11 (ref 5–15)
BUN: 9 mg/dL (ref 6–20)
CHLORIDE: 103 mmol/L (ref 101–111)
CO2: 30 mmol/L (ref 22–32)
CREATININE: 0.81 mg/dL (ref 0.44–1.00)
Calcium: 9.8 mg/dL (ref 8.9–10.3)
GFR calc non Af Amer: >60 mL/min (ref >60)
Glucose, Bld: 327 mg/dL — ABNORMAL HIGH (ref 65–99)
POTASSIUM: 2.9 mmol/L — AB (ref 3.5–5.1)
SODIUM: 144 mmol/L (ref 135–145)

## 2015-07-22 LAB — CK: Total CK: 136 U/L (ref 38–234)

## 2015-07-22 LAB — CBC
HCT: 33.3 % — ABNORMAL LOW (ref 35.0–47.0)
Hemoglobin: 10.7 g/dL — ABNORMAL LOW (ref 12.0–16.0)
MCH: 28.5 pg (ref 26.0–34.0)
MCHC: 32.1 g/dL (ref 32.0–36.0)
MCV: 88.8 fL (ref 80.0–100.0)
PLATELETS: 265 10*3/uL (ref 150–440)
RBC: 3.75 MIL/uL — AB (ref 3.80–5.20)
RDW: 13.5 % (ref 11.5–14.5)
WBC: 7.8 10*3/uL (ref 3.6–11.0)

## 2015-07-22 LAB — TROPONIN I: Troponin I: <0.03 ng/mL (ref <0.031)

## 2015-07-22 NOTE — ED Notes (Signed)
Attempted non AC IV access twice.  Unable to get in forearms.

## 2015-07-22 NOTE — ED Provider Notes (Signed)
Time Seen: Approximately ----------------------------------------- 11:25 AM on 07/22/2015 -----------------------------------------   I have reviewed the triage notes  Chief Complaint: Dizziness   History of Present Illness: Danielle Boyer is a 75 y.o. female who is a extremely vague historian he was transported here by EMS due to feelings of dizziness and falls. Patient states she's fallen twice over the last 24 hours and states that she feels like she is listing to the right. She states that she's had some occasional left-sided chest discomfort but otherwise denies any acute complaints of pain. Patient has a long history of psychiatric disease with paranoid schizophrenia. She denies any recent hallucinations. She denies any suicidal thoughts, homicidal thoughts. She states with feelings of being off balance that she had a hard time getting up off the floor. Descriptions of her discomfort seemed again to be somewhat vague and will periodically say that she's "" hurting all over "". She denies any neck or thoracic pain. She states she has some mild low back discomfort.   Past Medical History  Diagnosis Date  . Diabetes mellitus without complication (HCC)   . Hypertension     Patient Active Problem List   Diagnosis Date Noted  . 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 Essential hypertension 06/07/2015  . Hearing loss 06/07/2015  . Hypothyroidism 06/07/2015  . Cardiovascular disease 06/07/2015  . Atrial fibrillation (HCC) 06/07/2015  . GERD (gastroesophageal reflux disease) 06/07/2015  . IBS (irritable bowel syndrome) 06/07/2015  . Osteoarthritis 06/07/2015  . Paranoid schizophrenia (HCC)   . Schizophrenia (HCC) 06/06/2015  . Diabetes (HCC) 03/19/2015    Past Surgical History  Procedure Laterality Date  . Abdominal hysterectomy    . Cesarean section      x3    Past Surgical History  Procedure Laterality Date  . Abdominal hysterectomy    . Cesarean section      x3     Current Outpatient Rx  Name  Route  Sig  Dispense  Refill  . aspirin 81 MG chewable tablet   Oral   Chew 1 tablet (81 mg total) by mouth daily.         Marland Kitchen atorvastatin (LIPITOR) 40 MG tablet   Oral   Take 1 tablet (40 mg total) by mouth daily at 6 PM.   30 tablet   0   . cyanocobalamin 1000 MCG tablet   Oral   Take 1 tablet (1,000 mcg total) by mouth daily.   30 tablet   0   . furosemide (LASIX) 20 MG tablet   Oral   Take 20 mg by mouth daily.         . insulin aspart protamine- aspart (NOVOLOG MIX 70/30) (70-30) 100 UNIT/ML injection   Subcutaneous   Inject 0.12 mLs (12 Units total) into the skin daily with supper.   10 mL   11   . insulin aspart protamine- aspart (NOVOLOG MIX 70/30) (70-30) 100 UNIT/ML injection   Subcutaneous   Inject 0.22 mLs (22 Units total) into the skin daily with breakfast.   10 mL   11   . levothyroxine (SYNTHROID, LEVOTHROID) 50 MCG tablet   Oral   Take 1 tablet (50 mcg total) by mouth daily before breakfast.   30 tablet   0   . metFORMIN (GLUCOPHAGE) 1000 MG tablet   Oral   Take 1,000 mg by mouth 2 (two) times daily.         . metoprolol (LOPRESSOR) 50 MG tablet   Oral  Take 50 mg by mouth 2 (two) times daily.         . nitroGLYCERIN (NITROSTAT) 0.4 MG SL tablet   Sublingual   Place 0.4 mg under the tongue every 5 (five) minutes as needed for chest pain.         Marland Kitchen OLANZapine (ZYPREXA) 10 MG tablet   Oral   Take 1 tablet (10 mg total) by mouth at bedtime.   30 tablet   1   . omeprazole (PRILOSEC) 20 MG capsule   Oral   Take 20 mg by mouth 2 (two) times daily.         . potassium chloride SA (K-DUR,KLOR-CON) 20 MEQ tablet   Oral   Take 20 mEq by mouth daily.         . ranolazine (RANEXA) 500 MG 12 hr tablet   Oral   Take 500 mg by mouth 2 (two) times daily.         . traZODone (DESYREL) 100 MG tablet   Oral   Take 100 mg by mouth at bedtime.           Allergies:  Citalopram and  Penicillins  Family History: Family History  Problem Relation Age of Onset  . Breast cancer Sister     Social History: Social History  Substance Use Topics  . Smoking status: Never Smoker   . Smokeless tobacco: Never Used  . Alcohol Use: No     Review of Systems:   10 point review of systems was performed and was otherwise negative:  Constitutional: No fever Eyes: No visual disturbances ENT: No sore throat, ear pain Cardiac: Periodic right-sided chest discomfort Respiratory: No shortness of breath, wheezing, or stridor Abdomen: No abdominal pain, no vomiting, No diarrhea Endocrine: No weight loss, No night sweats Extremities: No peripheral edema, cyanosis Skin: No rashes, easy bruising Neurologic: No focal weakness, trouble with speech or swollowing Urologic: No dysuria, Hematuria, she states she urinates frequency lead up to 3 times a day  Physical Exam:  ED Triage Vitals  Enc Vitals Group     BP 07/22/15 1026 150/118 mmHg     Pulse Rate 07/22/15 1026 84     Resp 07/22/15 1026 13     Temp --      Temp src --      SpO2 07/22/15 1026 97 %     Weight 07/22/15 1026 155 lb (70.308 kg)     Height 07/22/15 1026 5\' 5"  (1.651 m)     Head Cir --      Peak Flow --      Pain Score 07/22/15 1027 0     Pain Loc --      Pain Edu? --      Excl. in GC? --     General: Awake , Alert , and Oriented times 3; GCS 15 difficult historian Head: Normal cephalic , atraumatic Eyes: Pupils equal , round, reactive to light no nystagmus Nose/Throat: No nasal drainage, patent upper airway without erythema or exudate.  Neck: Supple, Full range of motion, No anterior adenopathy or palpable thyroid masses Lungs: Clear to ascultation without wheezes , rhonchi, or rales Heart: Regular rate, regular rhythm without murmurs , gallops , or rubs Abdomen: Soft, non tender without rebound, guarding , or rigidity; bowel sounds positive and symmetric in all 4 quadrants. No organomegaly .         Extremities: 2 plus symmetric pulses. No edema, clubbing or cyanosis Neurologic: She describes difficulty in  lifting her legs up off the stretcher due to discomfort in her lower back but she has good motor strength. No sensory deficits Skin: warm, dry, no rashes   Labs:   All laboratory work was reviewed including any pertinent negatives or positives listed below:  Labs Reviewed  BASIC METABOLIC PANEL - Abnormal; Notable for the following:    Potassium 2.9 (*)    Glucose, Bld 327 (*)    All other components within normal limits  CBC - Abnormal; Notable for the following:    RBC 3.75 (*)    Hemoglobin 10.7 (*)    HCT 33.3 (*)    All other components within normal limits  URINALYSIS COMPLETEWITH MICROSCOPIC ([facility] ONLY)  TROPONIN I  CK  CBG MONITORING, ED   reviewed the patient's laboratory work shows some glucose in the urine but otherwise denies some mild ketosis patient has some low potassium at 2.9 and was given oral potassium here in the emergency department. Her bicarbonate appears to be within normal limits and there is no signs of acidosis.  EKG:  ED ECG REPORT I, Jennye Moccasin, the attending physician, personally viewed and interpreted this ECG.  Date: 07/22/2015 EKG Time: 1028 Rate: 79 Rhythm: normal sinus rhythm QRS Axis: normal Intervals: normal ST/T Wave abnormalities: normal Conduction Disutrbances: none Narrative Interpretation: unremarkable    EXAM: CHEST 2 VIEW  COMPARISON: 06/10/2015 chest radiograph.  FINDINGS: Stable cardiomediastinal silhouette with normal heart size. No pneumothorax. No pleural effusion. Lungs appear clear, with no acute consolidative airspace disease and no pulmonary edema.  IMPRESSION: No active cardiopulmonary disease.   Electronically Signed By: Delbert Phenix M.D. On: 07/22/2015 12:37          DG Lumbar Spine 2-3 Views (Final result) Result time: 07/22/15 12:39:41   Final result by Rad Results In  Interface (07/22/15 12:39:41)   Narrative:   CLINICAL DATA: 75 year old female with lumbar spine pain following a fall  EXAM: LUMBAR SPINE - 2-3 VIEW  COMPARISON: Prior CT abdomen/ pelvis 02/09/2011  FINDINGS: There is no evidence of acute fracture or malalignment. Vertebral body heights are maintained. There is levoconvex and slightly rotary scoliosis centered at L3-L4. Transitional anatomy is noted with partial lumbarization of the S1 vertebral body. Multilevel degenerative disc disease most notable at L2-L3. Atherosclerotic calcifications noted in the abdominal aorta. No visualized aneurysm. Normal bony mineralization without lytic or blastic osseous lesion. The bowel gas pattern is unremarkable.  IMPRESSION: 1. No acute fracture or malalignment. 2. 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 3. Levoconvex and slightly rotary scoliosis centered at L3-L4. 4. Mild multilevel degenerative disc disease. 5. Aortic atherosclerosis.   Electronically Signed By: Malachy Moan M.D. On: 07/22/2015 12:39          CT Head Wo Contrast (Final result) Result time: 07/22/15 12:25:38   Final result by Rad Results In Interface (07/22/15 12:25:38)   Narrative:   CLINICAL DATA: Larey Seat yesterday due to dizziness.  EXAM: CT HEAD WITHOUT CONTRAST  TECHNIQUE: Contiguous axial images were obtained from the base of the skull through the vertex without intravenous contrast.  COMPARISON: Head CT 04/24/2010 and MRI brain 07/17/2014  FINDINGS: Stable age related cerebral atrophy, ventriculomegaly and periventricular white matter disease. No extra-axial fluid collections are identified. No CT findings for acute hemispheric infarction or intracranial hemorrhage. No mass lesions. Stable dilated perivascular space versus remote lacunar infarct right basal ganglia area. The brainstem and cerebellum are normal.  The bony structures are intact. No  acute skull fracture or bone lesion. The paranasal sinuses and but does not appear to have any acute exacerbation requiring iglobes are intact. Stable vascular calcifications.  IMPRESSION: Stable age related cerebral atrophy, ventriculomegaly and periventricular white matter disease.  No acute intracranial findings or skull fracture.      Radiology:     I personally reviewed the radiologic studies     ED Course: Patient was given oral potassium and some IV fluids here in emergency department. The patient has been up and ambulatory though require some assistance. She states 2 previous falls at home, but I cannot ascertain any significant injuries from these falls. The patient's laboratory work reviewed and showed hypokalemia and some glucose in her urine without significant signs of dehydration. I felt if she got back on her medication that she would be able to tolerate outpatient treatment. She does have a sinus going to pick her up in an hour. Patient had home assessment paperwork filled out to assist both him and his mother at home for physical therapy and also the nursing assessment.    Assessment:  Status post fall with no significant injury History of schizophrenia Mild dehydration Hypokalemia      Plan: * Outpatient management Patient was advised to return immediately if condition worsens. Patient was advised to follow up with their primary care physician or other specialized physicians involved in their outpatient care             Jennye MoccasinBrian S Asencion Guisinger, MD 07/22/15 1635

## 2015-07-22 NOTE — ED Notes (Signed)
Pt bib EMS w/ c/o fall yesterday r/t dizziness.  Pt reports falling on the floor twice.  Pt did not seek medical attention at time of fall, pt sts that she hurt "all over" today, which pt sts is normal.  Pt is noncompliant w/ medications.  Pt sts she not take AM meds today.  Pt A/Ox4.  Pt CBG 346 per EMS

## 2015-07-22 NOTE — ED Notes (Signed)
CRITICAL VALUE ALERT  Critical value received:  K 2.9  Date of notification:  07/22/2015  Time of notification:  1120  Critical value read back:Yes.    Nurse who received alert:  Janine OresAmber rn  MD notified (1st page):  Huel Cotequigley  Time of first page:  1120  MD notified (2nd page):  Time of second page:  Responding MD:  Huel Cotequigley  Time MD responded:  1125

## 2015-07-22 NOTE — Discharge Instructions (Signed)
Weakness Weakness is a lack of strength. It may be felt all over the body (generalized) or in one specific part of the body (focal). Some causes of weakness can be serious. You may need further medical evaluation, especially if you are elderly or you have a history of immunosuppression (such as chemotherapy or HIV), kidney disease, heart disease, or diabetes. CAUSES  Weakness can be caused by many different things, including:  Infection.  Physical exhaustion.  Internal bleeding or other blood loss that results in a lack of red blood cells (anemia).  Dehydration. This cause is more common in elderly people.  Side effects or electrolyte abnormalities from medicines, such as pain medicines or sedatives.  Emotional distress, anxiety, or depression.  Circulation problems, especially severe peripheral arterial disease.  Heart disease, such as rapid atrial fibrillation, bradycardia, or heart failure.  Nervous system disorders, such as Guillain-Barr syndrome, multiple sclerosis, or stroke. DIAGNOSIS  To find the cause of your weakness, your caregiver will take your history and perform a physical exam. Lab tests or X-rays may also be ordered, if needed. TREATMENT  Treatment of weakness depends on the cause of your symptoms and can vary greatly. HOME CARE INSTRUCTIONS   Rest as needed.  Eat a well-balanced diet.  Try to get some exercise every day.  Only take over-the-counter or prescription medicines as directed by your caregiver. SEEK MEDICAL CARE IF:   Your weakness seems to be getting worse or spreads to other parts of your body.  You develop new aches or pains. SEEK IMMEDIATE MEDICAL CARE IF:   You cannot perform your normal daily activities, such as getting dressed and feeding yourself.  You cannot walk up and down stairs, or you feel exhausted when you do so.  You have shortness of breath or chest pain.  You have difficulty moving parts of your body.  You have weakness  in only one area of the body or on only one side of the body.  You have a fever.  You have trouble speaking or swallowing.  You cannot control your bladder or bowel movements.  You have black or bloody vomit or stools. MAKE SURE YOU:  Understand these instructions.  Will watch your condition.  Will get help right away if you are not doing well or get worse.   This information is not intended to replace advice given to you by your health care provider. Make sure you discuss any questions you have with your health care provider.   Document Released: 06/22/2005 Document Revised: 12/22/2011 Document Reviewed: 08/21/2011 Elsevier Interactive Patient Education [employer]2016 [employer]  Please return immediately if condition worsens. Please contact her primary physician or the physician you were given for referral. If you have any specialist physicians involved in her treatment and plan please also contact them. Thank you for using [facility]. Please continue all your normal medications. Continue with your medications for diabetes. Drink plenty of fluids. Please contact her primary physician for further outpatient follow-up

## 2015-07-23 NOTE — Care Management Note (Signed)
Case Management Note  Patient Details  Name: ADELISE BUSWELL MRN: [id number] Date of Birth: [birth year]  Subjective/Objective:   Call from Dr Huel Cote after leaving yesterday to place [facility] for RN and PT. Started to place request today and after apeaking with son, Mendel Ryder at [phone], am researching previous order that should still be in place. Son does not remember agency name. He states the [facility] person came 3 days and never returned, and he does not know why. I have found out [facility] was the agency, but have jason determining what happened and whether or not HH can be reinstituted.                 Action/Plan:   Expected Discharge Date:                  Expected Discharge Plan:     In-House Referral:     Discharge planning Services     Post Acute Care Choice:    Choice offered to:     DME Arranged:    DME Agency:     HH Arranged:    HH Agency:     Status of Service:     Medicare Important Message Given:    Date Medicare IM Given:    Medicare IM give by:    Date Additional Medicare IM Given:    Additional Medicare Important Message give by:     If discussed at Long Length of Stay Meetings, dates discussed:    Additional Comments:  Berna Bue, RN 07/23/2015, 9:32 AM

## 2015-07-23 NOTE — Care Management Note (Signed)
Case Management Note  Patient Details  Name: Danielle Boyer MRN: [id number] Date of Birth: 1940/11/01  Subjective/Objective:  Spoke to [name] from [facility], and he has verified the pt. Was seen for 3    Days, and can no longer be referred ti them due to the pts. Inability to participate with her psych . Issues. i also spoke to tracy in Dr Judithann Sheen office and she had no luck trying to find an agency that would take the pt. Either. I will continue to try agencies.               Action/Plan:   Expected Discharge Date:                  Expected Discharge Plan:     In-House Referral:     Discharge planning Services     Post Acute Care Choice:    Choice offered to:     DME Arranged:    DME Agency:     HH Arranged:    HH Agency:     Status of Service:     Medicare Important Message Given:    Date Medicare IM Given:    Medicare IM give by:    Date Additional Medicare IM Given:    Additional Medicare Important Message give by:     If discussed at Long Length of Stay Meetings, dates discussed:    Additional Comments:  Berna Bue, RN 07/23/2015, 9:39 AM

## 2015-07-23 NOTE — Care Management Note (Signed)
Case Management Note  Patient Details  Name: Danielle Boyer MRN: [id number] Date of Birth: [birth year]  Subjective/Objective:       Was able to refer pt. To [place] who also has a SW. Documentation faxed to [phone]             Action/Plan:   Expected Discharge Date:                  Expected Discharge Plan:     In-House Referral:     Discharge planning Services     Post Acute Care Choice:    Choice offered to:     DME Arranged:    DME Agency:     HH Arranged:    HH Agency:     Status of Service:     Medicare Important Message Given:    Date Medicare IM Given:    Medicare IM give by:    Date Additional Medicare IM Given:    Additional Medicare Important Message give by:     If discussed at Long Length of Stay Meetings, dates discussed:    Additional Comments:  Berna Bue, RN 07/23/2015, 10:07 AM

## 2015-09-05 ENCOUNTER — Ambulatory Visit: Payer: 59 | Admitting: Psychiatry

## 2015-09-09 ENCOUNTER — Ambulatory Visit: Payer: 59 | Admitting: Psychiatry

## 2016-02-29 ENCOUNTER — Emergency Department: Payer: Medicare Other

## 2016-02-29 ENCOUNTER — Encounter: Payer: Self-pay | Admitting: Emergency Medicine

## 2016-02-29 ENCOUNTER — Inpatient Hospital Stay
Admission: EM | Admit: 2016-02-29 | Discharge: 2016-03-03 | DRG: 470 | Disposition: A | Discharge status: Skilled Nursing Facility | Payer: Medicare Other | Attending: Internal Medicine | Admitting: Internal Medicine

## 2016-02-29 DIAGNOSIS — Y92099 Unspecified place in other non-institutional residence as the place of occurrence of the external cause: Secondary | ICD-10-CM

## 2016-02-29 DIAGNOSIS — B9620 Unspecified Escherichia coli [E. coli] as the cause of diseases classified elsewhere: Secondary | ICD-10-CM | POA: Diagnosis present

## 2016-02-29 DIAGNOSIS — D638 Anemia in other chronic diseases classified elsewhere: Secondary | ICD-10-CM | POA: Diagnosis present

## 2016-02-29 DIAGNOSIS — Z9889 Other specified postprocedural states: Secondary | ICD-10-CM

## 2016-02-29 DIAGNOSIS — F0390 Unspecified dementia without behavioral disturbance: Secondary | ICD-10-CM | POA: Diagnosis present

## 2016-02-29 DIAGNOSIS — Z880 Allergy status to penicillin: Secondary | ICD-10-CM

## 2016-02-29 DIAGNOSIS — I10 Essential (primary) hypertension: Secondary | ICD-10-CM | POA: Diagnosis present

## 2016-02-29 DIAGNOSIS — S72002A Fracture of unspecified part of neck of left femur, initial encounter for closed fracture: Secondary | ICD-10-CM | POA: Diagnosis present

## 2016-02-29 DIAGNOSIS — W010XXA Fall on same level from slipping, tripping and stumbling without subsequent striking against object, initial encounter: Secondary | ICD-10-CM | POA: Diagnosis present

## 2016-02-29 DIAGNOSIS — Z8781 Personal history of (healed) traumatic fracture: Secondary | ICD-10-CM

## 2016-02-29 DIAGNOSIS — Z803 Family history of malignant neoplasm of breast: Secondary | ICD-10-CM | POA: Diagnosis not present

## 2016-02-29 DIAGNOSIS — Z794 Long term (current) use of insulin: Secondary | ICD-10-CM

## 2016-02-29 DIAGNOSIS — N390 Urinary tract infection, site not specified: Secondary | ICD-10-CM | POA: Diagnosis present

## 2016-02-29 DIAGNOSIS — E119 Type 2 diabetes mellitus without complications: Secondary | ICD-10-CM | POA: Diagnosis present

## 2016-02-29 DIAGNOSIS — E876 Hypokalemia: Secondary | ICD-10-CM | POA: Diagnosis present

## 2016-02-29 DIAGNOSIS — Z79899 Other long term (current) drug therapy: Secondary | ICD-10-CM | POA: Diagnosis not present

## 2016-02-29 DIAGNOSIS — I2510 Atherosclerotic heart disease of native coronary artery without angina pectoris: Secondary | ICD-10-CM | POA: Diagnosis present

## 2016-02-29 HISTORY — DX: Fracture of unspecified part of neck of unspecified femur, initial encounter for closed fracture: S72.009A

## 2016-02-29 HISTORY — DX: Atherosclerotic heart disease of native coronary artery without angina pectoris: I25.10

## 2016-02-29 LAB — URINALYSIS COMPLETE WITH MICROSCOPIC (ARMC ONLY)
BILIRUBIN URINE: NEGATIVE
Glucose, UA: NEGATIVE mg/dL
KETONES UR: NEGATIVE mg/dL
NITRITE: POSITIVE — AB
PH: 5 (ref 5.0–8.0)
PROTEIN: NEGATIVE mg/dL
SPECIFIC GRAVITY, URINE: 1.01 (ref 1.005–1.030)

## 2016-02-29 LAB — SURGICAL PCR SCREEN
MRSA, PCR: NEGATIVE
STAPHYLOCOCCUS AUREUS: NEGATIVE

## 2016-02-29 LAB — GLUCOSE, CAPILLARY
GLUCOSE-CAPILLARY: 225 mg/dL — AB (ref 65–99)
GLUCOSE-CAPILLARY: 168 mg/dL — AB (ref 65–99)
Glucose-Capillary: 254 mg/dL — ABNORMAL HIGH (ref 65–99)

## 2016-02-29 LAB — BASIC METABOLIC PANEL
ANION GAP: 8 (ref 5–15)
BUN: 14 mg/dL (ref 6–20)
CALCIUM: 8.7 mg/dL — AB (ref 8.9–10.3)
CO2: 25 mmol/L (ref 22–32)
Chloride: 107 mmol/L (ref 101–111)
Creatinine, Ser: 0.61 mg/dL (ref 0.44–1.00)
Glucose, Bld: 203 mg/dL — ABNORMAL HIGH (ref 65–99)
POTASSIUM: 4 mmol/L (ref 3.5–5.1)
SODIUM: 140 mmol/L (ref 135–145)

## 2016-02-29 LAB — CBC WITH DIFFERENTIAL/PLATELET
BASOS ABS: 0.1 10*3/uL (ref 0–0.1)
Basophils Relative: 1 %
Eosinophils Absolute: 0.4 10*3/uL (ref 0–0.7)
Eosinophils Relative: 4 %
HEMATOCRIT: 33.5 % — AB (ref 35.0–47.0)
HEMOGLOBIN: 11.2 g/dL — AB (ref 12.0–16.0)
LYMPHS ABS: 2 10*3/uL (ref 1.0–3.6)
LYMPHS PCT: 23 %
MCH: 30.6 pg (ref 26.0–34.0)
MCHC: 33.5 g/dL (ref 32.0–36.0)
MCV: 91.5 fL (ref 80.0–100.0)
Monocytes Absolute: 0.5 10*3/uL (ref 0.2–0.9)
Monocytes Relative: 6 %
NEUTROS ABS: 5.7 10*3/uL (ref 1.4–6.5)
Neutrophils Relative %: 66 %
Platelets: 266 10*3/uL (ref 150–440)
RBC: 3.66 MIL/uL — AB (ref 3.80–5.20)
RDW: 15 % — ABNORMAL HIGH (ref 11.5–14.5)
WBC: 8.6 10*3/uL (ref 3.6–11.0)

## 2016-02-29 LAB — CBC
HCT: 31.9 % — ABNORMAL LOW (ref 35.0–47.0)
Hemoglobin: 10.9 g/dL — ABNORMAL LOW (ref 12.0–16.0)
MCH: 30.8 pg (ref 26.0–34.0)
MCHC: 34.2 g/dL (ref 32.0–36.0)
MCV: 89.9 fL (ref 80.0–100.0)
PLATELETS: 234 10*3/uL (ref 150–440)
RBC: 3.55 MIL/uL — AB (ref 3.80–5.20)
RDW: 14.9 % — AB (ref 11.5–14.5)
WBC: 8.5 10*3/uL (ref 3.6–11.0)

## 2016-02-29 LAB — PROTIME-INR
INR: 1.04
Prothrombin Time: 13.6 seconds (ref 11.4–15.2)

## 2016-02-29 LAB — TYPE AND SCREEN
ABO/RH(D): A POS
ANTIBODY SCREEN: NEGATIVE

## 2016-02-29 LAB — APTT: aPTT: 28 seconds (ref 24–36)

## 2016-02-29 MED ORDER — INSULIN ASPART 100 UNIT/ML ~~LOC~~ SOLN
0.0000 [IU] | Freq: Every day | SUBCUTANEOUS | Status: DC
  Administered 2016-02-29: 3 [IU] via SUBCUTANEOUS
  Administered 2016-03-01: 5 [IU] via SUBCUTANEOUS
  Filled 2016-02-29: qty 7
  Filled 2016-02-29: qty 3

## 2016-02-29 MED ORDER — MELATONIN 5 MG PO TABS
5.0000 mg | ORAL_TABLET | Freq: Every day | ORAL | Status: DC
  Administered 2016-02-29 – 2016-03-02 (×3): 5 mg via ORAL
  Filled 2016-02-29 (×5): qty 1

## 2016-02-29 MED ORDER — METHOCARBAMOL 500 MG PO TABS: 500.0000 mg | ORAL_TABLET | Freq: Four times a day (QID) | ORAL | Status: DC | PRN

## 2016-02-29 MED ORDER — OLANZAPINE 10 MG PO TABS
10.0000 mg | ORAL_TABLET | Freq: Every day | ORAL | Status: DC
  Administered 2016-02-29 – 2016-03-02 (×3): 10 mg via ORAL
  Filled 2016-02-29 (×3): qty 1

## 2016-02-29 MED ORDER — ONDANSETRON HCL 4 MG PO TABS: 4.0000 mg | ORAL_TABLET | Freq: Four times a day (QID) | ORAL | Status: DC | PRN

## 2016-02-29 MED ORDER — TRAZODONE HCL 100 MG PO TABS
100.0000 mg | ORAL_TABLET | Freq: Every day | ORAL | Status: DC
  Administered 2016-02-29 – 2016-03-02 (×3): 100 mg via ORAL
  Filled 2016-02-29 (×3): qty 1

## 2016-02-29 MED ORDER — INSULIN ASPART PROT & ASPART (70-30 MIX) 100 UNIT/ML ~~LOC~~ SUSP: 18.0000 [IU] | Freq: Every day | SUBCUTANEOUS | Status: DC

## 2016-02-29 MED ORDER — SENNOSIDES-DOCUSATE SODIUM 8.6-50 MG PO TABS: 1.0000 | ORAL_TABLET | Freq: Every evening | ORAL | Status: DC | PRN

## 2016-02-29 MED ORDER — DEXTROSE 5 % IV SOLN
1.0000 g | Freq: Once | INTRAVENOUS | Status: AC
  Administered 2016-02-29: 1 g via INTRAVENOUS
  Filled 2016-02-29: qty 10

## 2016-02-29 MED ORDER — HYDROCODONE-ACETAMINOPHEN 5-325 MG PO TABS
1.0000 | ORAL_TABLET | ORAL | Status: DC | PRN
  Administered 2016-02-29 – 2016-03-03 (×4): 1 via ORAL
  Filled 2016-02-29 (×4): qty 1

## 2016-02-29 MED ORDER — CEFTRIAXONE SODIUM 1 G IJ SOLR
1.0000 g | INTRAMUSCULAR | Status: DC
  Administered 2016-03-01 – 2016-03-02 (×2): 1 g via INTRAVENOUS
  Filled 2016-02-29 (×2): qty 10

## 2016-02-29 MED ORDER — INSULIN ASPART PROT & ASPART (70-30 MIX) 100 UNIT/ML ~~LOC~~ SUSP: 12.0000 [IU] | Freq: Every day | SUBCUTANEOUS | Status: DC

## 2016-02-29 MED ORDER — MORPHINE SULFATE (PF) 4 MG/ML IV SOLN
4.0000 mg | Freq: Once | INTRAVENOUS | Status: AC
  Administered 2016-02-29: 4 mg via INTRAVENOUS
  Filled 2016-02-29: qty 1

## 2016-02-29 MED ORDER — ACETAMINOPHEN 325 MG PO TABS
650.0000 mg | ORAL_TABLET | Freq: Four times a day (QID) | ORAL | Status: DC | PRN
  Administered 2016-03-02: 650 mg via ORAL
  Filled 2016-02-29: qty 2

## 2016-02-29 MED ORDER — LEVOTHYROXINE SODIUM 50 MCG PO TABS
50.0000 ug | ORAL_TABLET | Freq: Every day | ORAL | Status: DC
  Administered 2016-03-02 – 2016-03-03 (×2): 50 ug via ORAL
  Filled 2016-02-29 (×2): qty 1

## 2016-02-29 MED ORDER — LORAZEPAM 0.5 MG PO TABS
0.5000 mg | ORAL_TABLET | Freq: Three times a day (TID) | ORAL | Status: DC | PRN
  Administered 2016-02-29: 0.5 mg via ORAL
  Filled 2016-02-29: qty 1

## 2016-02-29 MED ORDER — BISACODYL 5 MG PO TBEC: 5.0000 mg | DELAYED_RELEASE_TABLET | Freq: Every day | ORAL | Status: DC | PRN

## 2016-02-29 MED ORDER — PANTOPRAZOLE SODIUM 40 MG PO TBEC
40.0000 mg | DELAYED_RELEASE_TABLET | Freq: Every day | ORAL | Status: DC
  Administered 2016-03-02 – 2016-03-03 (×2): 40 mg via ORAL
  Filled 2016-02-29 (×2): qty 1

## 2016-02-29 MED ORDER — MORPHINE SULFATE (PF) 4 MG/ML IV SOLN
4.0000 mg | Freq: Once | INTRAVENOUS | Status: AC
  Administered 2016-02-29: 4 mg via INTRAVENOUS

## 2016-02-29 MED ORDER — INSULIN DETEMIR 100 UNIT/ML ~~LOC~~ SOLN
18.0000 [IU] | Freq: Every day | SUBCUTANEOUS | Status: DC
  Administered 2016-03-01: 18 [IU] via SUBCUTANEOUS
  Filled 2016-02-29 (×3): qty 0.18

## 2016-02-29 MED ORDER — ALBUTEROL SULFATE (2.5 MG/3ML) 0.083% IN NEBU: 2.5000 mg | INHALATION_SOLUTION | RESPIRATORY_TRACT | Status: DC | PRN

## 2016-02-29 MED ORDER — METHOCARBAMOL 1000 MG/10ML IJ SOLN
500.0000 mg | Freq: Four times a day (QID) | INTRAVENOUS | Status: DC | PRN
  Filled 2016-02-29: qty 5

## 2016-02-29 MED ORDER — ACETAMINOPHEN 650 MG RE SUPP: 650.0000 mg | Freq: Four times a day (QID) | RECTAL | Status: DC | PRN

## 2016-02-29 MED ORDER — AMLODIPINE BESYLATE 10 MG PO TABS
10.0000 mg | ORAL_TABLET | Freq: Every day | ORAL | Status: DC
  Administered 2016-03-02 – 2016-03-03 (×2): 10 mg via ORAL
  Filled 2016-02-29 (×2): qty 1

## 2016-02-29 MED ORDER — MORPHINE SULFATE (PF) 4 MG/ML IV SOLN
INTRAVENOUS | Status: AC
  Administered 2016-02-29: 4 mg via INTRAVENOUS
  Filled 2016-02-29: qty 1

## 2016-02-29 MED ORDER — ONDANSETRON HCL 4 MG/2ML IJ SOLN
4.0000 mg | Freq: Once | INTRAMUSCULAR | Status: AC
  Administered 2016-02-29: 4 mg via INTRAVENOUS
  Filled 2016-02-29: qty 2

## 2016-02-29 MED ORDER — KETOROLAC TROMETHAMINE 15 MG/ML IJ SOLN: 15.0000 mg | Freq: Four times a day (QID) | INTRAMUSCULAR | Status: DC | PRN

## 2016-02-29 MED ORDER — METOPROLOL TARTRATE 50 MG PO TABS: 50.0000 mg | ORAL_TABLET | Freq: Two times a day (BID) | ORAL | Status: DC

## 2016-02-29 MED ORDER — METOPROLOL TARTRATE 50 MG PO TABS
50.0000 mg | ORAL_TABLET | Freq: Two times a day (BID) | ORAL | Status: DC
  Administered 2016-02-29 – 2016-03-03 (×7): 50 mg via ORAL
  Filled 2016-02-29 (×7): qty 1

## 2016-02-29 MED ORDER — ATORVASTATIN CALCIUM 20 MG PO TABS: 40.0000 mg | ORAL_TABLET | Freq: Every day | ORAL | Status: DC

## 2016-02-29 MED ORDER — INSULIN ASPART 100 UNIT/ML ~~LOC~~ SOLN
0.0000 [IU] | Freq: Three times a day (TID) | SUBCUTANEOUS | Status: DC
  Administered 2016-02-29: 3 [IU] via SUBCUTANEOUS
  Administered 2016-02-29: 2 [IU] via SUBCUTANEOUS
  Administered 2016-03-01: 3 [IU] via SUBCUTANEOUS
  Administered 2016-03-01: 7 [IU] via SUBCUTANEOUS
  Filled 2016-02-29: qty 2
  Filled 2016-02-29: qty 3
  Filled 2016-02-29: qty 7
  Filled 2016-02-29: qty 3

## 2016-02-29 MED ORDER — ONDANSETRON HCL 4 MG/2ML IJ SOLN: 4.0000 mg | Freq: Four times a day (QID) | INTRAMUSCULAR | Status: DC | PRN

## 2016-02-29 NOTE — ED Provider Notes (Signed)
[facility] Emergency Department Provider Note  ____________________________________________  Time seen: Approximately 6:37 AM  I have reviewed the triage vital signs and the nursing notes.   HISTORY  Chief Complaint Hip Injury    HPI Danielle Boyer is a 75 y.o. female who complains of left hip pain after a fall at home. She was walking to the bathroom this morning when she tripped and fell onto her left side. She reports that "everything hit the floor", but no loss of consciousness headache or neck pain. She lay on the floor for about an hour waiting for first responders to come. No chest pain shortness of breath or abdominal pain. No paresthesia or numbness in the leg.     Past Medical History:  Diagnosis Date  . Diabetes mellitus without complication (HCC)   . Hypertension      Patient Active Problem List   Diagnosis Date Noted  . 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 Essential hypertension 06/07/2015  . Hearing loss 06/07/2015  . Hypothyroidism 06/07/2015  . Cardiovascular disease 06/07/2015  . Atrial fibrillation (HCC) 06/07/2015  . GERD (gastroesophageal reflux disease) 06/07/2015  . IBS (irritable bowel syndrome) 06/07/2015  . Osteoarthritis 06/07/2015  . Paranoid schizophrenia (HCC)   . Schizophrenia (HCC) 06/06/2015  . Diabetes (HCC) 03/19/2015     Past Surgical History:  Procedure Laterality Date  . ABDOMINAL HYSTERECTOMY    . CESAREAN SECTION     x3     Prior to Admission medications   Medication Sig Start Date End Date Taking? Authorizing Provider  aspirin 81 MG chewable tablet Chew 1 tablet (81 mg total) by mouth daily. 06/11/15   Jimmy Footman, MD  atorvastatin (LIPITOR) 40 MG tablet Take 1 tablet (40 mg total) by mouth daily at 6 PM. 06/11/15   Jimmy Footman, MD  cyanocobalamin 1000 MCG tablet Take 1 tablet (1,000 mcg total) by mouth daily. 06/11/15   Jimmy Footman, MD  furosemide  (LASIX) 20 MG tablet Take 20 mg by mouth daily.    Historical Provider, MD  insulin aspart protamine- aspart (NOVOLOG MIX 70/30) (70-30) 100 UNIT/ML injection Inject 0.12 mLs (12 Units total) into the skin daily with supper. 06/12/15   Jimmy Footman, MD  insulin aspart protamine- aspart (NOVOLOG MIX 70/30) (70-30) 100 UNIT/ML injection Inject 0.22 mLs (22 Units total) into the skin daily with breakfast. 06/13/15   Jimmy Footman, MD  levothyroxine (SYNTHROID, LEVOTHROID) 50 MCG tablet Take 1 tablet (50 mcg total) by mouth daily before breakfast. 06/11/15   Jimmy Footman, MD  metFORMIN (GLUCOPHAGE) 1000 MG tablet Take 1,000 mg by mouth 2 (two) times daily.    Historical Provider, MD  metoprolol (LOPRESSOR) 50 MG tablet Take 50 mg by mouth 2 (two) times daily.    Historical Provider, MD  nitroGLYCERIN (NITROSTAT) 0.4 MG SL tablet Place 0.4 mg under the tongue every 5 (five) minutes as needed for chest pain.    Historical Provider, MD  OLANZapine (ZYPREXA) 10 MG tablet Take 1 tablet (10 mg total) by mouth at bedtime. 06/04/15   Audery Amel, MD  omeprazole (PRILOSEC) 20 MG capsule Take 20 mg by mouth 2 (two) times daily.    Historical Provider, MD  potassium chloride SA (K-DUR,KLOR-CON) 20 MEQ tablet Take 20 mEq by mouth daily.    Historical Provider, MD  ranolazine (RANEXA) 500 MG 12 hr tablet Take 500 mg by mouth 2 (two) times daily.    Historical Provider, MD  traZODone (DESYREL) 100 MG tablet Take  100 mg by mouth at bedtime.    Historical Provider, MD     Allergies Citalopram and Penicillins   Family History  Problem Relation Age of Onset  . Breast cancer Sister     Social History Social History  Substance Use Topics  . Smoking status: Never Smoker  . Smokeless tobacco: Never Used  . Alcohol use No    Review of Systems  Constitutional:   No fever or chills.   Cardiovascular:   No chest pain. Respiratory:   No dyspnea or cough. Gastrointestinal:    Negative for abdominal pain, vomiting and diarrhea.   Musculoskeletal:   Left hip pain Neurological:   Negative for headaches 10-point ROS otherwise negative.  ____________________________________________   PHYSICAL EXAM:  VITAL SIGNS: ED Triage Vitals  Enc Vitals Group     BP 02/29/16 0619 (!) 149/58     Pulse Rate 02/29/16 0619 74     Resp 02/29/16 0619 10     Temp 02/29/16 0619 98.4 F (36.9 C)     Temp Source 02/29/16 0619 Oral     SpO2 02/29/16 0619 98 %     Weight 02/29/16 0620 158 lb (71.7 kg)     Height 02/29/16 0620 5\' 4"  (1.626 m)     Head Circumference --      Peak Flow --      Pain Score --      Pain Loc --      Pain Edu? --      Excl. in GC? --     Vital signs reviewed, nursing assessments reviewed.   Constitutional:   Alert and oriented. Well appearing and in no distress. Eyes:   No scleral icterus. No conjunctival pallor.  EOMI.  Marland Kitchen. ENT   Head:   Normocephalic and atraumatic.   Nose:   No congestion/rhinnorhea. No septal hematoma   Mouth/Throat:   MMM, no pharyngeal erythema. No peritonsillar mass.    Neck:   No stridor. No SubQ emphysema. No meningismus. Hematological/Lymphatic/Immunilogical:   No cervical lymphadenopathy. Cardiovascular:   RRR. Symmetric bilateral radial and DP pulses.  No murmurs.  Respiratory:   Normal respiratory effort without tachypnea nor retractions. Breath sounds are clear and equal bilaterally. No wheezes/rales/rhonchi. Gastrointestinal:   Soft and nontender. Non distended. There is no CVA tenderness.  No rebound, rigidity, or guarding. Genitourinary:   deferred Musculoskeletal:   Shortening of left leg with external rotation. Tenderness over the proximal femur near the femoral neck. Neurologic:   Normal speech and language.  CN 2-10 normal. Motor grossly intact. No gross focal neurologic deficits are appreciated.  Skin:    Skin is warm, dry and intact. No rash noted.  No petechiae, purpura, or  bullae.  ____________________________________________    LABS (pertinent positives/negatives) (all labs ordered are listed, but only abnormal results are displayed) Labs Reviewed  CBC - Abnormal; Notable for the following:       Result Value   RBC 3.55 (*)    Hemoglobin 10.9 (*)    HCT 31.9 (*)    RDW 14.9 (*)    All other components within normal limits  BASIC METABOLIC PANEL - Abnormal; Notable for the following:    Glucose, Bld 203 (*)    Calcium 8.7 (*)    All other components within normal limits  URINALYSIS COMPLETEWITH MICROSCOPIC ([facility] ONLY) - Abnormal; Notable for the following:    Color, Urine YELLOW (*)    APPearance HAZY (*)    Hgb urine dipstick 1+ (*)  Nitrite POSITIVE (*)    Leukocytes, UA 2+ (*)    Bacteria, UA FEW (*)    Squamous Epithelial / LPF 0-5 (*)    All other components within normal limits  URINE CULTURE   ____________________________________________   EKG  Interpreted by me Sinus rhythm rate of 82. Normal intervals. Normal QRS ST segments and T waves. Likely limb lead reversal, unable to interpret axis.  ____________________________________________    RADIOLOGY  Chest x-ray unremarkable X-ray left hip and pelvis reveals subcapital left femoral neck fracture, displaced.  ____________________________________________   PROCEDURES Procedures  ____________________________________________   INITIAL IMPRESSION / ASSESSMENT AND PLAN / ED COURSE  Pertinent labs & imaging results that were available during my care of the patient were reviewed by me and considered in my medical decision making (see chart for details).  Patient presents with clinically apparent hip fracture. We'll get x-ray and labs, plan to discuss with orthopedics and admit.    ----------------------------------------- 7:16 AM on 02/29/2016 -----------------------------------------  X-rays reviewed. X-ray left hip confirms fracture. Orthopedics page. Plan to  admit to hospitalist.   Clinical Course  Comment By Time  Discussed with orthopedics Dr. Martha Clan. Will evaluate. Sharman Cheek, MD 08/26 [phone]    ----------------------------------------- 7:28 AM on 02/29/2016 -----------------------------------------  Discussed with hospitalist ____________________________________________   FINAL CLINICAL IMPRESSION(S) / ED DIAGNOSES  Final diagnoses:  Closed fracture of neck of left femur, initial encounter [facility])  UTI (lower urinary tract infection)       Portions of this note were generated with dragon dictation software. Dictation errors may occur despite best attempts at proofreading.    Sharman Cheek, MD 02/29/16 [phone]

## 2016-02-29 NOTE — ED Triage Notes (Signed)
Pt to rm 16 via EMS from [facility] for unwitnessed fall on the way to the bathroom.  Pt c/o lower back and left hip pain.  PT very hard of hearing and difficult to communicate.  Shortening and rotation noted to left leg.  Sensation and motor intact, pedal pulse strong.  Pt NAD at this time.

## 2016-02-29 NOTE — Clinical Social Work Note (Signed)
CSW attempted to contact all family members and ALF to conduct assessment with no answer. Voicemail left for family members. CSW will con't to pursue.  Argentina PonderKaren Martha Karnisha Lefebre, MSW, LCSW-A [phone]

## 2016-02-29 NOTE — Consult Note (Signed)
ORTHOPAEDIC CONSULTATION  REQUESTING PHYSICIAN: Shaune PollackQing Chen, MD  Chief Complaint: Left hip pain status post fall  HPI: Danielle Boyer is a 75 y.o. female who complains of  left hip pain status post fall at her nursing facility. Patient has dementia and is unable to provide a history, but her daughter and son-in-law are at the bedside. Patient has no complaints. She is confused.  Past Medical History:  Diagnosis Date  . CAD (coronary artery disease)   . Diabetes mellitus without complication (HCC)   . Hip fracture (HCC)   . Hypertension    Past Surgical History:  Procedure Laterality Date  . ABDOMINAL HYSTERECTOMY    . CESAREAN SECTION     x3   Social History   Social History  . Marital status: Widowed    Spouse name: N/A  . Number of children: N/A  . Years of education: N/A   Social History Main Topics  . Smoking status: Never Smoker  . Smokeless tobacco: Never Used  . Alcohol use No  . Drug use: No  . Sexual activity: Not Asked   Other Topics Concern  . None   Social History Narrative  . None   Family History  Problem Relation Age of Onset  . Breast cancer Sister    Allergies  Allergen Reactions  . Citalopram Other (See Comments)    Reaction:  Unknown   . Penicillins Rash and Other (See Comments)    Unable to obtain enough information to answer additional questions about this medication.  Has patient had a PCN reaction causing immediate rash, facial/tongue/throat swelling, SOB or lightheadedness with hypotension: Yes Has patient had a PCN reaction causing severe rash involving mucus membranes or skin necrosis: No Has patient had a PCN reaction that required hospitalization: unknown Has patient had a PCN reaction occurring within the last 10 years: unknown If all of the above answers are "NO", t   Prior to Admission medications   Medication Sig Start Date End Date Taking? Authorizing Provider  acetaminophen (TYLENOL) 500 MG tablet Take 500 mg by mouth 3  (three) times daily. Take at 8 am, 2 pm, and 6 pm. *Not to exceed 3 gm/24hr*   Yes Historical Provider, MD  amLODipine (NORVASC) 10 MG tablet Take 10 mg by mouth daily.   Yes Historical Provider, MD  aspirin 81 MG chewable tablet Chew 1 tablet (81 mg total) by mouth daily. 06/11/15  Yes Jimmy FootmanAndrea Hernandez-Gonzalez, MD  hydrALAZINE (APRESOLINE) 25 MG tablet Take 25 mg by mouth every 6 (six) hours as needed. For Systolic blood pressure over 190 or diastolic blood pressure over 90.   Yes Historical Provider, MD  insulin detemir (LEVEMIR) 100 UNIT/ML injection Inject 18 Units into the skin daily.   Yes Historical Provider, MD  levothyroxine (SYNTHROID, LEVOTHROID) 50 MCG tablet Take 1 tablet (50 mcg total) by mouth daily before breakfast. 06/11/15  Yes Jimmy FootmanAndrea Hernandez-Gonzalez, MD  LORazepam (ATIVAN) 0.5 MG tablet Take 0.5 mg by mouth 3 (three) times daily as needed for anxiety. And /or agitation.   Yes Historical Provider, MD  Melatonin 3 MG TABS Take 6 mg by mouth at bedtime.   Yes Historical Provider, MD  metFORMIN (GLUCOPHAGE) 500 MG tablet Take 1,000 mg by mouth 2 (two) times daily with a meal.   Yes Historical Provider, MD  metoprolol (LOPRESSOR) 50 MG tablet Take 50 mg by mouth 2 (two) times daily.   Yes Historical Provider, MD  nystatin (NYSTATIN) powder Apply 1 g topically daily as  needed. For rash.   Yes Historical Provider, MD  OLANZapine (ZYPREXA) 10 MG tablet Take 1 tablet (10 mg total) by mouth at bedtime. 06/04/15  Yes Audery Amel, MD  omeprazole (PRILOSEC) 20 MG capsule Take 20 mg by mouth daily.    Yes Historical Provider, MD  traZODone (DESYREL) 100 MG tablet Take 100 mg by mouth at bedtime.   Yes Historical Provider, MD  atorvastatin (LIPITOR) 40 MG tablet Take 1 tablet (40 mg total) by mouth daily at 6 PM. Patient not taking: Reported on 02/29/2016 06/11/15   Jimmy Footman, MD  cyanocobalamin 1000 MCG tablet Take 1 tablet (1,000 mcg total) by mouth daily. Patient not  taking: Reported on 02/29/2016 06/11/15   Jimmy Footman, MD  insulin aspart protamine- aspart (NOVOLOG MIX 70/30) (70-30) 100 UNIT/ML injection Inject 0.12 mLs (12 Units total) into the skin daily with supper. Patient not taking: Reported on 02/29/2016 06/12/15   Jimmy Footman, MD  insulin aspart protamine- aspart (NOVOLOG MIX 70/30) (70-30) 100 UNIT/ML injection Inject 0.22 mLs (22 Units total) into the skin daily with breakfast. Patient not taking: Reported on 02/29/2016 06/13/15   Jimmy Footman, MD   Dg Chest 1 View  Result Date: 02/29/2016 CLINICAL DATA:  Unwitnessed fall today.  Left hip pain. EXAM: CHEST 1 VIEW COMPARISON:  07/22/2015 FINDINGS: Heart and mediastinal contours are within normal limits. No focal opacities or effusions. No acute bony abnormality. IMPRESSION: No active disease. Electronically Signed   By: Charlett Nose M.D.   On: 02/29/2016 07:13   Dg Hip Unilat W Or Wo Pelvis 2-3 Views Left  Result Date: 02/29/2016 CLINICAL DATA:  Unwitnessed fall today.  Left hip pain. EXAM: DG HIP (WITH OR WITHOUT PELVIS) 2-3V LEFT COMPARISON:  None. FINDINGS: There is a left femoral neck fracture with varus angulation and impaction. No subluxation or dislocation. Mild degenerative changes in the hips bilaterally. SI joints are symmetric and unremarkable. IMPRESSION: Left femoral neck fracture with varus angulation and impaction. Electronically Signed   By: Charlett Nose M.D.   On: 02/29/2016 07:13    Positive ROS: All other systems have been reviewed and were otherwise negative with the exception of those mentioned in the HPI and as above.  Physical Exam: General: Alert, no acute distress  MUSCULOSKELETAL: Left lower extremity is shortened and externally rotated. The skin overlying her left hip is intact. There is no significant swelling of the thigh.  Leg and thigh compartments are soft and compressible. She has intact sensation light touch in palpable pedal  pulses. She can flex and extend her toes and dorsiflex and plantar flex her ankle.  Assessment: Left displaced femoral neck hip fracture.  Plan: Patient has a displaced left femoral neck hip fracture. I recommended to the family that we treat her with a left hip hemiarthroplasty. They understand that this is essentially a half of a hip replacement. Patient is moderate to high risk for surgery per the hospitalist assessment based on her age and medical comorbidities. I described the surgery in detail as well as the postoperative course. The patient will likely need a skilled nursing facility stay upon discharge.  I reviewed the risks benefits of surgery. They understand the risks include but are not limited to infection requiring removal of prosthesis, bleeding requiring blood transfusion, nerve or blood vessel injury, fracture, dislocation, leg length discrepancy, change in lower extremity rotation, hardware failure and the need for further surgery. They also understand medical risks include DVT and pulmonary embolism, myocardial infarction, stroke,  pneumonia, respiratory failure and death. The family understood these risks and was proceed with surgery.  I reviewed all laboratories and regular studies in preparation for this case. I signed the left hip with my initials and the word yes according the hospital's correct site of surgery protocol.   Juanell Fairly, MD    02/29/2016 3:41 PM

## 2016-02-29 NOTE — Progress Notes (Signed)
Pt to have surgery tomorrow. Dr Martha Clankrasinski orders hang vitamin k now and re-check inr at 10pm and call md

## 2016-02-29 NOTE — ED Notes (Signed)
Daughter at bedside. Admitting physician at bedside.

## 2016-02-29 NOTE — H&P (Addendum)
[facility] [place] at [facility]   PATIENT NAME: Danielle Boyer    MR#:  [id number]  DATE OF BIRTH:  01/31/1941  DATE OF ADMISSION:  02/29/2016  PRIMARY CARE PHYSICIAN: Marguarite ArbourSPARKS,JEFFREY D, MD   REQUESTING/REFERRING PHYSICIAN: Sharman CheekPhillip Stafford, MD  CHIEF COMPLAINT:   Chief Complaint  Patient presents with  . Hip Injury   Fall in the left hip pain today. HISTORY OF PRESENT ILLNESS:  Danielle Boyer  is a 75 y.o. female with a known history of Hypertension, diabetes, mild dementia and CAD. The patient tripped and fell into her left side while walking to the bathroom this morning. She complained of left hip pain after fall. She she also complained of dizziness but denies any headache, incontinence or loss of consciousness, no numbness or weakness. X-ray show left femur neck fracture.  PAST MEDICAL HISTORY:   Past Medical History:  Diagnosis Date  . Diabetes mellitus without complication (HCC)   . Hypertension     PAST SURGICAL HISTORY:   Past Surgical History:  Procedure Laterality Date  . ABDOMINAL HYSTERECTOMY    . CESAREAN SECTION     x3    SOCIAL HISTORY:   Social History  Substance Use Topics  . Smoking status: Never Smoker  . Smokeless tobacco: Never Used  . Alcohol use No    FAMILY HISTORY:   Family History  Problem Relation Age of Onset  . Breast cancer Sister     DRUG ALLERGIES:   Allergies  Allergen Reactions  . Citalopram Other (See Comments)    Reaction:  Unknown   . Penicillins Rash and Other (See Comments)    Unable to obtain enough information to answer additional questions about this medication.      REVIEW OF SYSTEMS:   Review of Systems  Constitutional: Negative for chills and fever.  HENT: Negative for congestion.   Eyes: Negative for blurred vision and double vision.  Respiratory: Negative for cough, hemoptysis, sputum production, shortness of breath and wheezing.   Cardiovascular: Negative for chest pain, palpitations,  orthopnea and leg swelling.  Gastrointestinal: Negative for abdominal pain, blood in stool, diarrhea, melena, nausea and vomiting.  Genitourinary: Negative for dysuria and urgency.  Musculoskeletal: Positive for falls and joint pain.  Skin: Negative for rash.  Neurological: Positive for dizziness. Negative for tingling, tremors, focal weakness, seizures, loss of consciousness, weakness and headaches.    MEDICATIONS AT HOME:   Prior to Admission medications   Medication Sig Start Date End Date Taking? Authorizing Provider  aspirin 81 MG chewable tablet Chew 1 tablet (81 mg total) by mouth daily. 06/11/15   Jimmy FootmanAndrea Hernandez-Gonzalez, MD  atorvastatin (LIPITOR) 40 MG tablet Take 1 tablet (40 mg total) by mouth daily at 6 PM. 06/11/15   Jimmy FootmanAndrea Hernandez-Gonzalez, MD  cyanocobalamin 1000 MCG tablet Take 1 tablet (1,000 mcg total) by mouth daily. 06/11/15   Jimmy FootmanAndrea Hernandez-Gonzalez, MD  furosemide (LASIX) 20 MG tablet Take 20 mg by mouth daily.    Historical Provider, MD  insulin aspart protamine- aspart (NOVOLOG MIX 70/30) (70-30) 100 UNIT/ML injection Inject 0.12 mLs (12 Units total) into the skin daily with supper. 06/12/15   Jimmy FootmanAndrea Hernandez-Gonzalez, MD  insulin aspart protamine- aspart (NOVOLOG MIX 70/30) (70-30) 100 UNIT/ML injection Inject 0.22 mLs (22 Units total) into the skin daily with breakfast. 06/13/15   Jimmy FootmanAndrea Hernandez-Gonzalez, MD  levothyroxine (SYNTHROID, LEVOTHROID) 50 MCG tablet Take 1 tablet (50 mcg total) by mouth daily before breakfast. 06/11/15   Jimmy FootmanAndrea Hernandez-Gonzalez, MD  metFORMIN (GLUCOPHAGE) 1000 MG tablet Take 1,000 mg by mouth 2 (two) times daily.    Historical Provider, MD  metoprolol (LOPRESSOR) 50 MG tablet Take 50 mg by mouth 2 (two) times daily.    Historical Provider, MD  nitroGLYCERIN (NITROSTAT) 0.4 MG SL tablet Place 0.4 mg under the tongue every 5 (five) minutes as needed for chest pain.    Historical Provider, MD  OLANZapine (ZYPREXA) 10 MG tablet Take 1  tablet (10 mg total) by mouth at bedtime. 06/04/15   Audery Amel, MD  omeprazole (PRILOSEC) 20 MG capsule Take 20 mg by mouth 2 (two) times daily.    Historical Provider, MD  potassium chloride SA (K-DUR,KLOR-CON) 20 MEQ tablet Take 20 mEq by mouth daily.    Historical Provider, MD  ranolazine (RANEXA) 500 MG 12 hr tablet Take 500 mg by mouth 2 (two) times daily.    Historical Provider, MD  traZODone (DESYREL) 100 MG tablet Take 100 mg by mouth at bedtime.    Historical Provider, MD      VITAL SIGNS:  Blood pressure (!) 136/55, pulse 67, temperature 98.4 F (36.9 C), temperature source Oral, resp. rate 18, height 5\' 4"  (1.626 m), weight 158 lb (71.7 kg), SpO2 97 %.  PHYSICAL EXAMINATION:  Physical Exam  GENERAL:  75 y.o.-year-old patient lying in the bed with no acute distress.  EYES: Pupils equal, round, reactive to light and accommodation. No scleral icterus. Extraocular muscles intact.  HEENT: Head atraumatic, normocephalic. Oropharynx and nasopharynx clear.  NECK:  Supple, no jugular venous distention. No thyroid enlargement, no tenderness.  LUNGS: Normal breath sounds bilaterally, no wheezing, rales,rhonchi or crepitation. No use of accessory muscles of respiration.  CARDIOVASCULAR: S1, S2 normal. No murmurs, rubs, or gallops.  ABDOMEN: Soft, nontender, nondistended. Bowel sounds present. No organomegaly or mass.  EXTREMITIES: No pedal edema, cyanosis, or clubbing. Left rotation deformity of Left leg. NEUROLOGIC: Cranial nerves II through XII are intact. Muscle strength 5/5 in all extremities except the left leg. Sensation intact. Gait not checked.  PSYCHIATRIC: The patient is alert and oriented x 3.  SKIN: No obvious rash, lesion, or ulcer.   LABORATORY PANEL:   CBC  Recent Labs Lab 02/29/16 0628  WBC 8.5  HGB 10.9*  HCT 31.9*  PLT 234   ------------------------------------------------------------------------------------------------------------------  Chemistries    Recent Labs Lab 02/29/16 0628  NA 140  K 4.0  CL 107  CO2 25  GLUCOSE 203*  BUN 14  CREATININE 0.61  CALCIUM 8.7*   ------------------------------------------------------------------------------------------------------------------  Cardiac Enzymes No results for input(s): TROPONINI in the last 168 hours. ------------------------------------------------------------------------------------------------------------------  RADIOLOGY:  Dg Chest 1 View  Result Date: 02/29/2016 CLINICAL DATA:  Unwitnessed fall today.  Left hip pain. EXAM: CHEST 1 VIEW COMPARISON:  07/22/2015 FINDINGS: Heart and mediastinal contours are within normal limits. No focal opacities or effusions. No acute bony abnormality. IMPRESSION: No active disease. Electronically Signed   By: Charlett Nose M.D.   On: 02/29/2016 07:13   Dg Hip Unilat W Or Wo Pelvis 2-3 Views Left  Result Date: 02/29/2016 CLINICAL DATA:  Unwitnessed fall today.  Left hip pain. EXAM: DG HIP (WITH OR WITHOUT PELVIS) 2-3V LEFT COMPARISON:  None. FINDINGS: There is a left femoral neck fracture with varus angulation and impaction. No subluxation or dislocation. Mild degenerative changes in the hips bilaterally. SI joints are symmetric and unremarkable. IMPRESSION: Left femoral neck fracture with varus angulation and impaction. Electronically Signed   By: Charlett Nose M.D.   On:  02/29/2016 07:13      IMPRESSION AND PLAN:   Left hip fracture. The patient has hypertension, diabetes and CAD history. She has moderate risk for hip fracture surgery. Follow-up with Dr. Martha Clan for possible hip fracture. Pain control, DVT prophylaxis after surgery. Hold aspirin.  UTI. Continue rocephin and follow up urine culture. CAD. Hold aspirin. Hypertension. Continue hypertension medication Diabetes. Hold metformin, continue NovoLog 7030, start sliding scale. Anemia of chronic disease. Stable.  All the records are reviewed and case discussed with ED  provider. Management plans discussed with the patient, her daughter and they are in agreement.  CODE STATUS: Full code  TOTAL TIME TAKING CARE OF THIS PATIENT: 52 minutes.    Shaune Pollack M.D on 02/29/2016 at 8:18 AM  Between 7am to 6pm - Pager - [phone]  After 6pm go to [email or web address] - Social research officer, government  [facility] [place] [facility]  Office  [phone]  CC: Primary care physician; Marguarite Arbour, MD   Note: This dictation was prepared with Dragon dictation along with smaller phrase technology. Any transcriptional errors that result from this process are unintentional.

## 2016-03-01 ENCOUNTER — Encounter
Admission: EM | Disposition: A | Discharge status: Skilled Nursing Facility | Payer: Self-pay | Source: Home / Self Care | Attending: Internal Medicine

## 2016-03-01 ENCOUNTER — Inpatient Hospital Stay: Payer: Medicare Other

## 2016-03-01 ENCOUNTER — Inpatient Hospital Stay: Payer: Medicare Other | Admitting: Anesthesiology

## 2016-03-01 ENCOUNTER — Encounter: Payer: Self-pay | Admitting: Anesthesiology

## 2016-03-01 HISTORY — PX: HIP ARTHROPLASTY: SHX981

## 2016-03-01 LAB — CBC
HEMATOCRIT: 32 % — AB (ref 35.0–47.0)
HEMATOCRIT: 31.4 % — AB (ref 35.0–47.0)
HEMOGLOBIN: 10.8 g/dL — AB (ref 12.0–16.0)
Hemoglobin: 10.4 g/dL — ABNORMAL LOW (ref 12.0–16.0)
MCH: 29.9 pg (ref 26.0–34.0)
MCH: 29.8 pg (ref 26.0–34.0)
MCHC: 33.7 g/dL (ref 32.0–36.0)
MCHC: 33 g/dL (ref 32.0–36.0)
MCV: 88.8 fL (ref 80.0–100.0)
MCV: 90.2 fL (ref 80.0–100.0)
PLATELETS: 230 10*3/uL (ref 150–440)
Platelets: 243 10*3/uL (ref 150–440)
RBC: 3.61 MIL/uL — ABNORMAL LOW (ref 3.80–5.20)
RBC: 3.48 MIL/uL — ABNORMAL LOW (ref 3.80–5.20)
RDW: 15.1 % — ABNORMAL HIGH (ref 11.5–14.5)
RDW: 15.1 % — AB (ref 11.5–14.5)
WBC: 9.7 10*3/uL (ref 3.6–11.0)
WBC: 13.5 10*3/uL — ABNORMAL HIGH (ref 3.6–11.0)

## 2016-03-01 LAB — BASIC METABOLIC PANEL
ANION GAP: 7 (ref 5–15)
BUN: 15 mg/dL (ref 6–20)
CALCIUM: 8.5 mg/dL — AB (ref 8.9–10.3)
CO2: 26 mmol/L (ref 22–32)
Chloride: 104 mmol/L (ref 101–111)
Creatinine, Ser: 0.66 mg/dL (ref 0.44–1.00)
GFR calc non Af Amer: >60 mL/min (ref >60)
GLUCOSE: 246 mg/dL — AB (ref 65–99)
POTASSIUM: 3.7 mmol/L (ref 3.5–5.1)
Sodium: 137 mmol/L (ref 135–145)

## 2016-03-01 LAB — GLUCOSE, CAPILLARY
Glucose-Capillary: 223 mg/dL — ABNORMAL HIGH (ref 65–99)
Glucose-Capillary: 336 mg/dL — ABNORMAL HIGH (ref 65–99)
Glucose-Capillary: 317 mg/dL — ABNORMAL HIGH (ref 65–99)

## 2016-03-01 SURGERY — HEMIARTHROPLASTY, HIP, DIRECT ANTERIOR APPROACH, FOR FRACTURE
Anesthesia: General | Site: Hip | Laterality: Left | Wound class: Clean

## 2016-03-01 MED ORDER — MORPHINE SULFATE (PF) 2 MG/ML IV SOLN
2.0000 mg | INTRAVENOUS | Status: DC | PRN
  Administered 2016-03-02: 2 mg via INTRAVENOUS
  Filled 2016-03-01: qty 1

## 2016-03-01 MED ORDER — BUPIVACAINE HCL (PF) 0.5 % IJ SOLN
INTRAMUSCULAR | Status: DC | PRN
  Administered 2016-03-01: 30 mL

## 2016-03-01 MED ORDER — ACETAMINOPHEN 500 MG PO TABS
1000.0000 mg | ORAL_TABLET | Freq: Four times a day (QID) | ORAL | Status: AC
  Administered 2016-03-01 – 2016-03-02 (×3): 1000 mg via ORAL
  Filled 2016-03-01 (×4): qty 2

## 2016-03-01 MED ORDER — NEOMYCIN-POLYMYXIN B GU 40-200000 IR SOLN
Status: AC
  Filled 2016-03-01: qty 20

## 2016-03-01 MED ORDER — SENNA 8.6 MG PO TABS
1.0000 | ORAL_TABLET | Freq: Two times a day (BID) | ORAL | Status: DC
  Administered 2016-03-01 – 2016-03-03 (×4): 8.6 mg via ORAL
  Filled 2016-03-01 (×4): qty 1

## 2016-03-01 MED ORDER — ACETAMINOPHEN 10 MG/ML IV SOLN
INTRAVENOUS | Status: DC | PRN
  Administered 2016-03-01: 1000 mg via INTRAVENOUS

## 2016-03-01 MED ORDER — CLINDAMYCIN PHOSPHATE 600 MG/50ML IV SOLN
INTRAVENOUS | Status: AC
  Filled 2016-03-01: qty 50

## 2016-03-01 MED ORDER — FERROUS SULFATE 325 (65 FE) MG PO TABS
325.0000 mg | ORAL_TABLET | Freq: Three times a day (TID) | ORAL | Status: DC
  Administered 2016-03-01 – 2016-03-03 (×5): 325 mg via ORAL
  Filled 2016-03-01 (×5): qty 1

## 2016-03-01 MED ORDER — ALUM & MAG HYDROXIDE-SIMETH 200-200-20 MG/5ML PO SUSP: 30.0000 mL | ORAL | Status: DC | PRN

## 2016-03-01 MED ORDER — DOCUSATE SODIUM 100 MG PO CAPS
100.0000 mg | ORAL_CAPSULE | Freq: Two times a day (BID) | ORAL | Status: DC
  Administered 2016-03-01 – 2016-03-03 (×4): 100 mg via ORAL
  Filled 2016-03-01 (×4): qty 1

## 2016-03-01 MED ORDER — PHENOL 1.4 % MT LIQD
1.0000 | OROMUCOSAL | Status: DC | PRN
  Filled 2016-03-01: qty 177

## 2016-03-01 MED ORDER — FENTANYL CITRATE (PF) 100 MCG/2ML IJ SOLN
25.0000 ug | INTRAMUSCULAR | Status: DC | PRN
  Administered 2016-03-01: 25 ug via INTRAVENOUS

## 2016-03-01 MED ORDER — ACETAMINOPHEN 10 MG/ML IV SOLN
INTRAVENOUS | Status: AC
  Filled 2016-03-01: qty 100

## 2016-03-01 MED ORDER — POLYETHYLENE GLYCOL 3350 17 G PO PACK: 17.0000 g | PACK | Freq: Every day | ORAL | Status: DC | PRN

## 2016-03-01 MED ORDER — MIDAZOLAM HCL 5 MG/5ML IJ SOLN
INTRAMUSCULAR | Status: DC | PRN
  Administered 2016-03-01: 2 mg via INTRAVENOUS

## 2016-03-01 MED ORDER — ENOXAPARIN SODIUM 30 MG/0.3ML ~~LOC~~ SOLN: 30.0000 mg | SUBCUTANEOUS | Status: DC

## 2016-03-01 MED ORDER — PHENYLEPHRINE HCL 10 MG/ML IJ SOLN
INTRAMUSCULAR | Status: DC | PRN
  Administered 2016-03-01 (×7): 100 ug via INTRAVENOUS

## 2016-03-01 MED ORDER — NEOMYCIN-POLYMYXIN B GU 40-200000 IR SOLN
Status: DC | PRN
  Administered 2016-03-01: 16 mL

## 2016-03-01 MED ORDER — FENTANYL CITRATE (PF) 100 MCG/2ML IJ SOLN
INTRAMUSCULAR | Status: AC
  Administered 2016-03-01: 25 ug via INTRAVENOUS
  Filled 2016-03-01: qty 2

## 2016-03-01 MED ORDER — LIDOCAINE HCL (CARDIAC) 20 MG/ML IV SOLN
INTRAVENOUS | Status: DC | PRN
  Administered 2016-03-01: 30 mg via INTRAVENOUS

## 2016-03-01 MED ORDER — CLINDAMYCIN PHOSPHATE 600 MG/50ML IV SOLN
600.0000 mg | Freq: Four times a day (QID) | INTRAVENOUS | Status: AC
  Administered 2016-03-01 – 2016-03-02 (×2): 600 mg via INTRAVENOUS
  Filled 2016-03-01 (×2): qty 50

## 2016-03-01 MED ORDER — LACTATED RINGERS IV SOLN
INTRAVENOUS | Status: DC | PRN
  Administered 2016-03-01 (×2): via INTRAVENOUS

## 2016-03-01 MED ORDER — CLINDAMYCIN PHOSPHATE 600 MG/50ML IV SOLN
INTRAVENOUS | Status: DC | PRN
  Administered 2016-03-01: 600 mg via INTRAVENOUS

## 2016-03-01 MED ORDER — ONDANSETRON HCL 4 MG/2ML IJ SOLN: 4.0000 mg | Freq: Once | INTRAMUSCULAR | Status: DC | PRN

## 2016-03-01 MED ORDER — ENOXAPARIN SODIUM 30 MG/0.3ML ~~LOC~~ SOLN
30.0000 mg | Freq: Two times a day (BID) | SUBCUTANEOUS | Status: DC
  Administered 2016-03-02 – 2016-03-03 (×3): 30 mg via SUBCUTANEOUS
  Filled 2016-03-01 (×3): qty 0.3

## 2016-03-01 MED ORDER — SODIUM CHLORIDE 0.9 % IV SOLN
75.0000 mL/h | INTRAVENOUS | Status: DC
  Administered 2016-03-01 – 2016-03-02 (×2): 75 mL/h via INTRAVENOUS

## 2016-03-01 MED ORDER — MENTHOL 3 MG MT LOZG
1.0000 | LOZENGE | OROMUCOSAL | Status: DC | PRN
  Filled 2016-03-01: qty 9

## 2016-03-01 MED ORDER — PROPOFOL 10 MG/ML IV BOLUS
INTRAVENOUS | Status: DC | PRN
  Administered 2016-03-01: 100 mg via INTRAVENOUS

## 2016-03-01 MED ORDER — ONDANSETRON HCL 4 MG/2ML IJ SOLN
INTRAMUSCULAR | Status: DC | PRN
  Administered 2016-03-01: 4 mg via INTRAVENOUS

## 2016-03-01 MED ORDER — OXYCODONE HCL 5 MG PO TABS: 5.0000 mg | ORAL_TABLET | ORAL | Status: DC | PRN

## 2016-03-01 MED ORDER — FENTANYL CITRATE (PF) 100 MCG/2ML IJ SOLN
INTRAMUSCULAR | Status: DC | PRN
  Administered 2016-03-01 (×3): 50 ug via INTRAVENOUS
  Administered 2016-03-01: 100 ug via INTRAVENOUS

## 2016-03-01 MED ORDER — MAGNESIUM CITRATE PO SOLN
1.0000 | Freq: Once | ORAL | Status: DC | PRN
  Filled 2016-03-01: qty 296

## 2016-03-01 MED ORDER — SUCCINYLCHOLINE CHLORIDE 20 MG/ML IJ SOLN
INTRAMUSCULAR | Status: DC | PRN
  Administered 2016-03-01: 100 mg via INTRAVENOUS

## 2016-03-01 MED ORDER — BUPIVACAINE HCL (PF) 0.5 % IJ SOLN
INTRAMUSCULAR | Status: AC
  Filled 2016-03-01: qty 30

## 2016-03-01 SURGICAL SUPPLY — 48 items
BLADE SAGITTAL WIDE XTHICK NO (BLADE) ×3 IMPLANT
BLADE SURG SZ10 CARB STEEL (BLADE) ×3 IMPLANT
CANISTER SUCT 1200ML W/VALVE (MISCELLANEOUS) ×3 IMPLANT
CANISTER SUCT 3000ML PPV (MISCELLANEOUS) ×6 IMPLANT
CAPT HIP HEMI 1 ×3 IMPLANT
DRAPE IMP U-DRAPE 54X76 (DRAPES) ×3 IMPLANT
DRAPE INCISE IOBAN 66X60 STRL (DRAPES) ×3 IMPLANT
DRAPE SHEET LG 3/4 BI-LAMINATE (DRAPES) ×6 IMPLANT
DRAPE SURG 17X11 SM STRL (DRAPES) ×3 IMPLANT
DRAPE TABLE BACK 80X90 (DRAPES) ×3 IMPLANT
DRSG OPSITE POSTOP 4X10 (GAUZE/BANDAGES/DRESSINGS) ×3 IMPLANT
DURAPREP 26ML APPLICATOR (WOUND CARE) ×12 IMPLANT
ELECT BLADE 6.5 EXT (BLADE) ×3 IMPLANT
ELECT CAUTERY BLADE 6.4 (BLADE) ×3 IMPLANT
ELECT REM PT RETURN 9FT ADLT (ELECTROSURGICAL) ×3
ELECTRODE REM PT RTRN 9FT ADLT (ELECTROSURGICAL) ×1 IMPLANT
GAUZE PETRO XEROFOAM 1X8 (MISCELLANEOUS) ×6 IMPLANT
GAUZE SPONGE 4X4 12PLY STRL (GAUZE/BANDAGES/DRESSINGS) ×3 IMPLANT
GLOVE BIOGEL PI IND STRL 9 (GLOVE) ×1 IMPLANT
GLOVE BIOGEL PI INDICATOR 9 (GLOVE) ×2
GLOVE SURG 9.0 ORTHO LTXF (GLOVE) ×3 IMPLANT
GOWN STRL REUS TWL 2XL XL LVL4 (GOWN DISPOSABLE) ×3 IMPLANT
GOWN STRL REUS W/ TWL LRG LVL3 (GOWN DISPOSABLE) ×1 IMPLANT
GOWN STRL REUS W/TWL LRG LVL3 (GOWN DISPOSABLE) ×2
HANDPIECE INTERPULSE COAX TIP (DISPOSABLE) ×2
HEMOVAC 400ML (MISCELLANEOUS)
HIP CAPITATED HEMI 1 ×1 IMPLANT
KIT DRAIN HEMOVAC JP 7FR 400ML (MISCELLANEOUS) IMPLANT
KIT RM TURNOVER STRD PROC AR (KITS) ×3 IMPLANT
NDL SAFETY 18GX1.5 (NEEDLE) ×3 IMPLANT
NEEDLE FILTER BLUNT 18X 1/2SAF (NEEDLE) ×2
NEEDLE FILTER BLUNT 18X1 1/2 (NEEDLE) ×1 IMPLANT
NEEDLE MAYO CATGUT SZ4 (NEEDLE) ×3 IMPLANT
NS IRRIG 1000ML POUR BTL (IV SOLUTION) ×3 IMPLANT
PACK HIP PROSTHESIS (MISCELLANEOUS) ×3 IMPLANT
PILLOW ABDUC SM (MISCELLANEOUS) ×3 IMPLANT
RETRIEVER SUT HEWSON (MISCELLANEOUS) IMPLANT
SET HNDPC FAN SPRY TIP SCT (DISPOSABLE) ×1 IMPLANT
SOL .9 NS 3000ML IRR  AL (IV SOLUTION) ×2
SOL .9 NS 3000ML IRR UROMATIC (IV SOLUTION) ×1 IMPLANT
STAPLER SKIN PROX 35W (STAPLE) ×3 IMPLANT
SUT MNCRL 3 0 RB1 (SUTURE) ×1 IMPLANT
SUT MONOCRYL 3 0 RB1 (SUTURE) ×2
SUT TICRON 2-0 30IN 311381 (SUTURE) ×12 IMPLANT
SUT VIC AB 0 CT1 36 (SUTURE) ×6 IMPLANT
SUT VIC AB 2-0 CT2 27 (SUTURE) ×6 IMPLANT
SYRINGE 10CC LL (SYRINGE) ×3 IMPLANT
TAPE MICROFOAM 4IN (TAPE) ×3 IMPLANT

## 2016-03-01 NOTE — Transfer of Care (Signed)
Immediate Anesthesia Transfer of Care Note  Patient: Danielle Boyer  Procedure(s) Performed: Procedure(s): ARTHROPLASTY BIPOLAR HIP (HEMIARTHROPLASTY) (Left)  Patient Location: PACU  Anesthesia Type:General  Level of Consciousness: awake, alert  and oriented  Airway & Oxygen Therapy: Patient Spontanous Breathing and Patient connected to face mask oxygen  Post-op Assessment: Report given to RN and Post -op Vital signs reviewed and stable  Post vital signs: Reviewed and stable  Last Vitals:  Vitals:   03/01/16 0732 03/01/16 1330  BP: (!) 176/72   Pulse: 98   Resp: 18   Temp: 37.4 C (!) (P) 36 C    Last Pain:  Vitals:   03/01/16 0732  TempSrc: Oral  PainSc:          Complications: No apparent anesthesia complications

## 2016-03-01 NOTE — Anesthesia Procedure Notes (Signed)
Procedure Name: Intubation Performed by: Chyler Creely Pre-anesthesia Checklist: Patient identified, Patient being monitored, Timeout performed, Emergency Drugs available and Suction available Patient Re-evaluated:Patient Re-evaluated prior to inductionOxygen Delivery Method: Circle system utilized Preoxygenation: Pre-oxygenation with 100% oxygen Intubation Type: IV induction Ventilation: Mask ventilation without difficulty Laryngoscope Size: Mac and 3 Grade View: Grade I Tube type: Oral Tube size: 7.0 mm Number of attempts: 1 Airway Equipment and Method: Stylet Placement Confirmation: ETT inserted through vocal cords under direct vision,  positive ETCO2 and breath sounds checked- equal and bilateral Secured at: 21 cm Tube secured with: Tape Dental Injury: Teeth and Oropharynx as per pre-operative assessment        

## 2016-03-01 NOTE — Op Note (Addendum)
02/29/2016 - 03/01/2016  2:00 PM  PATIENT:  Danielle Boyer   MRN: [id number]  PRE-OPERATIVE DIAGNOSIS:  left femoral neck hip fracture  POST-OPERATIVE DIAGNOSIS:  left femoral neck hip fracture  PROCEDURE:  Procedure(s): LEFT HIP HEMIARTHROPLASTY  PREOPERATIVE INDICATIONS:    Danielle Boyer is an 75 y.o. female who was admitted with a diagnosis of a displaced left femoral neck hip fracture.  I have recommended surgical fixation with hemiarthroplasty for this injury. I have explained the surgery and the postoperative course to the patient and their daughter who have agreed with surgical management of this fracture.    The risks benefits and alternatives were discussed with the patient and their family including but not limited to the risks of  infection requiring removal of the prosthesis, bleeding requiring blood transfusion, nerve injury especially to the sciatic nerve leading to foot drop or lower extremity numbness, periprosthetic fracture, dislocation leg length discrepancy, change in lower extremity rotation persistent hip pain, loosening or failure of the components and the need for revision surgery. Medical risks include but are not limited to DVT and pulmonary embolism, myocardial infarction, stroke, pneumonia, respiratory failure and death.  OPERATIVE REPORT     SURGEON:  Thornton Park, MD    ASSISTANT:  Surgical tech    ANESTHESIA:  General and local with 0.5% marcaine plain    COMPLICATIONS:  Nondisplaced fracture of the posterior calcar fixed with a cable.   SPECIMEN: Femoral head to pathology    COMPONENTS:  Stryker Accolade HFx femoral component size 3 with a 47 mm Stryker Unitrax head +4 neck adjustment sleeve.  Stryker cable system for calcar fracture reinforcement.    PROCEDURE IN DETAIL:   The patient was met in the holding area and  identified.  The appropriate hip was identified and marked at the operative site after verbally confirming with the patient that this was  the correct site of surgery.  The patient was then transported to the OR  and  underwent general anesthesia.  The patient was then placed in the lateral decubitus position with the operative side up and secured on the operating room table with a pegboard and all bony prominences were adequately padded. This included an axillary roll and additional padding around the nonoperative leg to prevent compression to the common peroneal nerve.    The operative lower extremity was prepped and draped in a sterile fashion.  A time out was performed prior to incision to verify patient's name, date of birth, medical record number, correct site of surgery correct procedure to be performed. The timeout was also used to verify the patient received antibiotics now appropriate instruments, implants and radiographic studies were available in the room. Once all in attendance were in agreement case began.    A posterolateral approach was utilized via sharp dissection  carried down to the subcutaneous tissue.  Bleeding vessels were coagulated using electrocautery.  The fascia lata was identified and incised along the length of the skin incision.  The gluteus maximus muscle was then split in line with its fibers. Self-retaining retractors were  inserted.  With the hip internally rotated, the short external rotators  were identified and removed from the posterior attachment from the greater trochanter. The capsule was identified and a T-shaped capsulotomy was performed. The capsule was tagged with #2 Tycron for later repair.  The femoral neck fracture was exposed, and the femoral head was removed using a corkscrew device. This was measured to be 47 mm in diameter.  The attention was then turned to proximal femur preparation.  An oscillating saw was used to perform a proximal femoral osteotomy 1 fingerbreadth above the lesser trochanter. The trial 47 mm femoral head was placed into the acetabulum and had an excellent suction fit. The  attention was then turned back to femoral preparation.    A femoral skid and Cobra retractor were placed under the femoral neck to allow for adequate visualization. A box osteotome was used to make the initial entry into the proximal femur. A single hand reamer was used to prepare the femoral canal. A T-shaped femoral canal sounder was then used to ensure no penetration femoral cortex had occurred during reaming. The proximal femur was then sequentially broached by hand. A size 3 femoral trial was found to have best medial to lateral canal fit. Once adequate mediolateral canal fill was achieved the trial femoral broach, neck, and head was assembled and the hip was reduced. It was found to have excellent stability, equivalent leg lengths with functional range of motion. Upon inspection of the femoral neck with the trial components in place, it was noted the patient had a small vertical, nondisplaced fracture of the calcar posteriorly. The decision was made to place a table around the proximal femur to prevent propagation of this fracture and to ensure stability to the prosthesis. The Stryker cable set was used. A cable hook was placed, around the proximal femur. The wire was fed through this cable hook. The cable hook allowed for passage of the cable around the proximal femur. The cable was placed through the crimping device and tightened the rod and the proximal femur. This was then tensioned with a tensioning device. The wires were then crimped. The excess cable was cut.  The trial components were then removed.  I copiously irrigated the femoral canal and then impacted the real femoral size 3 Stryker HFx prosthesis into place into the appropriate version,approximately 15 degrees anteverted to the normal anatomy, and I impacted the actual 47 mm Unitrax femoral component with a +4 neck adjustment sleeve into place. The hip was then reduced and taken through functional range of motion and found to have excellent  stability. Leg lengths were restored. The hip joint was copiously irrigated.   A soft tissue repair of the capsule and external rotators was performed using #2 Tycron Excellent posterior capsular repair was achieved. The fascia lata was then closed with interrupted 0 Vicryl suture. The subcutaneous tissues were closed with 2-0 Vicryl and the skin approximated with staples.   The patient was then placed supine on the operative table. Leg lengths were checked clinically and found to be equivalent. An abduction pillow was placed between the lower extremities. The patient was then transferred to a hospital bed and brought to the PACU in stable condition. I was scrubbed and present the entire case and all sharp and instrument counts were correct at the conclusion of the case. I spoke with the patient's daughter in the operative waiting room and her son by phone to let them know case was completed without complication patient was stable in recovery room.   Timoteo Gaul, MD Orthopedic Surgeon

## 2016-03-01 NOTE — Progress Notes (Signed)
[facility] - [place] at [facility]   PATIENT NAME: Danielle Boyer    MR#:  [id number]  DATE OF BIRTH:  03/21/1941  SUBJECTIVE:  CHIEF COMPLAINT:   Chief Complaint  Patient presents with  . Hip Injury   Left hip pain. REVIEW OF SYSTEMS:  Review of Systems  Constitutional: Negative for chills, fever and malaise/fatigue.  HENT: Negative for congestion and sore throat.   Eyes: Negative for blurred vision and double vision.  Respiratory: Negative for cough, sputum production and shortness of breath.   Cardiovascular: Negative for chest pain, palpitations, orthopnea and leg swelling.  Gastrointestinal: Negative for abdominal pain, blood in stool, diarrhea, melena, nausea and vomiting.  Genitourinary: Negative for dysuria and urgency.  Musculoskeletal: Positive for joint pain.  Skin: Negative for rash.  Neurological: Negative for dizziness, tingling, focal weakness and headaches.  Psychiatric/Behavioral: Negative for depression.    DRUG ALLERGIES:   Allergies  Allergen Reactions  . Citalopram Other (See Comments)    Reaction:  Unknown   . Penicillins Rash and Other (See Comments)    Unable to obtain enough information to answer additional questions about this medication.  Has patient had a PCN reaction causing immediate rash, facial/tongue/throat swelling, SOB or lightheadedness with hypotension: Yes Has patient had a PCN reaction causing severe rash involving mucus membranes or skin necrosis: No Has patient had a PCN reaction that required hospitalization: unknown Has patient had a PCN reaction occurring within the last 10 years: unknown If all of the above answers are "NO", t   VITALS:  Blood pressure (!) 176/72, pulse 98, temperature 99.3 F (37.4 C), temperature source Oral, resp. rate 18, height 5\' 4"  (1.626 m), weight 149 lb 4.8 oz (67.7 kg), SpO2 95 %. PHYSICAL EXAMINATION:  Physical Exam  Constitutional: She is well-developed, well-nourished, and in no  distress.  HENT:  Head: Normocephalic and atraumatic.  Mouth/Throat: Oropharynx is clear and moist.  Eyes: Conjunctivae and EOM are normal. Pupils are equal, round, and reactive to light. No scleral icterus.  Neck: Normal range of motion. Neck supple. No JVD present. No tracheal deviation present. No thyromegaly present.  Cardiovascular: Normal rate, regular rhythm and normal heart sounds.  Exam reveals no gallop.   No murmur heard. Pulmonary/Chest: Effort normal and breath sounds normal. No respiratory distress. She has no wheezes. She has no rales.  Abdominal: Soft. Bowel sounds are normal. She exhibits no distension. There is no tenderness.  Musculoskeletal: She exhibits no edema.  Left leg short and left rotation deformity.  Lymphadenopathy:    She has no cervical adenopathy.  Neurological: She is alert. No cranial nerve deficit.  Demented.  Skin: Skin is warm.   LABORATORY PANEL:   CBC  Recent Labs Lab 03/01/16 0345  WBC 9.7  HGB 10.8*  HCT 32.0*  PLT 243   ------------------------------------------------------------------------------------------------------------------ Chemistries   Recent Labs Lab 03/01/16 0345  NA 137  K 3.7  CL 104  CO2 26  GLUCOSE 246*  BUN 15  CREATININE 0.66  CALCIUM 8.5*   RADIOLOGY:  No results found. ASSESSMENT AND PLAN:   Left hip fracture. The patient has hypertension, diabetes and CAD history. She has moderate risk for hip fracture surgery. Follow-up with Dr. Martha ClanKrasinski for hip surgery today. Pain control, DVT prophylaxis after surgery. Hold aspirin.  UTI. Continue rocephin and follow up urine culture. CAD. Hold aspirin. Hypertension. Continue hypertension medication Diabetes. Hold metformin, continue levemir and sliding scale. Anemia of chronic disease. Stable.  All the records  are reviewed and case discussed with Care Management/Social Worker. Management plans discussed with the patient, her daughter and they are in  agreement.  CODE STATUS: full code.  TOTAL TIME TAKING CARE OF THIS PATIENT: 33 minutes.   More than 50% of the time was spent in counseling/coordination of care: YES  POSSIBLE D/C IN 3 DAYS, DEPENDING ON CLINICAL CONDITION.   Shaune Pollack M.D on 03/01/2016 at 11:25 AM  Between 7am to 6pm - Pager - [phone]  After 6pm go to [email or web address] - Social research officer, government  [facility] [place] [facility]  Office  [phone]  CC: Primary care physician; Marguarite Arbour, MD  Note: This dictation was prepared with Dragon dictation along with smaller phrase technology. Any transcriptional errors that result from this process are unintentional.

## 2016-03-01 NOTE — OR Nursing (Signed)
Xray completed in PACU

## 2016-03-01 NOTE — Progress Notes (Signed)
Subjective:  POST OP CHECK:   Status post left hip hemiarthroplasty.  Patient confused but seems comfortable. She does not comment about having pain in her left hip.   Objective:   VITALS:   Vitals:   03/01/16 1508 03/01/16 1522 03/01/16 1615 03/01/16 1706  BP: (!) 113/45 (!) 125/48 131/60 (!) 129/53  Pulse: 73 78 83 84  Resp: 17 18 18 18   Temp: 98.3 F (36.8 C) 98.2 F (36.8 C) 97.8 F (36.6 C)   TempSrc: Oral Oral Oral   SpO2: 98% 99% 94% 100%  Weight:      Height:        PHYSICAL EXAM:  Left hip: Patient with minimal symptoms drainage on her honeycomb dressing. Her thigh compartments remain soft and compressible. There is no significant swelling. There is no erythema or ecchymosis. She has spontaneous movement of her toes. She is dorsiflex and plantar flexing her toes. She has palpable pedal pulses. It is difficult to assess her sensation based on her confusion. Patient was not able to cooperate much with the exam.   LABS  Results for orders placed or performed during the hospital encounter of 02/29/16 (from the past 24 hour(s))  Glucose, capillary     Status: Abnormal   Collection Time: 02/29/16  9:47 PM  Result Value Ref Range   Glucose-Capillary 254 (H) 65 - 99 mg/dL   Comment 1 Notify RN   CBC     Status: Abnormal   Collection Time: 03/01/16  3:45 AM  Result Value Ref Range   WBC 9.7 3.6 - 11.0 K/uL   RBC 3.61 (L) 3.80 - 5.20 MIL/uL   Hemoglobin 10.8 (L) 12.0 - 16.0 g/dL   HCT 11.932.0 (L) 14.735.0 - 82.947.0 %   MCV 88.8 80.0 - 100.0 fL   MCH 29.9 26.0 - 34.0 pg   MCHC 33.7 32.0 - 36.0 g/dL   RDW 56.215.1 (H) 13.011.5 - 86.514.5 %   Platelets 243 150 - 440 K/uL  Basic metabolic panel     Status: Abnormal   Collection Time: 03/01/16  3:45 AM  Result Value Ref Range   Sodium 137 135 - 145 mmol/L   Potassium 3.7 3.5 - 5.1 mmol/L   Chloride 104 101 - 111 mmol/L   CO2 26 22 - 32 mmol/L   Glucose, Bld 246 (H) 65 - 99 mg/dL   BUN 15 6 - 20 mg/dL   Creatinine, Ser 7.840.66 0.44 - 1.00  mg/dL   Calcium 8.5 (L) 8.9 - 10.3 mg/dL   GFR calc non Af Amer >60 >60 mL/min   GFR calc Af Amer >60 >60 mL/min   Anion gap 7 5 - 15  Glucose, capillary     Status: Abnormal   Collection Time: 03/01/16  7:43 AM  Result Value Ref Range   Glucose-Capillary 223 (H) 65 - 99 mg/dL   Comment 1 Notify RN   CBC     Status: Abnormal   Collection Time: 03/01/16  4:31 PM  Result Value Ref Range   WBC 13.5 (H) 3.6 - 11.0 K/uL   RBC 3.48 (L) 3.80 - 5.20 MIL/uL   Hemoglobin 10.4 (L) 12.0 - 16.0 g/dL   HCT 69.631.4 (L) 29.535.0 - 28.447.0 %   MCV 90.2 80.0 - 100.0 fL   MCH 29.8 26.0 - 34.0 pg   MCHC 33.0 32.0 - 36.0 g/dL   RDW 13.215.1 (H) 44.011.5 - 10.214.5 %   Platelets 230 150 - 440 K/uL  Glucose, capillary  Status: Abnormal   Collection Time: 03/01/16  4:34 PM  Result Value Ref Range   Glucose-Capillary 336 (H) 65 - 99 mg/dL   Comment 1 Notify RN     Dg Chest 1 View  Result Date: 02/29/2016 CLINICAL DATA:  Unwitnessed fall today.  Left hip pain. EXAM: CHEST 1 VIEW COMPARISON:  07/22/2015 FINDINGS: Heart and mediastinal contours are within normal limits. No focal opacities or effusions. No acute bony abnormality. IMPRESSION: No active disease. Electronically Signed   By: Charlett Nose M.D.   On: 02/29/2016 07:13   Dg Hip Port Unilat With Pelvis 1v Left  Result Date: 03/01/2016 CLINICAL DATA:  LEFT hip fracture EXAM: DG HIP (WITH OR WITHOUT PELVIS) 1V PORT LEFT COMPARISON:  02/29/2016 FINDINGS: Interval unipolar LEFT hip arthroplasty for repair of femoral neck fracture. Cerclage wire noted. Expected soft tissue gas lateral to the femur. Dense arterial calcification. No complication IMPRESSION: LEFT hip arthroplasty without complication. Electronically Signed   By: Genevive Bi M.D.   On: 03/01/2016 14:18   Dg Hip Unilat W Or Wo Pelvis 2-3 Views Left  Result Date: 02/29/2016 CLINICAL DATA:  Unwitnessed fall today.  Left hip pain. EXAM: DG HIP (WITH OR WITHOUT PELVIS) 2-3V LEFT COMPARISON:  None. FINDINGS:  There is a left femoral neck fracture with varus angulation and impaction. No subluxation or dislocation. Mild degenerative changes in the hips bilaterally. SI joints are symmetric and unremarkable. IMPRESSION: Left femoral neck fracture with varus angulation and impaction. Electronically Signed   By: Charlett Nose M.D.   On: 02/29/2016 07:13    Assessment/Plan: Day of Surgery   Active Problems:   Closed left hip fracture [facility])  Patient appears stable postop. I have reviewed her postop x-rays which show the hemiarthroplasty prosthesis is well positioned. There is no evidence of fracture or dislocation. Patient will CBC checked postop. She will have labs drawn again in the morning. She'll complete 24 hours of postop antibiotics. She'll be weightbearing as tolerated on the left hip but we'll observe posterior hip precautions. Patient will begin with physical and occupational therapy tomorrow.    Juanell Fairly , MD 03/01/2016, 5:09 PM

## 2016-03-01 NOTE — Clinical Social Work Note (Addendum)
Clinical Social Work Assessment  Patient Details  Name: Danielle Boyer MRN: [id number] Date of Birth: 11/10/1940  Date of referral:  03/01/16               Reason for consult:  Facility Placement                Permission sought to share information with:  Family Supports Permission granted to share information::     Name::     Danielle Boyer  Agency::     Relationship::  Son  Contact Information:  [phone]  Housing/Transportation Living arrangements for the past 2 months:  Assisted Living Facility Source of Information:  Adult Children Patient Interpreter Needed:  None Criminal Activity/Legal Involvement Pertinent to Current Situation/Hospitalization:  No - Comment as needed Significant Relationships:  Adult Children Lives with:  Facility Resident Do you feel safe going back to the place where you live?  Yes Need for family participation in patient care:  No (Coment)  Care giving concerns:  Possible SNF placement, PT pending   Social Worker assessment / plan:  Patient is alert and oriented x1. Patient has hearing difficulties even with significantly raised voice. Patient attempts to answer questions but may not understand due to hearing barrier. Assessment conducted with patient's son via telephone.  Patient is currently a resident of [facility]. Upon dc, the goal is to go back to the facility. Renae Fickleaul gave verbal permission to conduct referrals for SNF if indicated by PT. Patient preference is [facility].  At baseline, pt is independent in all ADLs and ambulation. Patient lives in a cottage with 5 other residents and shares a room with one resident.   Pt's PASRR is not SNF appropriate. CSW will submit change screening pending PT evaluation.  Pt's son able to verbalize in is own terms the current treatment plan.  Employment status:  Retired Health and safety inspectornsurance information:  [place] [employer] PT Recommendations:  Not assessed at this time Information / Referral to  community resources:  Skilled Nursing Facility  Patient/Family's Response to care:  Patient confused/Son grateful to CSW for assistance.  Patient/Family's Understanding of and Emotional Response to Diagnosis, Current Treatment, and Prognosis:  Patient confused/Son able to verbalize treatment plan and pleasant/gracious during exchange.  Emotional Assessment Appearance:  Appears stated age Attitude/Demeanor/Rapport:  Inconsistent (Patient is hard of hearing and was confused. Pleasant, but not alert) Affect (typically observed):  Happy Orientation:  Oriented to Self Alcohol / Substance use:  Never Used Psych involvement (Current and /or in the community):  No (Comment)  Discharge Needs  Concerns to be addressed:  Discharge Planning Concerns Readmission within the last 30 days:  No Current discharge risk:  None Barriers to Discharge:  Continued Medical Work up   [employer] CorporationKaren M Emerly Prak, LCSW 03/01/2016, 9:24 AM

## 2016-03-01 NOTE — Addendum Note (Signed)
Addendum  created 03/01/16 1354 by Yves DillPaul Tayelor Osborne, MD   Sign clinical note

## 2016-03-01 NOTE — NC FL2 (Signed)
[place] MEDICAID FL2 LEVEL OF CARE SCREENING TOOL     IDENTIFICATION  Patient Name: [name] Sizerdna M Husser Birthdate: 11/25/1940 Sex: female Admission Date (Current Location): 02/29/2016  [place]   Facility and Address:  [facility], [address], [place], [place] [address]      Provider Number: [id number]  Attending Physician Name and Address:  Shaune PollackQing Chen, MD  Relative Name and Phone Number:       Current Level of Care: Hospital Recommended Level of Care: Skilled Nursing Facility Prior Approval Number:    Date Approved/Denied: 10/02/15 PASRR Number: [id number]  Discharge Plan: SNF    Current Diagnoses: Patient Active Problem List   Diagnosis Date Noted  . Closed left hip fracture (HCC) 02/29/2016  . 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 Essential hypertension 06/07/2015  . Hearing loss 06/07/2015  . Hypothyroidism 06/07/2015  . Cardiovascular disease 06/07/2015  . Atrial fibrillation (HCC) 06/07/2015  . GERD (gastroesophageal reflux disease) 06/07/2015  . IBS (irritable bowel syndrome) 06/07/2015  . Osteoarthritis 06/07/2015  . Paranoid schizophrenia (HCC)   . Schizophrenia (HCC) 06/06/2015  . Diabetes (HCC) 03/19/2015    Orientation RESPIRATION BLADDER Height & Weight     Self, Situation, Time, Place (Patient has dementia)  Normal Continent Weight: 149 lb 4.8 oz (67.7 kg) Height:  5\' 4"  (162.6 cm)  BEHAVIORAL SYMPTOMS/MOOD NEUROLOGICAL BOWEL NUTRITION STATUS      Continent    AMBULATORY STATUS COMMUNICATION OF NEEDS Skin   Total Care Verbally Normal                       Personal Care Assistance Level of Assistance  Bathing, Feeding, Dressing, Total care Bathing Assistance: Maximum assistance Feeding assistance: Maximum assistance Dressing Assistance: Maximum assistance Total Care Assistance: Maximum assistance   Functional Limitations Info  Hearing   Hearing Info: Impaired      SPECIAL  CARE FACTORS FREQUENCY  PT (By licensed PT)     PT Frequency: 5X week 3X day              Contractures Contractures Info: Present    Additional Factors Info  Allergies   Allergies Info:  (Citalopram, Penicillins)           Current Medications (03/01/2016):  This is the current hospital active medication list Current Facility-Administered Medications  Medication Dose Route Frequency Provider Last Rate Last Dose  . [MAR Hold] acetaminophen (TYLENOL) tablet 650 mg  650 mg Oral Q6H PRN Shaune PollackQing Chen, MD       Or  . Mitzi Hansen[MAR Hold] acetaminophen (TYLENOL) suppository 650 mg  650 mg Rectal Q6H PRN Shaune PollackQing Chen, MD      . Mitzi Hansen[MAR Hold] albuterol (PROVENTIL) (2.5 MG/3ML) 0.083% nebulizer solution 2.5 mg  2.5 mg Nebulization Q2H PRN Shaune PollackQing Chen, MD      . Mitzi Hansen[MAR Hold] amLODipine (NORVASC) tablet 10 mg  10 mg Oral Daily Shaune PollackQing Chen, MD      . Mitzi Hansen[MAR Hold] bisacodyl (DULCOLAX) EC tablet 5 mg  5 mg Oral Daily PRN Shaune PollackQing Chen, MD      . Mitzi Hansen[MAR Hold] cefTRIAXone (ROCEPHIN) 1 g in dextrose 5 % 50 mL IVPB  1 g Intravenous Q24H Shaune PollackQing Chen, MD   1 g at 03/01/16 [id number]  . fentaNYL (SUBLIMAZE) injection 25 mcg  25 mcg Intravenous Q5 min PRN Yves DillPaul Carroll, MD   25 mcg at 03/01/16 1400  . [MAR Hold] HYDROcodone-acetaminophen (NORCO/VICODIN) 5-325 MG per tablet 1-2 tablet  1-2 tablet Oral Q4H PRN Shaune Pollack, MD   1 tablet at 02/29/16 1247  . [MAR Hold] insulin aspart (novoLOG) injection 0-5 Units  0-5 Units Subcutaneous QHS Shaune Pollack, MD   3 Units at 02/29/16 2204  . [MAR Hold] insulin aspart (novoLOG) injection 0-9 Units  0-9 Units Subcutaneous TID WC Shaune Pollack, MD   3 Units at 03/01/16 0802  . [MAR Hold] insulin detemir (LEVEMIR) injection 18 Units  18 Units Subcutaneous Daily Shaune Pollack, MD      . Mitzi Hansen Hold] ketorolac (TORADOL) 15 MG/ML injection 15 mg  15 mg Intravenous Q6H PRN Shaune Pollack, MD      . Mitzi Hansen Hold] levothyroxine (SYNTHROID, LEVOTHROID) tablet 50 mcg  50 mcg Oral QAC breakfast Shaune Pollack, MD      . Mitzi Hansen Hold] LORazepam  (ATIVAN) tablet 0.5 mg  0.5 mg Oral TID PRN Shaune Pollack, MD   0.5 mg at 02/29/16 1704  . [MAR Hold] Melatonin TABS 5 mg  5 mg Oral QHS Shaune Pollack, MD   5 mg at 02/29/16 2204  . [MAR Hold] methocarbamol (ROBAXIN) tablet 500 mg  500 mg Oral Q6H PRN Juanell Fairly, MD       Or  . Mitzi Hansen Hold] methocarbamol (ROBAXIN) 500 mg in dextrose 5 % 50 mL IVPB  500 mg Intravenous Q6H PRN Juanell Fairly, MD      . Mitzi Hansen Hold] metoprolol (LOPRESSOR) tablet 50 mg  50 mg Oral BID Shaune Pollack, MD   50 mg at 03/01/16 0803  . [MAR Hold] OLANZapine (ZYPREXA) tablet 10 mg  10 mg Oral QHS Shaune Pollack, MD   10 mg at 02/29/16 2204  . [MAR Hold] ondansetron (ZOFRAN) tablet 4 mg  4 mg Oral Q6H PRN Shaune Pollack, MD       Or  . Mitzi Hansen Hold] ondansetron [facility]) injection 4 mg  4 mg Intravenous Q6H PRN Shaune Pollack, MD      . ondansetron [facility]) injection 4 mg  4 mg Intravenous Once PRN Yves Dill, MD      . Mitzi Hansen Hold] pantoprazole (PROTONIX) EC tablet 40 mg  40 mg Oral Daily Shaune Pollack, MD      . Mitzi Hansen Hold] senna-docusate (Senokot-S) tablet 1 tablet  1 tablet Oral QHS PRN Shaune Pollack, MD      . Mitzi Hansen Hold] traZODone (DESYREL) tablet 100 mg  100 mg Oral QHS Shaune Pollack, MD   100 mg at 02/29/16 2204     Discharge Medications: Please see discharge summary for a list of discharge medications.  Relevant Imaging Results:  Relevant Lab Results:   Additional Information    Judi Cong, LCSW

## 2016-03-01 NOTE — Anesthesia Postprocedure Evaluation (Signed)
Anesthesia Post Note  Patient: Danielle Boyer  Procedure(s) Performed: Procedure(s) (LRB): ARTHROPLASTY BIPOLAR HIP (HEMIARTHROPLASTY) (Left)  Patient location during evaluation: PACU Anesthesia Type: General Level of consciousness: awake and alert and oriented Pain management: pain level controlled Vital Signs Assessment: post-procedure vital signs reviewed and stable Respiratory status: spontaneous breathing Cardiovascular status: blood pressure returned to baseline Anesthetic complications: no    Last Vitals:  Vitals:   03/01/16 0732 03/01/16 1330  BP: (!) 176/72   Pulse: 98   Resp: 18   Temp: 37.4 C (!) 36 C    Last Pain:  Vitals:   03/01/16 1330  TempSrc:   PainSc: Asleep                 Kylyn Sookram

## 2016-03-01 NOTE — Progress Notes (Signed)
Patient NPO, CHG bath given by aide and other nurse. Daughter called to update her that orderly will be coming to pick up patient soon to go to surgery. Daughter states she will be here soon. Foley in place, intact and draining. Continue to monitor.

## 2016-03-01 NOTE — Anesthesia Preprocedure Evaluation (Addendum)
Anesthesia Evaluation  Patient identified by MRN, date of birth, ID band Patient awake    Reviewed: Allergy & Precautions, NPO status , Patient's Chart, lab work & pertinent test results, reviewed documented beta blocker date and time   Airway Mallampati: II       Dental no notable dental hx. (+) Lower Dentures, Upper Dentures   Pulmonary neg pulmonary ROS,    Pulmonary exam normal        Cardiovascular hypertension, Pt. on medications and Pt. on home beta blockers + CAD  Normal cardiovascular exam+ dysrhythmias Atrial Fibrillation      Neuro/Psych PSYCHIATRIC DISORDERS Schizophrenia    GI/Hepatic Neg liver ROS, GERD  Medicated and Controlled,  Endo/Other  diabetes, Well Controlled, Type 2, Insulin Dependent, Oral Hypoglycemic AgentsHypothyroidism   Renal/GU negative Renal ROS  negative genitourinary   Musculoskeletal  (+) Arthritis , Osteoarthritis,  Fx Hip   Abdominal Normal abdominal exam  (+)   Peds negative pediatric ROS (+)  Hematology negative hematology ROS (+)   Anesthesia Other Findings   Reproductive/Obstetrics                            Anesthesia Physical Anesthesia Plan  ASA: III and emergent  Anesthesia Plan: General   Post-op Pain Management:    Induction: Intravenous  Airway Management Planned: Oral ETT  Additional Equipment:   Intra-op Plan:   Post-operative Plan:   Informed Consent: I have reviewed the patients History and Physical, chart, labs and discussed the procedure including the risks, benefits and alternatives for the proposed anesthesia with the patient or authorized representative who has indicated his/her understanding and acceptance.   Dental advisory given  Plan Discussed with: CRNA and Surgeon  Anesthesia Plan Comments: (Family does not want a regional block despite the benefits listed for regional block )      Anesthesia Quick  Evaluation

## 2016-03-02 ENCOUNTER — Encounter: Payer: Self-pay | Admitting: Orthopedic Surgery

## 2016-03-02 LAB — CBC
HCT: 28.5 % — ABNORMAL LOW (ref 35.0–47.0)
HEMOGLOBIN: 9.7 g/dL — AB (ref 12.0–16.0)
MCH: 30.4 pg (ref 26.0–34.0)
MCHC: 33.9 g/dL (ref 32.0–36.0)
MCV: 89.7 fL (ref 80.0–100.0)
PLATELETS: 229 10*3/uL (ref 150–440)
RBC: 3.18 MIL/uL — AB (ref 3.80–5.20)
RDW: 14.6 % — ABNORMAL HIGH (ref 11.5–14.5)
WBC: 11.1 10*3/uL — AB (ref 3.6–11.0)

## 2016-03-02 LAB — BASIC METABOLIC PANEL
ANION GAP: 8 (ref 5–15)
BUN: 11 mg/dL (ref 6–20)
CO2: 26 mmol/L (ref 22–32)
Calcium: 8.3 mg/dL — ABNORMAL LOW (ref 8.9–10.3)
Chloride: 106 mmol/L (ref 101–111)
Creatinine, Ser: 0.64 mg/dL (ref 0.44–1.00)
GFR calc Af Amer: >60 mL/min (ref >60)
Glucose, Bld: 225 mg/dL — ABNORMAL HIGH (ref 65–99)
POTASSIUM: 3.7 mmol/L (ref 3.5–5.1)
SODIUM: 140 mmol/L (ref 135–145)

## 2016-03-02 LAB — URINE CULTURE

## 2016-03-02 LAB — GLUCOSE, CAPILLARY
Glucose-Capillary: 233 mg/dL — ABNORMAL HIGH (ref 65–99)
Glucose-Capillary: 305 mg/dL — ABNORMAL HIGH (ref 65–99)
Glucose-Capillary: 152 mg/dL — ABNORMAL HIGH (ref 65–99)
Glucose-Capillary: 155 mg/dL — ABNORMAL HIGH (ref 65–99)

## 2016-03-02 MED ORDER — MAGNESIUM HYDROXIDE 400 MG/5ML PO SUSP: 30.0000 mL | Freq: Every day | ORAL | Status: DC | PRN

## 2016-03-02 MED ORDER — ATORVASTATIN CALCIUM 20 MG PO TABS
40.0000 mg | ORAL_TABLET | Freq: Every day | ORAL | Status: DC
  Administered 2016-03-02: 40 mg via ORAL
  Filled 2016-03-02: qty 2

## 2016-03-02 MED ORDER — ASPIRIN 81 MG PO CHEW
81.0000 mg | CHEWABLE_TABLET | Freq: Every day | ORAL | Status: DC
  Administered 2016-03-02 – 2016-03-03 (×2): 81 mg via ORAL
  Filled 2016-03-02 (×2): qty 1

## 2016-03-02 MED ORDER — INSULIN ASPART 100 UNIT/ML ~~LOC~~ SOLN
4.0000 [IU] | Freq: Three times a day (TID) | SUBCUTANEOUS | Status: DC
  Administered 2016-03-02 – 2016-03-03 (×2): 4 [IU] via SUBCUTANEOUS
  Filled 2016-03-02 (×2): qty 4

## 2016-03-02 MED ORDER — INSULIN ASPART 100 UNIT/ML ~~LOC~~ SOLN
0.0000 [IU] | Freq: Three times a day (TID) | SUBCUTANEOUS | Status: DC
  Administered 2016-03-02: 11 [IU] via SUBCUTANEOUS
  Administered 2016-03-02: 5 [IU] via SUBCUTANEOUS
  Administered 2016-03-02: 1 [IU] via SUBCUTANEOUS
  Administered 2016-03-03: 3 [IU] via SUBCUTANEOUS
  Filled 2016-03-02: qty 3
  Filled 2016-03-02: qty 11
  Filled 2016-03-02: qty 5
  Filled 2016-03-02: qty 1

## 2016-03-02 MED ORDER — INSULIN ASPART 100 UNIT/ML ~~LOC~~ SOLN: 0.0000 [IU] | Freq: Every day | SUBCUTANEOUS | Status: DC

## 2016-03-02 MED ORDER — CEPHALEXIN 500 MG PO CAPS
500.0000 mg | ORAL_CAPSULE | Freq: Two times a day (BID) | ORAL | Status: DC
  Administered 2016-03-03: 500 mg via ORAL
  Filled 2016-03-02: qty 1

## 2016-03-02 MED ORDER — INSULIN DETEMIR 100 UNIT/ML ~~LOC~~ SOLN
20.0000 [IU] | Freq: Every day | SUBCUTANEOUS | Status: DC
  Administered 2016-03-02 – 2016-03-03 (×2): 20 [IU] via SUBCUTANEOUS
  Filled 2016-03-02 (×3): qty 0.2

## 2016-03-02 NOTE — Progress Notes (Addendum)
PASARR has been received. Plan is for patient to D/C to [facility] when stable.   [employer] Hughes IncorporatedBailey Wylan Gentzler, LCSW [phone]

## 2016-03-02 NOTE — Evaluation (Signed)
Physical Therapy Evaluation Patient Details Name: Danielle Boyer MRN: [id number] DOB: 02-12-1941 Today's Date: 03/02/2016   History of Present Illness  Pt is a 75 y.o. female s/p fall walking to bathroom and sustained  L femoral neck fx.  Pt s/p L hip hemiarthroplasty 03/01/16.  PMH significant for DM, htn, hearing loss, a-fib, paranoid schizophrenia, mild neurocognitive disorder.  Clinical Impression  Prior to admission, per notes pt was independent with ambulation.  Pt appearing confused during session (and also very HOH) and pt with difficulty answering questions intermittently d/t confusion (even with use of pen and paper).  Per notes pt lives at [facility].  Currently pt is max assist x2 supine to sit; min to mod assist x2 to stand with RW; and mod assist x2 to ambulation 3 feet bed to recliner with RW (limited distance d/t L hip pain and pt wanting to sit down).  Pt would benefit from skilled PT to address noted impairments and functional limitations.  Recommend pt discharge to STR when medically appropriate.     Follow Up Recommendations SNF    Equipment Recommendations  Rolling walker with 5" wheels    Recommendations for Other Services       Precautions / Restrictions Precautions Precautions: Posterior Hip Precaution Booklet Issued: Yes (comment) Restrictions Weight Bearing Restrictions: Yes LLE Weight Bearing: Weight bearing as tolerated      Mobility  Bed Mobility Overal bed mobility: Needs Assistance Bed Mobility: Supine to Sit     Supine to sit: Max assist;+2 for physical assistance     General bed mobility comments: Assist for trunk and B LE's; increased assist d/t L hip pain and difficulty following directions (d/t HOH)  Transfers Overall transfer level: Needs assistance Equipment used: Rolling walker (2 wheeled) Transfers: Sit to/from Stand Sit to Stand: Min assist;Mod assist;+2 physical assistance         General transfer comment: vc's and tactile cues  for hand and feet placement; increased effort and time to stand  Ambulation/Gait Ambulation/Gait assistance: Mod assist;+2 physical assistance Ambulation Distance (Feet): 3 Feet (bed to chair) Assistive device: Rolling walker (2 wheeled)   Gait velocity: decreased   General Gait Details: antalgic; decreased stance time L LE; max vc's for technique and precautions and not to sit too early; limited distance d/t L hip pain  Stairs            Wheelchair Mobility    Modified Rankin (Stroke Patients Only)       Balance Overall balance assessment: Needs assistance Sitting-balance support: Bilateral upper extremity supported;Feet supported Sitting balance-Leahy Scale: Poor Sitting balance - Comments: R lateral lean (d/t L hip pain) Postural control: Right lateral lean Standing balance support: Bilateral upper extremity supported (on RW) Standing balance-Leahy Scale: Fair Standing balance comment: static standing                             Pertinent Vitals/Pain Pain Assessment: Faces Faces Pain Scale: Hurts even more (with activity 6/10; at rest 2/10) Pain Location: L hip Pain Descriptors / Indicators: Tender;Sore Pain Intervention(s): Limited activity within patient's tolerance;Monitored during session;Premedicated before session;Repositioned  Vitals stable and WFL throughout treatment session.    Home Living Family/patient expects to be discharged to:: Skilled nursing facility                 Additional Comments: Per notes, pt lives at [facility]; lives in cottage with 5 other residents and shares room  with 1 resident.    Prior Function           Comments: Per notes, pt ambulating independently.     Hand Dominance        Extremity/Trunk Assessment   Upper Extremity Assessment: Defer to OT evaluation           Lower Extremity Assessment: RLE deficits/detail;LLE deficits/detail RLE Deficits / Details: AROM R hip flexion, knee  flexion/extension, and DF at least 3/5 LLE Deficits / Details: L hip flexion at least 2+/5 (limited d/t pain), knee flexion/extension, and DF at least 3/5     Communication   Communication: HOH (utilized pen and paper to improve communication)  Cognition Arousal/Alertness: Awake/alert Behavior During Therapy: WFL for tasks assessed/performed Overall Cognitive Status:  (Oriented to person but not to place, time, and situation)       Memory: Decreased recall of precautions;Decreased short-term memory              General Comments General comments (skin integrity, edema, etc.): L hip dressing intact.  Nursing cleared pt for participation in physical therapy.  Pt agreeable to PT session.    Exercises Total Joint Exercises Ankle Circles/Pumps: AAROM;Strengthening;Both;10 reps;Supine Short Arc Quad: PROM;AAROM;Strengthening;Both;10 reps;Supine Heel Slides: PROM;AAROM;Strengthening;Both;10 reps;Supine Hip ABduction/ADduction: PROM;AAROM;Strengthening;Both;10 reps;Supine  Varied between PROM to [facility] for ex's (required vc's and tactile cues for participation); L LE limited d/t c/o L hip pain with movement.      Assessment/Plan    PT Assessment Patient needs continued PT services  PT Diagnosis Difficulty walking;Acute pain   PT Problem List Decreased strength;Decreased activity tolerance;Decreased balance;Decreased mobility;Decreased cognition;Decreased knowledge of use of DME;Decreased safety awareness;Decreased knowledge of precautions;Pain  PT Treatment Interventions DME instruction;Gait training;Functional mobility training;Therapeutic activities;Therapeutic exercise;Balance training;Patient/family education   PT Goals (Current goals can be found in the Care Plan section) Acute Rehab PT Goals Patient Stated Goal: to have less L hip pain PT Goal Formulation: With patient Time For Goal Achievement: 03/16/16 Potential to Achieve Goals: Good    Frequency BID   Barriers to  discharge Decreased caregiver support      Co-evaluation               End of Session Equipment Utilized During Treatment: Gait belt Activity Tolerance: Patient limited by pain Patient left: in chair;with call Rocco/phone within reach;with chair alarm set;with SCD's reapplied (B heels elevated via pillows; pillows placed between pt's knees) Nurse Communication: Mobility status;Precautions;Weight bearing status         Time: [id number] PT Time Calculation (min) (ACUTE ONLY): 42 min   Charges:   PT Evaluation $PT Eval Low Complexity: 1 Procedure PT Treatments $Therapeutic Exercise: 8-22 mins   PT G CodesHendricks Limes:        Saxon Barich 03/02/2016, 10:02 AM Hendricks LimesEmily Ceniya Fowers, PT [phone]

## 2016-03-02 NOTE — Progress Notes (Signed)
Physical Therapy Treatment Patient Details Name: Danielle Boyer MRN: [id number] DOB: 12/29/1940 Today's Date: 03/02/2016    History of Present Illness Pt is a 75 y.o. female s/p fall walking to bathroom and sustained  L femoral neck fx.  Pt s/p L hip hemiarthroplasty 03/01/16 with post hip precautions.  PMH significant for DM, htn, hearing loss, a-fib, paranoid schizophrenia, mild neurocognitive disorder.    PT Comments    Pt soundly sleeping; requires moderate sternal rub, tapping and voice to awaken. Pt awakens and agreeable to PT; however, unable to truly engage in PT session. Pt continues lethargic with bouts of talking on subjects unrelated and with no rhyme or reason. Lower extremity exercises performed primarily with passive range of motion. Pt does make facial pain expressions and verbally notes "it hurts" with some exercises on the left. Continue PT to progress range, strength and activity tolerance as well as purposeful participation to improve functional mobility.   Follow Up Recommendations  SNF     Equipment Recommendations  Rolling walker with 5" wheels    Recommendations for Other Services       Precautions / Restrictions Precautions Precautions: Posterior Hip Required Braces or Orthoses: Other Brace/Splint (Abductor wedge in bed) Restrictions Weight Bearing Restrictions: Yes LLE Weight Bearing: Weight bearing as tolerated    Mobility  Bed Mobility               General bed mobility comments: Not tested due to lethargy, inability to follow commands/instructions  Transfers                    Ambulation/Gait                 Stairs            Wheelchair Mobility    Modified Rankin (Stroke Patients Only)       Balance                                    Cognition Arousal/Alertness: Lethargic Behavior During Therapy:  (confused/talks about unrelated subjects) Overall Cognitive Status: Difficult to assess                       Exercises Total Joint Exercises Ankle Circles/Pumps: AAROM;Both;15 reps;Supine Quad Sets: Other (comment) (unable to comprehend) Short Arc Quad: PROM;15 reps;Supine;Both Heel Slides: PROM;Both;15 reps;Supine Hip ABduction/ADduction: PROM;Both;15 reps;Supine Straight Leg Raises: Other (comment) (Attempted L; increased c/o pain)    General Comments General comments (skin integrity, edema, etc.): incision/dressing clean, dry, intact      Pertinent Vitals/Pain Pain Assessment: Faces Faces Pain Scale: Hurts even more Pain Location: L hip with movement Pain Intervention(s): Limited activity within patient's tolerance;Monitored during session    Home Living                      Prior Function            PT Goals (current goals can now be found in the care plan section) Progress towards PT goals: PT to reassess next treatment    Frequency  BID    PT Plan Current plan remains appropriate    Co-evaluation             End of Session   Activity Tolerance: Patient limited by lethargy;Patient limited by pain Patient left: with bed alarm set;in bed;with call [place]/phone within reach;with SCD's reapplied  Time: 1610-9604 PT Time Calculation (min) (ACUTE ONLY): 18 min  Charges:  $Therapeutic Exercise: 8-22 mins                    G Codes:      Kristeen Miss, PTA 03/02/2016, 4:09 PM

## 2016-03-02 NOTE — Progress Notes (Addendum)
Subjective:  Postoperative day #1 status post left hip hemiarthroplasty. Patient is very confused. She is sleeping comfortably when I entered the room. Patient awoke with minimal stimulation but is unable to tell me whether she has extrinsic hip pain. She appears comfortable. Patient cannot provide a history or participate with her physical exam.   Objective:   VITALS:   Vitals:   03/01/16 1938 03/02/16 0422 03/02/16 0803 03/02/16 0930  BP: 136/60 (!) 134/54 (!) 126/44   Pulse: (!) 102 88 91 94  Resp: 18 18 18    Temp: 97.8 F (36.6 C) 98.6 F (37 C) 99.8 F (37.7 C)   TempSrc: Oral Oral Oral   SpO2: 95% 99% 91% 94%  Weight:      Height:        PHYSICAL EXAM:  Left hip: Patient has minimal serosanguineous drainage on her honeycomb dressing. She is no erythema or ecchymosis or significant swelling of the left thigh. Her thigh compartments are soft and compressible. She has no calf tenderness. She has palpable pedal pulses. I cannot assess her motor and sensory function today and she would not follow commands.   LABS  Results for orders placed or performed during the hospital encounter of 02/29/16 (from the past 24 hour(s))  CBC     Status: Abnormal   Collection Time: 03/01/16  4:31 PM  Result Value Ref Range   WBC 13.5 (H) 3.6 - 11.0 K/uL   RBC 3.48 (L) 3.80 - 5.20 MIL/uL   Hemoglobin 10.4 (L) 12.0 - 16.0 g/dL   HCT 62.931.4 (L) 52.835.0 - 41.347.0 %   MCV 90.2 80.0 - 100.0 fL   MCH 29.8 26.0 - 34.0 pg   MCHC 33.0 32.0 - 36.0 g/dL   RDW 24.415.1 (H) 01.011.5 - 27.214.5 %   Platelets 230 150 - 440 K/uL  Glucose, capillary     Status: Abnormal   Collection Time: 03/01/16  4:34 PM  Result Value Ref Range   Glucose-Capillary 336 (H) 65 - 99 mg/dL   Comment 1 Notify RN   Glucose, capillary     Status: Abnormal   Collection Time: 03/01/16  9:24 PM  Result Value Ref Range   Glucose-Capillary 317 (H) 65 - 99 mg/dL   Comment 1 Notify RN   CBC     Status: Abnormal   Collection Time: 03/02/16  4:25  AM  Result Value Ref Range   WBC 11.1 (H) 3.6 - 11.0 K/uL   RBC 3.18 (L) 3.80 - 5.20 MIL/uL   Hemoglobin 9.7 (L) 12.0 - 16.0 g/dL   HCT 53.628.5 (L) 64.435.0 - 03.447.0 %   MCV 89.7 80.0 - 100.0 fL   MCH 30.4 26.0 - 34.0 pg   MCHC 33.9 32.0 - 36.0 g/dL   RDW 74.214.6 (H) 59.511.5 - 63.814.5 %   Platelets 229 150 - 440 K/uL  Basic metabolic panel     Status: Abnormal   Collection Time: 03/02/16  4:25 AM  Result Value Ref Range   Sodium 140 135 - 145 mmol/L   Potassium 3.7 3.5 - 5.1 mmol/L   Chloride 106 101 - 111 mmol/L   CO2 26 22 - 32 mmol/L   Glucose, Bld 225 (H) 65 - 99 mg/dL   BUN 11 6 - 20 mg/dL   Creatinine, Ser 7.560.64 0.44 - 1.00 mg/dL   Calcium 8.3 (L) 8.9 - 10.3 mg/dL   GFR calc non Af Amer >60 >60 mL/min   GFR calc Af Amer >60 >60 mL/min  Anion gap 8 5 - 15  Glucose, capillary     Status: Abnormal   Collection Time: 03/02/16  7:47 AM  Result Value Ref Range   Glucose-Capillary 233 (H) 65 - 99 mg/dL  Glucose, capillary     Status: Abnormal   Collection Time: 03/02/16 11:55 AM  Result Value Ref Range   Glucose-Capillary 305 (H) 65 - 99 mg/dL   Comment 1 Notify RN     Dg Hip Port Unilat With Pelvis 1v Left  Result Date: 03/01/2016 CLINICAL DATA:  LEFT hip fracture EXAM: DG HIP (WITH OR WITHOUT PELVIS) 1V PORT LEFT COMPARISON:  02/29/2016 FINDINGS: Interval unipolar LEFT hip arthroplasty for repair of femoral neck fracture. Cerclage wire noted. Expected soft tissue gas lateral to the femur. Dense arterial calcification. No complication IMPRESSION: LEFT hip arthroplasty without complication. Electronically Signed   By: Genevive Bi M.D.   On: 03/01/2016 14:18    Assessment/Plan: 1 Day Post-Op   Active Problems:   Closed left hip fracture (HCC)  Patient stable postop. Her hemoglobin and hematocrit were within acceptable limits. Patient completed 24 hours of postop antibiotics. Foley catheter has been removed.  Plan for skilled nursing facility upon discharge. In the meantime continue  physical therapy as the patient can tolerate.  Patient has dementia and therefore may WEIGHT BEAR AS TOLERATED on the left lower extremity with the assistance of a walker. She will not be able to comply with partial weightbearing instructions. She is on POSTERIOR HIP PRECAUTIONS.  Patient should continue to receive physical therapy for hip and lower extremity range of motion, strengthening and gait training. She should elevate the left lower extremity whenever possible. She may apply ice to the surgical site to help reduce swelling. Dressing should be changed when necessary basis if drainage is observed. Continue DVT prophylaxis for 4-6 weeks postop. Patient will follow with orthopedic surgeon in his office in 10-14 days. Staples will be removed in the orthopedic office.  [facility], Dr. Martha Clan, [phone] should use a walker for assistance with ambulation at all times.    Juanell Fairly , MD 03/02/2016, 3:58 PM

## 2016-03-02 NOTE — Progress Notes (Signed)
Clinical Social Worker (CSW) attempted to meet with patient to give SNF bed offers. Patient is very hard of hearing and appeared confused. CSW contacted patient's daughter Synetta Failnita and presented bed offers. Daughter chose [facility]. [facility] liaison is aware of accepted bed offer. [facility] is pending. CSW will continue to follow and assist as needed.   Baker Hughes IncorporatedBailey Aldean Pipe, LCSW [phone]

## 2016-03-02 NOTE — Evaluation (Signed)
Occupational Therapy Evaluation Patient Details Name: Danielle Boyer MRN: [id number] DOB: 1941/06/17 Today's Date: 03/02/2016    History of Present Illness Pt is a 75 y.o. female s/p fall walking to bathroom and sustained  L femoral neck fx.  Pt s/p L hip hemiarthroplasty 03/01/16 with post hip precautions.  PMH significant for DM, htn, hearing loss, a-fib, paranoid schizophrenia, mild neurocognitive disorder.   Clinical Impression   Pt is 75 year old female who fell walking to bathroom and sustained a L femoral neck fx and is s/p L hip hemiarthroplasty with post hip precautions on 03/01/16.  Per notes, pt lives at [facility]; lives in cottage with 5 other residents and shares room with 1 resident. She is very [facility] and therapist used written communication and pt able to verbalize response.  She is confused and not all answers made sense and pt very focused on L hip pain. Pt most likely is going to need a lot of repetition and reminders about post hip precautions and most likely will not able to comprehend use of AD for ADLs.  Rec future discussion with family about baseline cogntive status to assess for needs further for AD. She currently is able to feed self, complete automatic tasks for self care but is not aware of or understand post hip precautions at this time and therefore dependent for LB bathing and dressing.  She requires max assist of 2 people for bed mobility and transfers per PT but was able to take a few steps. Rec continued OT while in hospital and then SNF after discharge.      Follow Up Recommendations  SNF    Equipment Recommendations       Recommendations for Other Services       Precautions / Restrictions Precautions Precautions: Posterior Hip Precaution Booklet Issued: Yes (comment) Restrictions Weight Bearing Restrictions: Yes LLE Weight Bearing: Weight bearing as tolerated      Mobility Bed Mobility Overal bed mobility: Needs Assistance Bed Mobility: Supine to  Sit     Supine to sit: Max assist;+2 for physical assistance     General bed mobility comments: Assist for trunk and B LE's; increased assist d/t L hip pain and difficulty following directions (d/t HOH)  Transfers Overall transfer level: Needs assistance Equipment used: Rolling walker (2 wheeled) Transfers: Sit to/from Stand Sit to Stand: Min assist;Mod assist;+2 physical assistance         General transfer comment: vc's and tactile cues for hand and feet placement; increased effort and time to stand    Balance Overall balance assessment: Needs assistance Sitting-balance support: Bilateral upper extremity supported;Feet supported Sitting balance-Leahy Scale: Poor Sitting balance - Comments: R lateral lean (d/t L hip pain) Postural control: Right lateral lean Standing balance support: Bilateral upper extremity supported (on RW) Standing balance-Leahy Scale: Fair Standing balance comment: static standing                            ADL Overall ADL's : Needs assistance/impaired                                       General ADL Comments: Pt is very HOH and used written communication and pt able to verbalize response.  She is confused and not all answers made sense and pt very focused on L hip pain. Pt most likely is going to  need a lot of repetition and reminders about post hip precautions and most likely will not able to comprehend use of AD for ADLs.  Rec future discussion with family about baseline cogntive status to assess for needs further for AD. She currently is able to feed self, complete automatic tasks for self care but is not aware of or understand post hip precautions at this time and therefore dependent for LB bathing and dressing.  She requires max assist of 2 people for bed mobility and transfers per PT but was able to take a few steps.       Vision     Perception     Praxis      Pertinent Vitals/Pain Pain Assessment: Faces Faces  Pain Scale: Hurts even more Pain Location: L hip Pain Descriptors / Indicators: Constant;Sore Pain Intervention(s): Limited activity within patient's tolerance;Monitored during session;Patient requesting pain meds-RN notified     Hand Dominance Right   Extremity/Trunk Assessment Upper Extremity Assessment Upper Extremity Assessment: Generalized weakness (Pt able to use BUEs and hands but weak and required encouragement to raise arms more than 80 degrees at shoulder )   Lower Extremity Assessment Lower Extremity Assessment: Defer to PT evaluation RLE Deficits / Details: AROM R hip flexion, knee flexion/extension, and DF at least 3/5 LLE Deficits / Details: L hip flexion at least 2+/5 (limited d/t pain), knee flexion/extension, and DF at least 3/5 LLE: Unable to fully assess due to pain       Communication Communication Communication: HOH (rec using written communication )   Cognition Arousal/Alertness: Awake/alert Behavior During Therapy: Anxious (confused but able to follow simple commands for self care) Overall Cognitive Status: Difficult to assess       Memory: Decreased recall of precautions;Decreased short-term memory             General Comments       Exercises       Shoulder Instructions      Home Living Family/patient expects to be discharged to:: Skilled nursing facility                                 Additional Comments: Per notes, pt lives at [facility]; lives in cottage with 5 other residents and shares room with 1 resident.      Prior Functioning/Environment          Comments: Per notes, pt ambulating independently.    OT Diagnosis: Generalized weakness;Acute pain   OT Problem List: Decreased strength;Decreased range of motion;Decreased activity tolerance;Decreased cognition;Decreased safety awareness;Decreased knowledge of use of DME or AE;Impaired balance (sitting and/or standing);Pain   OT Treatment/Interventions:  Self-care/ADL training;Patient/family education;Therapeutic activities    OT Goals(Current goals can be found in the care plan section) Acute Rehab OT Goals Patient Stated Goal: "get something to help with this bad hip pain" OT Goal Formulation: With patient Time For Goal Achievement: 03/16/16 Potential to Achieve Goals: Good ADL Goals Pt Will Perform Lower Body Dressing: with mod assist;sit to/from stand (with no LOB standing using FWW) Pt Will Transfer to Toilet: with max assist;bedside commode;stand pivot transfer (with FWW and no LOB)  OT Frequency: Min 1X/week   Barriers to D/C:            Co-evaluation              End of Session Nurse Communication: Patient requests pain meds  Activity Tolerance: Patient limited by pain Patient left:  in chair;with call [place]/phone within reach;with chair alarm set (pt pushed closer to wall to prevent falling out of R side of chair due to lean and pain)   Time: 1610-9604 OT Time Calculation (min): 25 min Charges:  OT General Charges $OT Visit: 1 Procedure OT Evaluation $OT Eval Moderate Complexity: 1 Procedure OT Treatments $Self Care/Home Management : 8-22 mins G-Codes:     Susanne Borders, OTR/L ascom [phone] 03/02/16, 11:43 AM

## 2016-03-02 NOTE — Progress Notes (Signed)
[facility] -  at [facility]   PATIENT NAME: Danielle Boyer    MR#:  [id number]  DATE OF BIRTH:  08/19/1940  SUBJECTIVE:  CHIEF COMPLAINT:   Chief Complaint  Patient presents with  . Hip Injury   She is confused, has Left hip pain. REVIEW OF SYSTEMS:  Review of Systems  Constitutional: Negative for chills, fever and malaise/fatigue.  HENT: Negative for congestion and sore throat.   Eyes: Negative for blurred vision and double vision.  Respiratory: Negative for cough, sputum production and shortness of breath.   Cardiovascular: Negative for chest pain, palpitations, orthopnea and leg swelling.  Gastrointestinal: Negative for abdominal pain, blood in stool, diarrhea, melena, nausea and vomiting.  Genitourinary: Negative for dysuria and urgency.  Musculoskeletal: Positive for joint pain.  Skin: Negative for rash.  Neurological: Negative for dizziness, tingling, focal weakness and headaches.  Psychiatric/Behavioral: Negative for depression.    DRUG ALLERGIES:   Allergies  Allergen Reactions  . Citalopram Other (See Comments)    Reaction:  Unknown   . Penicillins Rash and Other (See Comments)    Unable to obtain enough information to answer additional questions about this medication.  Has patient had a PCN reaction causing immediate rash, facial/tongue/throat swelling, SOB or lightheadedness with hypotension: Yes Has patient had a PCN reaction causing severe rash involving mucus membranes or skin necrosis: No Has patient had a PCN reaction that required hospitalization: unknown Has patient had a PCN reaction occurring within the last 10 years: unknown If all of the above answers are "NO", t   VITALS:  Blood pressure (!) 126/44, pulse 94, temperature 99.8 F (37.7 C), temperature source Oral, resp. rate 18, height 5\' 4"  (1.626 m), weight 149 lb 4.8 oz (67.7 kg), SpO2 94 %. PHYSICAL EXAMINATION:  Physical Exam  Constitutional: She is well-developed,  well-nourished, and in no distress.  HENT:  Head: Normocephalic and atraumatic.  Mouth/Throat: Oropharynx is clear and moist.  Eyes: Conjunctivae and EOM are normal. Pupils are equal, round, and reactive to light. No scleral icterus.  Neck: Normal range of motion. Neck supple. No JVD present. No tracheal deviation present. No thyromegaly present.  Cardiovascular: Normal rate, regular rhythm and normal heart sounds.  Exam reveals no gallop.   No murmur heard. Pulmonary/Chest: Effort normal and breath sounds normal. No respiratory distress. She has no wheezes. She has no rales.  Abdominal: Soft. Bowel sounds are normal. She exhibits no distension. There is no tenderness.  Musculoskeletal: She exhibits no edema.  Limited movement on left leg.  Lymphadenopathy:    She has no cervical adenopathy.  Neurological: She is alert. No cranial nerve deficit.  Confused and Demented.  Skin: Skin is warm.   LABORATORY PANEL:   CBC  Recent Labs Lab 03/02/16 0425  WBC 11.1*  HGB 9.7*  HCT 28.5*  PLT 229   ------------------------------------------------------------------------------------------------------------------ Chemistries   Recent Labs Lab 03/02/16 0425  NA 140  K 3.7  CL 106  CO2 26  GLUCOSE 225*  BUN 11  CREATININE 0.64  CALCIUM 8.3*   RADIOLOGY:  No results found. ASSESSMENT AND PLAN:   Left hip fracture. POD1. S/p hemiarthroplasty prosthesis. Pain control, DVT prophylaxis. resume aspirin. PT/OT.  UTI. Continue rocephin and follow up urine culture. CAD. resume aspirin. Hypertension. Continue hypertension medication Diabetes. Hold metformin, increase levemir to 20 units HS and moderate sliding scale. Add novolog 4 unit AC. Anemia of chronic disease. Stable.  All the records are reviewed and case discussed with Care  Management/Social Worker. Management plans discussed with the patient, her sister and they are in agreement.  CODE STATUS: full code.  TOTAL TIME  TAKING CARE OF THIS PATIENT: 35 minutes.   More than 50% of the time was spent in counseling/coordination of care: YES  POSSIBLE D/C to SNF IN 2 DAYS, DEPENDING ON CLINICAL CONDITION.   Danielle [facility]er, [facility]er M.D on 03/02/2016 at 3:00 PM  Between 7am to 6pm - Pager - [phone]  After 6pm go to [email or web address] - Social research officer, governmentpassword EPAS [facility]  [facility]  Office  [phone]  CC: Primary care physician; Marguarite ArbourSPARKS,JEFFREY D, MD  Note: This dictation was prepared with Dragon dictation along with smaller phrase technology. Any transcriptional errors that result from this process are unintentional.

## 2016-03-02 NOTE — Progress Notes (Signed)
Inpatient Diabetes Program Recommendations  AACE/ADA: New Consensus Statement on Inpatient Glycemic Control (2015)  Target Ranges:  Prepandial:   less than 140 mg/dL      Peak postprandial:   less than 180 mg/dL (1-2 hours)      Critically ill patients:  140 - 180 mg/dL   Results for Danielle Boyer, Danielle Boyer (MRN [id number]) as of 03/02/2016 12:44  Ref. Range 03/01/2016 07:43 03/01/2016 16:34 03/01/2016 21:24  Glucose-Capillary Latest Ref Range: 65 - 99 mg/dL 865223 (H) 784336 (H) 696317 (H)   Results for Danielle Boyer, Danielle Boyer (MRN [id number]) as of 03/02/2016 12:44  Ref. Range 03/02/2016 07:47 03/02/2016 11:55  Glucose-Capillary Latest Ref Range: 65 - 99 mg/dL 440233 (H) 102305 (H)    Home DM Meds: Metformin 1000 mg bid       Levemir 18 units daily  Current Insulin Orders: Levemir 20 units daily      Novolog Moderate Correction Scale/ SSI (0-15 units) TID AC + HS    -Note Levemir increased today to 20 units daily.  -Patient also having elevated postprandial CBGs.    MD- Please consider starting Novolog Meal Coverage:  Novolog 4 units tid with meals (hold if pt eats <50% of meal)      --Will follow patient during hospitalization--  Ambrose FinlandJeannine Johnston Isabela Nardelli RN, MSN, CDE Diabetes Coordinator Inpatient Glycemic Control Team Team Pager: [phone] (8a-5p)

## 2016-03-03 LAB — CBC
HEMATOCRIT: 26.5 % — AB (ref 35.0–47.0)
HEMOGLOBIN: 9 g/dL — AB (ref 12.0–16.0)
MCH: 30.7 pg (ref 26.0–34.0)
MCHC: 33.9 g/dL (ref 32.0–36.0)
MCV: 90.5 fL (ref 80.0–100.0)
Platelets: 232 10*3/uL (ref 150–440)
RBC: 2.93 MIL/uL — ABNORMAL LOW (ref 3.80–5.20)
RDW: 15.1 % — ABNORMAL HIGH (ref 11.5–14.5)
WBC: 10.9 10*3/uL (ref 3.6–11.0)

## 2016-03-03 LAB — BASIC METABOLIC PANEL
Anion gap: 8 (ref 5–15)
BUN: 10 mg/dL (ref 6–20)
CHLORIDE: 107 mmol/L (ref 101–111)
CO2: 24 mmol/L (ref 22–32)
CREATININE: 0.56 mg/dL (ref 0.44–1.00)
Calcium: 8.2 mg/dL — ABNORMAL LOW (ref 8.9–10.3)
GFR calc non Af Amer: >60 mL/min (ref >60)
GLUCOSE: 150 mg/dL — AB (ref 65–99)
Potassium: 3.3 mmol/L — ABNORMAL LOW (ref 3.5–5.1)
Sodium: 139 mmol/L (ref 135–145)

## 2016-03-03 LAB — GLUCOSE, CAPILLARY
GLUCOSE-CAPILLARY: 156 mg/dL — AB (ref 65–99)
GLUCOSE-CAPILLARY: 308 mg/dL — AB (ref 65–99)
GLUCOSE-CAPILLARY: 283 mg/dL — AB (ref 65–99)

## 2016-03-03 LAB — SURGICAL PATHOLOGY

## 2016-03-03 LAB — MAGNESIUM: Magnesium: 1.9 mg/dL (ref 1.7–2.4)

## 2016-03-03 MED ORDER — BISACODYL 10 MG RE SUPP: 10.0000 mg | Freq: Every day | RECTAL | Status: DC | PRN

## 2016-03-03 MED ORDER — INSULIN DETEMIR 100 UNIT/ML ~~LOC~~ SOLN: 20.0000 [IU] | Freq: Every day | SUBCUTANEOUS | Status: AC

## 2016-03-03 MED ORDER — HYDROCODONE-ACETAMINOPHEN 5-325 MG PO TABS: 1.0000 | ORAL_TABLET | ORAL | 0 refills | Status: DC | PRN

## 2016-03-03 MED ORDER — DOCUSATE SODIUM 100 MG PO CAPS: 100.0000 mg | ORAL_CAPSULE | Freq: Two times a day (BID) | ORAL | 0 refills | Status: AC

## 2016-03-03 MED ORDER — LORAZEPAM 0.5 MG PO TABS: 0.5000 mg | ORAL_TABLET | Freq: Three times a day (TID) | ORAL | 0 refills | Status: DC | PRN

## 2016-03-03 MED ORDER — CIPROFLOXACIN HCL 500 MG PO TABS: 500.0000 mg | ORAL_TABLET | Freq: Two times a day (BID) | ORAL | 0 refills | Status: AC

## 2016-03-03 MED ORDER — FERROUS SULFATE 325 (65 FE) MG PO TABS: 325.0000 mg | ORAL_TABLET | Freq: Three times a day (TID) | ORAL | 3 refills | Status: AC

## 2016-03-03 MED ORDER — ENOXAPARIN SODIUM 30 MG/0.3ML ~~LOC~~ SOLN: 30.0000 mg | Freq: Two times a day (BID) | SUBCUTANEOUS | Status: DC

## 2016-03-03 MED ORDER — BISACODYL 5 MG PO TBEC: 5.0000 mg | DELAYED_RELEASE_TABLET | Freq: Every day | ORAL | 0 refills | Status: AC | PRN

## 2016-03-03 MED ORDER — POTASSIUM CHLORIDE CRYS ER 20 MEQ PO TBCR
40.0000 meq | EXTENDED_RELEASE_TABLET | Freq: Once | ORAL | Status: AC
  Administered 2016-03-03: 40 meq via ORAL
  Filled 2016-03-03: qty 2

## 2016-03-03 NOTE — Progress Notes (Signed)
Subjective:  Postoperative day #2 status post left hip hemiarthroplasty. Patient is significantly confused and is unable to provide a history. She does not indicate any complaints and appears comfortable.   Objective:   VITALS:   Vitals:   03/03/16 0349 03/03/16 0351 03/03/16 0925 03/03/16 0927  BP: (!) 124/42 (!) 130/50 (!) 137/42   Pulse: 78  87 92  Resp:   16   Temp:   99.6 F (37.6 C)   TempSrc:   Oral   SpO2: 92%  93% 93%  Weight:      Height:        PHYSICAL EXAM:  Left lower extremity: Patient's dressing is minimal serous single-stranded. There is no active drainage from incision. There is no erythema or ecchymosis or significant swelling. Her thigh compartments are compressible. She has TED stockings and foot pumps in place. She has palpable pedal pulses. Motor and sensory function cannot be tested as the patient is unable to cooperate with exam due to her dementia.   LABS  Results for orders placed or performed during the hospital encounter of 02/29/16 (from the past 24 hour(s))  Glucose, capillary     Status: Abnormal   Collection Time: 03/02/16  4:22 PM  Result Value Ref Range   Glucose-Capillary 152 (H) 65 - 99 mg/dL  Glucose, capillary     Status: Abnormal   Collection Time: 03/02/16  8:51 PM  Result Value Ref Range   Glucose-Capillary 155 (H) 65 - 99 mg/dL  CBC     Status: Abnormal   Collection Time: 03/03/16  5:03 AM  Result Value Ref Range   WBC 10.9 3.6 - 11.0 K/uL   RBC 2.93 (L) 3.80 - 5.20 MIL/uL   Hemoglobin 9.0 (L) 12.0 - 16.0 g/dL   HCT 16.126.5 (L) 09.635.0 - 04.547.0 %   MCV 90.5 80.0 - 100.0 fL   MCH 30.7 26.0 - 34.0 pg   MCHC 33.9 32.0 - 36.0 g/dL   RDW 40.915.1 (H) 81.111.5 - 91.414.5 %   Platelets 232 150 - 440 K/uL  Basic metabolic panel     Status: Abnormal   Collection Time: 03/03/16  5:03 AM  Result Value Ref Range   Sodium 139 135 - 145 mmol/L   Potassium 3.3 (L) 3.5 - 5.1 mmol/L   Chloride 107 101 - 111 mmol/L   CO2 24 22 - 32 mmol/L   Glucose, Bld  150 (H) 65 - 99 mg/dL   BUN 10 6 - 20 mg/dL   Creatinine, Ser 7.820.56 0.44 - 1.00 mg/dL   Calcium 8.2 (L) 8.9 - 10.3 mg/dL   GFR calc non Af Amer >60 >60 mL/min   GFR calc Af Amer >60 >60 mL/min   Anion gap 8 5 - 15  Magnesium     Status: None   Collection Time: 03/03/16  5:03 AM  Result Value Ref Range   Magnesium 1.9 1.7 - 2.4 mg/dL  Glucose, capillary     Status: Abnormal   Collection Time: 03/03/16  7:20 AM  Result Value Ref Range   Glucose-Capillary 156 (H) 65 - 99 mg/dL  Glucose, capillary     Status: Abnormal   Collection Time: 03/03/16 11:17 AM  Result Value Ref Range   Glucose-Capillary 308 (H) 65 - 99 mg/dL   Comment 1 Notify RN     Dg Hip Port Unilat With Pelvis 1v Left  Result Date: 03/01/2016 CLINICAL DATA:  LEFT hip fracture EXAM: DG HIP (WITH OR WITHOUT PELVIS) 1V PORT  LEFT COMPARISON:  02/29/2016 FINDINGS: Interval unipolar LEFT hip arthroplasty for repair of femoral neck fracture. Cerclage wire noted. Expected soft tissue gas lateral to the femur. Dense arterial calcification. No complication IMPRESSION: LEFT hip arthroplasty without complication. Electronically Signed   By: Genevive Bi M.D.   On: 03/01/2016 14:18    Assessment/Plan: 2 Days Post-Op   Active Problems:   Closed left hip fracture [facility])  Patient is doing well postop. She is going to skilled nursing facility today. Patient will remain on posterior hip precautions. She is weightbearing as tolerated on the left lower extremity. Patient follow-up in my office in 2 weeks for wound check and staple removal. She will continue on Lovenox for DVT prophylaxis for 4 weeks.    Juanell Fairly , MD 03/03/2016, 12:00 PM

## 2016-03-03 NOTE — Progress Notes (Signed)
Pt transferred to [facility] via EMS. Family (sister) at bedside, daughter informed of transfer. EMS provided transport. Pt had lg BM just prior to transport.

## 2016-03-03 NOTE — Care Management Important Message (Signed)
Important Message  Patient Details  Name: Danielle Boyer MRN: [id number] Date of Birth: 05/24/1941   Medicare Important Message Given:  Yes    [name], [name], RN 03/03/2016, 9:38 AM

## 2016-03-03 NOTE — Clinical Social Work Placement (Signed)
   CLINICAL SOCIAL WORK PLACEMENT  NOTE  Date:  03/03/2016  Patient Details  Name: Danielle Boyer MRN: [id number] Date of Birth: 03/03/1941  Clinical Social Work is seeking post-discharge placement for this patient at the Skilled  Nursing Facility level of care (*CSW will initial, date and re-position this form in  chart as items are completed):  Yes   Patient/family provided with [facility] Clinical Social Work Department's list of facilities offering this level of care within the geographic area requested by the patient (or if unable, by the patient's family).  Yes   Patient/family informed of their freedom to choose among providers that offer the needed level of care, that participate in Medicare, Medicaid or managed care program needed by the patient, have an available bed and are willing to accept the patient.  Yes   Patient/family informed of [facility]'s ownership interest in [facility] and [facility], as well as of the fact that they are under no obligation to receive care at these facilities.  PASRR submitted to EDS on 03/02/16     PASRR number received on 03/02/16     Existing PASRR number confirmed on       FL2 transmitted to all facilities in geographic area requested by pt/family on 03/02/16     FL2 transmitted to all facilities within larger geographic area on       Patient informed that his/her managed care company has contracts with or will negotiate with certain facilities, including the following:        Yes   Patient/family informed of bed offers received.  Patient chooses bed at  ([facility] )     Physician recommends and patient chooses bed at      Patient to be transferred to  ([facility] ) on 03/03/16.  Patient [facility] facility [facility]     Patient family notified on 03/03/16 of transfer.  Name of family member notified:   (Patient's daughter Synetta Failnita is aware of D/C today. )     PHYSICIAN       Additional Comment:     _______________________________________________ Vernis Cabacungan, Darleen CrockerBailey M, LCSW 03/03/2016, 10:23 AM

## 2016-03-03 NOTE — Progress Notes (Signed)
Inpatient Diabetes Program Recommendations  AACE/ADA: New Consensus Statement on Inpatient Glycemic Control (2015)  Target Ranges:  Prepandial:   less than 140 mg/dL      [facility] postprandial:   less than 180 mg/dL (1-2 hours)      Critically ill patients:  140 - 180 mg/dL   Results for [name], Jevaeh M (MRN [id number]) as of 03/03/2016 14:48  Ref. Range 03/03/2016 07:20 03/03/2016 11:17 03/03/2016 14:30  Glucose-Capillary Latest Ref Range: 65 - 99 mg/dL 409156 (H) 811308 (H) 914283 (H)    Home DM Meds: Metformin 1000 mg bid                              Levemir 18 units daily  Current Insulin Orders: Levemir 20 units daily                                       Novolog Moderate Correction Scale/ SSI (0-15 units) TID AC + HS      Novolog 4 units tidwc     MD- Please consider increasing Novolog Meal Coverage to 6 units tid with meals (hold if pt eats <50% of meal)     --Will follow patient during hospitalization--  Ambrose FinlandJeannine Johnston Jalana Moore RN, MSN, CDE Diabetes Coordinator Inpatient Glycemic Control Team Team Pager: [phone] (8a-5p)

## 2016-03-03 NOTE — Progress Notes (Signed)
Physical Therapy Treatment Patient Details Name: Danielle Boyer MRN: [id number] DOB: 1940/10/29 Today's Date: 03/03/2016    History of Present Illness Pt is a 75 y.o. female s/p fall walking to bathroom and sustained  L femoral neck fx.  Pt s/p L hip hemiarthroplasty 03/01/16 with post hip precautions.  PMH significant for DM, htn, hearing loss, a-fib, paranoid schizophrenia, mild neurocognitive disorder.    PT Comments    Treatment attempted again and pt agreeable; sister present for part of session. Pt lethargic, stays awake for session. Pt does not answer any questioning except when asked if left hip painful after pt grimaced with passive heel slide; pt states "it hurts a whole lot". Pt unable to demonstrate any active movement bilaterally upon request/assisted attempt. Pt does tolerate passive range of motion. Fatigues by session's end, falling asleep with no interest in lunch despite efforts by sister/therapist. Pt has current discharge order to skilled nursing facility today  Follow Up Recommendations  SNF     Equipment Recommendations  Rolling walker with 5" wheels    Recommendations for Other Services       Precautions / Restrictions Precautions Precautions: Posterior Hip;Fall Restrictions Weight Bearing Restrictions: Yes LLE Weight Bearing: Weight bearing as tolerated    Mobility  Bed Mobility               General bed mobility comments: Not tested; pt lethargic and does not follow any instructions due to cognition  Transfers                    Ambulation/Gait                 Stairs            Wheelchair Mobility    Modified Rankin (Stroke Patients Only)       Balance                                    Cognition Arousal/Alertness: Lethargic Behavior During Therapy: Flat affect Overall Cognitive Status: History of cognitive impairments - at baseline (according to sister, pt has been this confused for a while)        Memory: Decreased recall of precautions;Decreased short-term memory              Exercises Total Joint Exercises Ankle Circles/Pumps: PROM;Both;20 reps;Supine Quad Sets: Other (comment) (Attempted, cannont comprehend) Short Arc Quad: PROM;Both;20 reps;Supine Heel Slides: PROM;Both;20 reps;Supine Hip ABduction/ADduction: PROM;Both;20 reps;Supine    General Comments        Pertinent Vitals/Pain Pain Assessment: Faces Faces Pain Scale: Hurts whole lot (States "it hurts alot" with movement) Pain Location: L hip    Home Living                      Prior Function            PT Goals (current goals can now be found in the care plan section) Progress towards PT goals: Not progressing toward goals - comment    Frequency  BID    PT Plan Current plan remains appropriate    Co-evaluation             End of Session   Activity Tolerance: Patient tolerated treatment well;Other (comment) (Notes pain with LLE passive HS part range; less so with SAQ) Patient left: in bed;with call [place]/phone within reach;with bed alarm set;with family/visitor present  Time: [id number] PT Time Calculation (min) (ACUTE ONLY): 24 min  Charges:  $Therapeutic Exercise: 23-37 mins                    G Codes:      Kristeen MissHeidi Elizabeth Bishop, PTA 03/03/2016, 12:18 PM

## 2016-03-03 NOTE — Discharge Instructions (Signed)
Heart healthy and ADA diet. °Activity as tolerated. °Fall and aspiration precaution. °

## 2016-03-03 NOTE — Discharge Summary (Signed)
[facility] [place] [facility]   PATIENT NAME: Danielle Boyer    MR#:  [id number]  DATE OF BIRTH:  [birth year]  DATE OF ADMISSION:  02/29/2016   ADMITTING PHYSICIAN: Shaune Pollack, MD  DATE OF DISCHARGE: 03/03/2016 PRIMARY CARE PHYSICIAN: SPARKS,JEFFREY D, MD   ADMISSION DIAGNOSIS:  UTI (lower urinary tract infection) [N39.0] Closed fracture of neck of left femur, initial encounter (HCC) [S72.002A] DISCHARGE DIAGNOSIS:  Active Problems:   Closed left hip fracture (HCC) UTI SECONDARY DIAGNOSIS:   Past Medical History:  Diagnosis Date  . CAD (coronary artery disease)   . Diabetes mellitus without complication (HCC)   . Hip fracture (HCC)   . Hypertension    HOSPITAL COURSE:   Left hip fracture. POD2. S/p hemiarthroplasty prosthesis. Pain control, DVT prophylaxis. resumed aspirin. PT/OT.  UTI. Treated with iv rocephin, urineculture: E Coli, sensitive to recephin and cipro. Change to cipro po for 3 more days.  CAD. resumed aspirin. Hypertension. Continue hypertension medication Diabetes. Hold metformin, increased levemir to 20 units HS and moderate sliding scale. Added novolog 4 unit AC. Anemia of chronic disease. Stable. Hypokalemia. Given KCl po.  DISCHARGE CONDITIONS:  Stable, discharge to SNF today. CONSULTS OBTAINED:  Treatment Team:  Juanell Fairly, MD DRUG ALLERGIES:   Allergies  Allergen Reactions  . Citalopram Other (See Comments)    Reaction:  Unknown   . Penicillins Rash and Other (See Comments)    Unable to obtain enough information to answer additional questions about this medication.  Has patient had a PCN reaction causing immediate rash, facial/tongue/throat swelling, SOB or lightheadedness with hypotension: Yes Has patient had a PCN reaction causing severe rash involving mucus membranes or skin necrosis: No Has patient had a PCN reaction that required hospitalization: unknown Has patient had a PCN reaction occurring within the  last 10 years: unknown If all of the above answers are "NO", t   DISCHARGE MEDICATIONS:     Medication List    STOP taking these medications   insulin aspart protamine- aspart (70-30) 100 UNIT/ML injection Commonly known as:  NOVOLOG MIX 70/30     TAKE these medications   acetaminophen 500 MG tablet Commonly known as:  TYLENOL Take 500 mg by mouth 3 (three) times daily. Take at 8 am, 2 pm, and 6 pm. *Not to exceed 3 gm/24hr*   amLODipine 10 MG tablet Commonly known as:  NORVASC Take 10 mg by mouth daily.   aspirin 81 MG chewable tablet Chew 1 tablet (81 mg total) by mouth daily.   atorvastatin 40 MG tablet Commonly known as:  LIPITOR Take 1 tablet (40 mg total) by mouth daily at 6 PM.   bisacodyl 5 MG EC tablet Commonly known as:  DULCOLAX Take 1 tablet (5 mg total) by mouth daily as needed for moderate constipation.   ciprofloxacin 500 MG tablet Commonly known as:  CIPRO Take 1 tablet (500 mg total) by mouth 2 (two) times daily.   cyanocobalamin 1000 MCG tablet Take 1 tablet (1,000 mcg total) by mouth daily.   docusate sodium 100 MG capsule Commonly known as:  COLACE Take 1 capsule (100 mg total) by mouth 2 (two) times daily.   enoxaparin 30 MG/0.3ML injection Commonly known as:  LOVENOX Inject 0.3 mLs (30 mg total) into the skin every 12 (twelve) hours. For 4-6 weeks, follow up Dr. Martha Clan to stop.   ferrous sulfate 325 (65 FE) MG tablet Take 1 tablet (325 mg total) by mouth 3 (three) times daily  after meals.   hydrALAZINE 25 MG tablet Commonly known as:  APRESOLINE Take 25 mg by mouth every 6 (six) hours as needed. For Systolic blood pressure over 190 or diastolic blood pressure over 90.   HYDROcodone-acetaminophen 5-325 MG tablet Commonly known as:  NORCO/VICODIN Take 1 tablet by mouth every 4 (four) hours as needed for moderate pain.   insulin detemir 100 UNIT/ML injection Commonly known as:  LEVEMIR Inject 0.2 mLs (20 Units total) into the skin  daily. What changed:  how much to take   levothyroxine 50 MCG tablet Commonly known as:  SYNTHROID, LEVOTHROID Take 1 tablet (50 mcg total) by mouth daily before breakfast.   LORazepam 0.5 MG tablet Commonly known as:  ATIVAN Take 1 tablet (0.5 mg total) by mouth 3 (three) times daily as needed for anxiety. And /or agitation.   Melatonin 3 MG Tabs Take 6 mg by mouth at bedtime.   metFORMIN 500 MG tablet Commonly known as:  GLUCOPHAGE Take 1,000 mg by mouth 2 (two) times daily with a meal.   metoprolol 50 MG tablet Commonly known as:  LOPRESSOR Take 50 mg by mouth 2 (two) times daily.   nystatin powder Generic drug:  nystatin Apply 1 g topically daily as needed. For rash.   OLANZapine 10 MG tablet Commonly known as:  ZYPREXA Take 1 tablet (10 mg total) by mouth at bedtime.   omeprazole 20 MG capsule Commonly known as:  PRILOSEC Take 20 mg by mouth daily.   traZODone 100 MG tablet Commonly known as:  DESYREL Take 100 mg by mouth at bedtime.        DISCHARGE INSTRUCTIONS:   DIET:  Heart healthy and ADA diet. DISCHARGE CONDITION:  stable. ACTIVITY:  As tolerated. DISCHARGE LOCATION:     If you experience worsening of your admission symptoms, develop shortness of breath, life threatening emergency, suicidal or homicidal thoughts you must seek medical attention immediately by calling 911 or calling your MD immediately  if symptoms less severe.  You Must read complete instructions/literature along with all the possible adverse reactions/side effects for all the Medicines you take and that have been prescribed to you. Take any new Medicines after you have completely understood and accpet all the possible adverse reactions/side effects.   Please note  You were cared for by a hospitalist during your hospital stay. If you have any questions about your discharge medications or the care you received while you were in the hospital after you are discharged, you can call  the unit and asked to speak with the hospitalist on call if the hospitalist that took care of you is not available. Once you are discharged, your primary care physician will handle any further medical issues. Please note that NO REFILLS for any discharge medications will be authorized once you are discharged, as it is imperative that you return to your primary care physician (or establish a relationship with a primary care physician if you do not have one) for your aftercare needs so that they can reassess your need for medications and monitor your lab values.    On the day of Discharge:  VITAL SIGNS:  Blood pressure (!) 137/42, pulse 92, temperature 99.6 F (37.6 C), temperature source Oral, resp. rate 16, height 5\' 4"  (1.626 m), weight 149 lb 4.8 oz (67.7 kg), SpO2 93 %. PHYSICAL EXAMINATION:  GENERAL:  75 y.o.-year-old patient lying in the bed with no acute distress.  EYES: Pupils equal, round, reactive to light and accommodation. No scleral  icterus. Extraocular muscles intact.  HEENT: Head atraumatic, normocephalic. Oropharynx and nasopharynx clear.  NECK:  Supple, no jugular venous distention. No thyroid enlargement, no tenderness.  LUNGS: Normal breath sounds bilaterally, no wheezing, rales,rhonchi or crepitation. No use of accessory muscles of respiration.  CARDIOVASCULAR: S1, S2 normal. No murmurs, rubs, or gallops.  ABDOMEN: Soft, non-tender, non-distended. Bowel sounds present. No organomegaly or mass.  EXTREMITIES: No pedal edema, cyanosis, or clubbing.  NEUROLOGIC: Cranial nerves II through XII are intact. Muscle strength 3-4/5 in all extremities. Sensation intact. Gait not checked.  PSYCHIATRIC: The patient is demented.  SKIN: No obvious rash, lesion, or ulcer.  DATA REVIEW:   CBC  Recent Labs Lab 03/03/16 0503  WBC 10.9  HGB 9.0*  HCT 26.5*  PLT 232    Chemistries   Recent Labs Lab 03/03/16 0503  NA 139  K 3.3*  CL 107  CO2 24  GLUCOSE 150*  BUN 10    CREATININE 0.56  CALCIUM 8.2*  MG 1.9     Microbiology Results  Results for orders placed or performed during the hospital encounter of 02/29/16  Urine culture     Status: Abnormal   Collection Time: 02/29/16  6:28 AM  Result Value Ref Range Status   Specimen Description URINE, RANDOM  Final   Special Requests NONE  Final   Culture >=100,000 COLONIES/mL ESCHERICHIA COLI (A)  Final   Report Status 03/02/2016 FINAL  Final   Organism ID, Bacteria ESCHERICHIA COLI (A)  Final      Susceptibility   Escherichia coli - MIC*    AMPICILLIN >=32 RESISTANT Resistant     CEFAZOLIN <=4 SENSITIVE Sensitive     CEFTRIAXONE <=1 SENSITIVE Sensitive     CIPROFLOXACIN <=0.25 SENSITIVE Sensitive     GENTAMICIN <=1 SENSITIVE Sensitive     IMIPENEM <=0.25 SENSITIVE Sensitive     NITROFURANTOIN <=16 SENSITIVE Sensitive     TRIMETH/SULFA <=20 SENSITIVE Sensitive     AMPICILLIN/SULBACTAM 16 INTERMEDIATE Intermediate     PIP/TAZO <=4 SENSITIVE Sensitive     Extended ESBL NEGATIVE Sensitive     * >=100,000 COLONIES/mL ESCHERICHIA COLI  Surgical PCR screen     Status: None   Collection Time: 02/29/16 10:46 AM  Result Value Ref Range Status   MRSA, PCR NEGATIVE NEGATIVE Final   Staphylococcus aureus NEGATIVE NEGATIVE Final    Comment:        The Xpert SA Assay (FDA approved for NASAL specimens in patients over 77 years of age), is one component of a comprehensive surveillance program.  Test performance has been validated by [facility] for patients greater than or equal to 77 year old. It is not intended to diagnose infection nor to guide or monitor treatment.     RADIOLOGY:  No results found.   Management plans discussed with the patient, family and they are in agreement.  CODE STATUS:     Code Status Orders        Start     Ordered   02/29/16 1509  Full code  Continuous     02/29/16 1514    Code Status History    Date Active Date Inactive Code Status Order ID Comments User  Context   02/29/2016  9:19 AM 02/29/2016  3:14 PM Full Code [id number]  Shaune PollackQing Cason Dabney, MD Inpatient   06/06/2015  1:46 PM 06/12/2015  7:16 PM Full Code [id number]  Audery AmelJohn T Clapacs, MD Inpatient    Advance Directive Documentation   Flowsheet Row Most Recent  Value  Type of Advance Directive  Healthcare Power of Attorney  Pre-existing out of facility DNR order (yellow form or pink MOST form)  No data  "MOST" Form in Place?  No data      TOTAL TIME TAKING CARE OF THIS PATIENT: 35 minutes.    Shaune Pollack M.D on 03/03/2016 at 9:28 AM  Between 7am to 6pm - Pager - [phone]  After 6pm go to [email or web address] - Social research officer, government  [facility]  Office  [phone]  CC: Primary care physician; Marguarite Arbour, MD   Note: This dictation was prepared with Dragon dictation along with smaller phrase technology. Any transcriptional errors that result from this process are unintentional.

## 2016-03-03 NOTE — Progress Notes (Signed)
PT Cancellation Note  Patient Details Name: Danielle Boyer MRN: [id number] DOB: 08/13/1940   Cancelled Treatment:    Reason Eval/Treat Not Completed: Other (comment)Treatment attempted 2 times this morning; pt's visitor notes pt requires tending too for personal hygiene/bed change; second attempt staff continues with pt. Will attempt again prior to pt's discharge today to skilled nursing facility.    Kristeen MissHeidi Elizabeth Bishop, VirginiaPTA 03/03/2016, 11:36 AM

## 2016-03-03 NOTE — Progress Notes (Signed)
Patient is medically stable for D/C to [facility] today. Per Jomarie LongsJoseph [facility] liaison patient will go to room 703. RN will call report to Konrad DoloresKim Hicks RN at [phone] and arrange EMS for transport. Clinical Child psychotherapistocial Worker (CSW) sent D/C orders to [employer] via [employer]. CSW contacted patient's daughter Jarrett Sohontia and made her aware of above. Please reconsult if future social work needs arise. CSW signing off.   Baker Hughes IncorporatedBailey Sakura Denis, LCSW [phone]

## 2016-03-03 NOTE — Progress Notes (Signed)
Spoke with Charlcie CradleMarge, [employer] rep at [phone], to notify of non-emergent EMS transport.  Auth notification reference given as [id number].   Service date range good from 03/03/16 - 06/01/16.   Geographical gap exception requested to determine if services can be considered at an in-network level.

## 2016-03-15 IMAGING — CT CT HEAD W/O CM
1 series · 16 of 30 positions shown, 20 images · non-contrast
Comparison: Head CT 04/24/2010 and MRI brain 07/17/2014

CLINICAL DATA: Fell yesterday due to dizziness.

EXAM:
CT HEAD WITHOUT CONTRAST
TECHNIQUE: Contiguous axial images were obtained from the base of the skull
through the vertex without intravenous contrast.

[Series 2: head wo · axial · 0.43mm/px · z∈[+552,+696]mm · 16 of 36 slices shown, 20 images]
[im 2/36  brain]
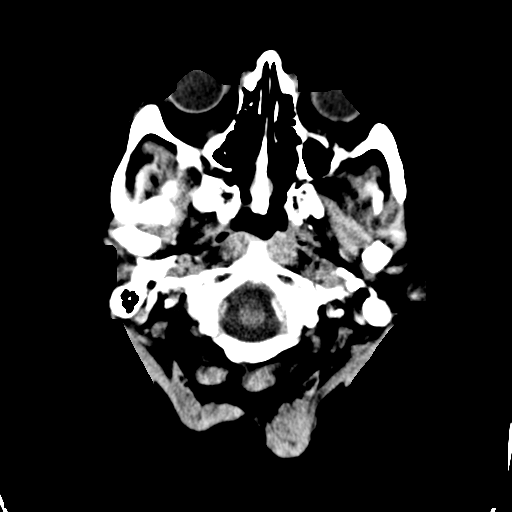
[im 2/36  bone]
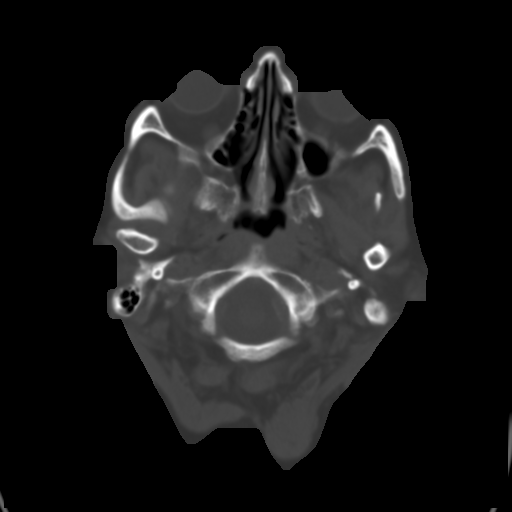
[im 4/36  brain]
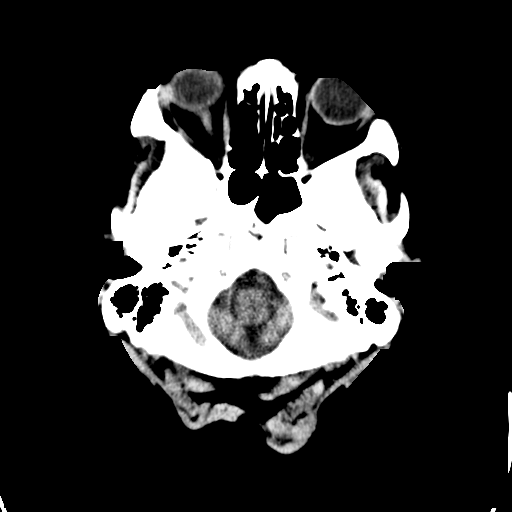
[im 7/36  brain]
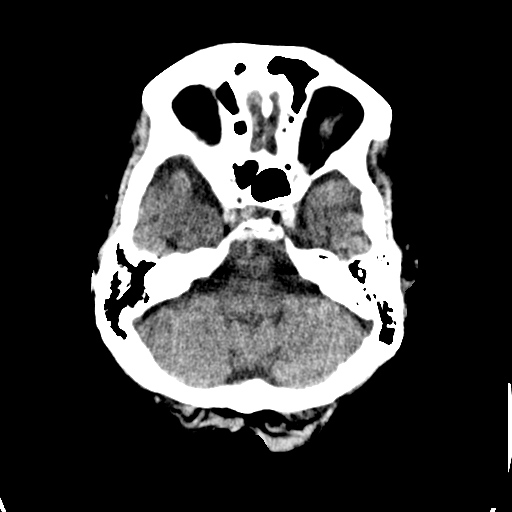
[im 9/36  brain]
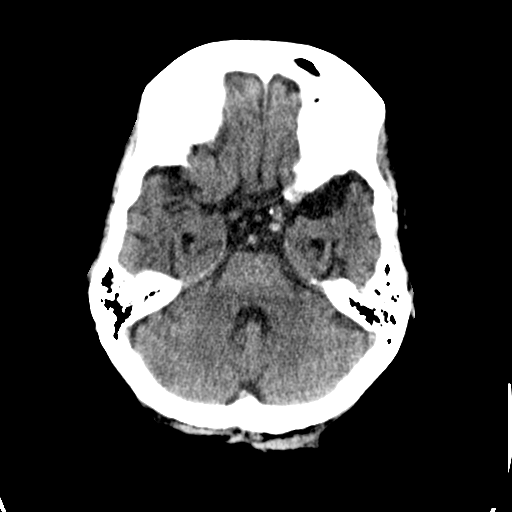
[im 10/36  brain]
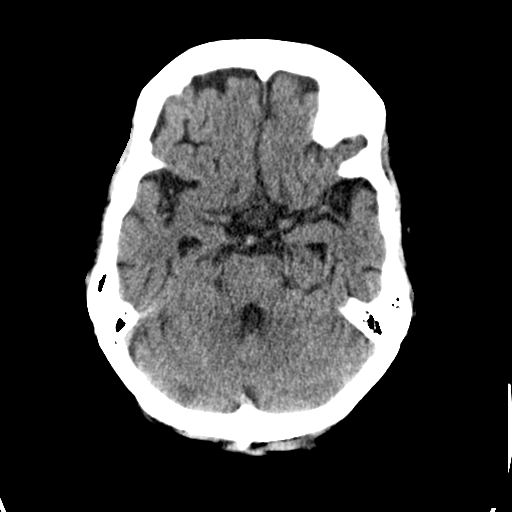
[im 10/36  bone]
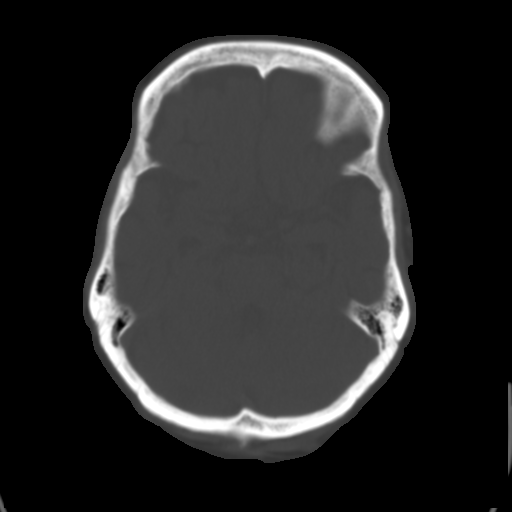
[im 13/36  brain]
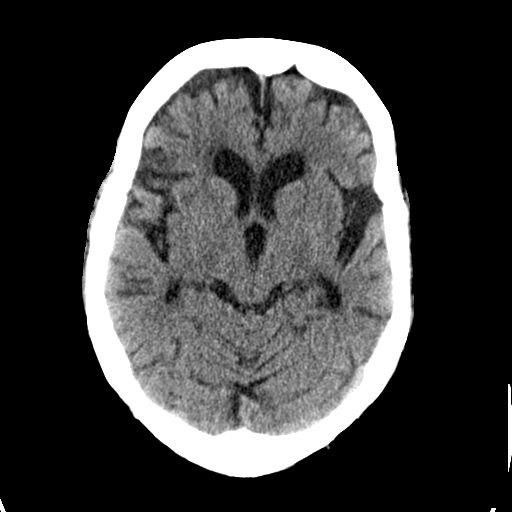
[im 15/36  brain]
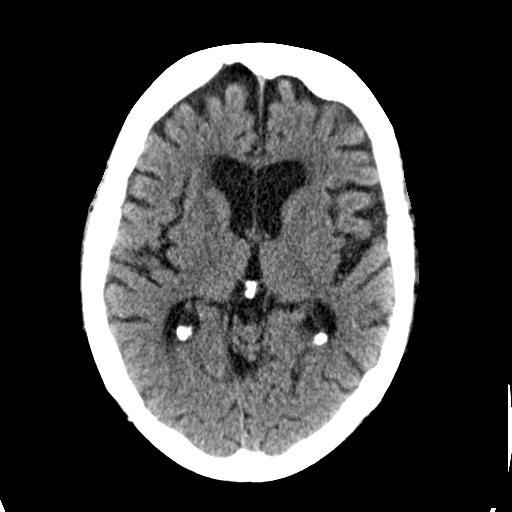
[im 17/36  brain]
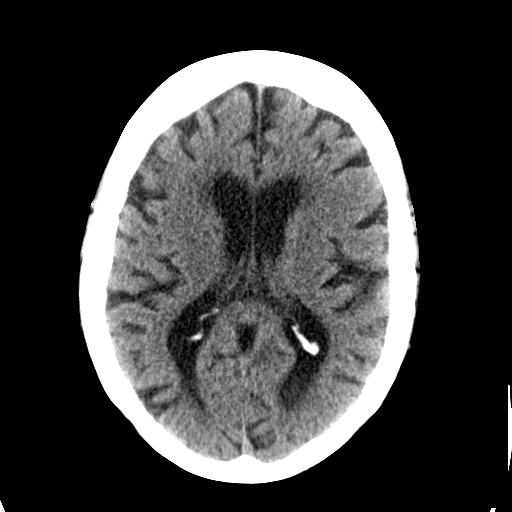
[im 19/36  brain]
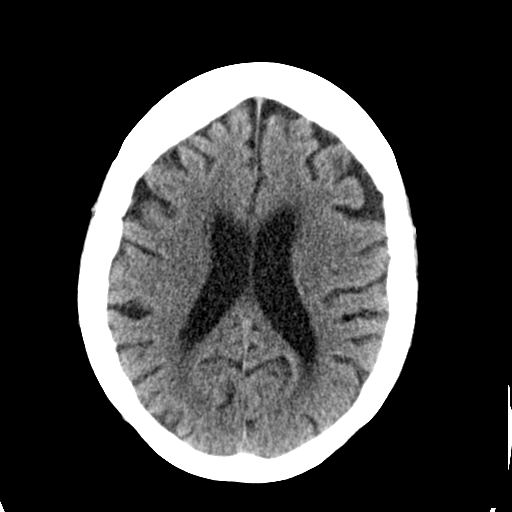
[im 19/36  bone]
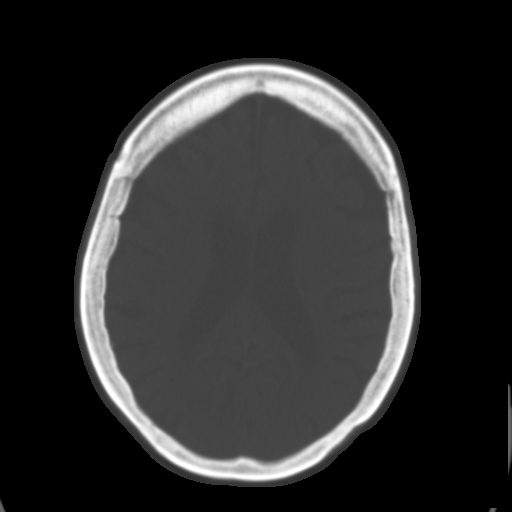
[im 21/36  brain]
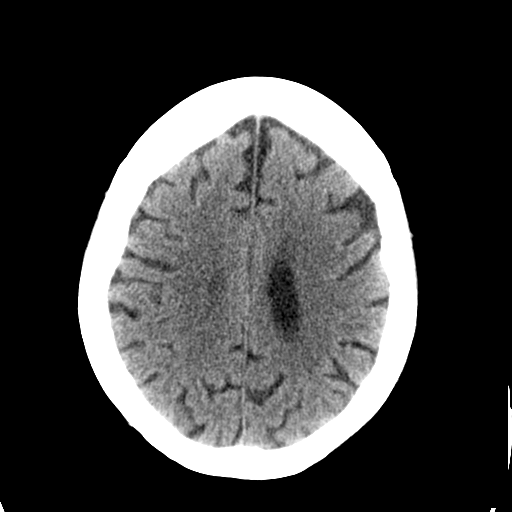
[im 23/36  brain]
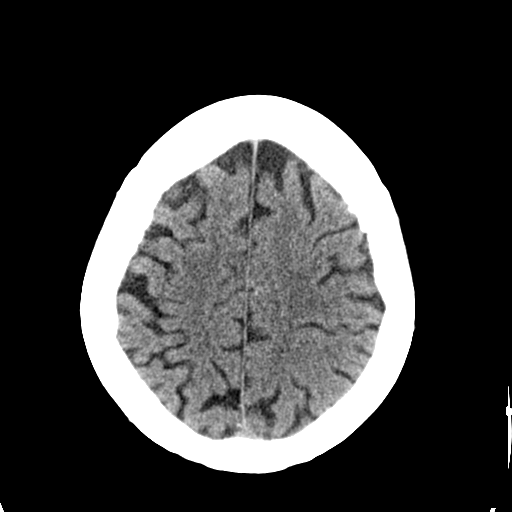
[im 26/36  brain]
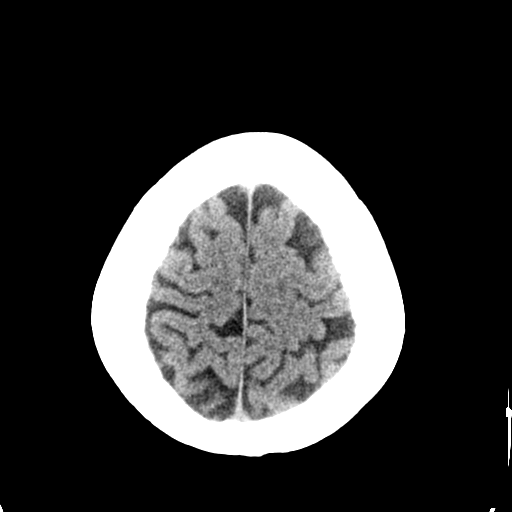
[im 27/36  brain]
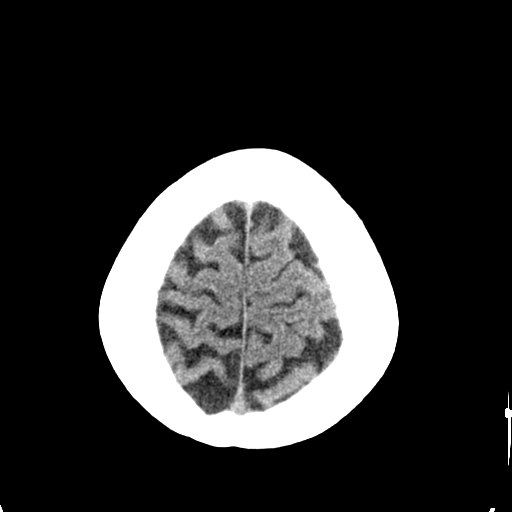
[im 27/36  bone]
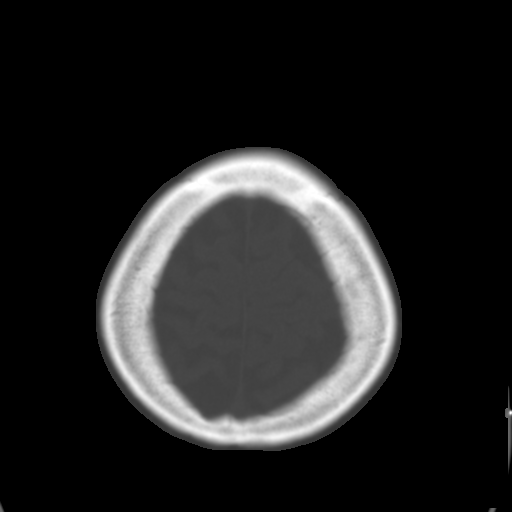
[im 29/36  brain]
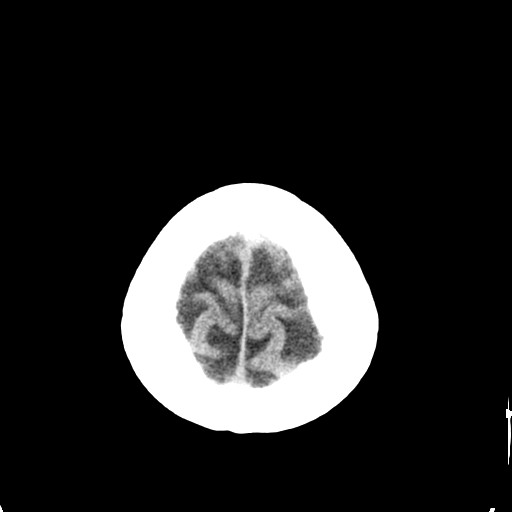
[im 32/36  brain]
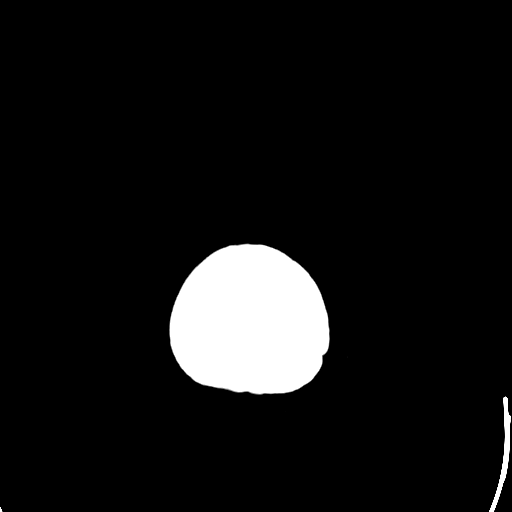
[im 34/36  brain]
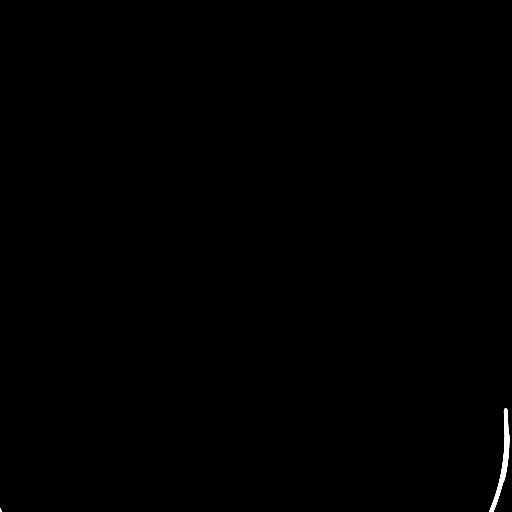

[16 of 30 positions shown; findings below may reference images not displayed]

FINDINGS: Stable age related cerebral atrophy, ventriculomegaly and
periventricular white matter disease. No extra-axial fluid
collections are identified. No CT findings for acute hemispheric
infarction or intracranial hemorrhage. No mass lesions. Stable
dilated perivascular space versus remote lacunar infarct right basal
ganglia area. The brainstem and cerebellum are normal.

The bony structures are intact. No acute skull fracture or bone
lesion. The paranasal sinuses and mastoid air cells clear. The
globes are intact. Stable vascular calcifications.
IMPRESSION: Stable age related cerebral atrophy, ventriculomegaly and
periventricular white matter disease.

No acute intracranial findings or skull fracture.

## 2016-03-15 IMAGING — CR DG LUMBAR SPINE 2-3V
3 series · 3 of 3 positions shown · non-contrast
Comparison: Prior CT abdomen/ pelvis 02/09/2011

CLINICAL DATA: 74-year-old female with lumbar spine pain following
a fall

EXAM:
LUMBAR SPINE - 2-3 VIEW

[l-spine ap]
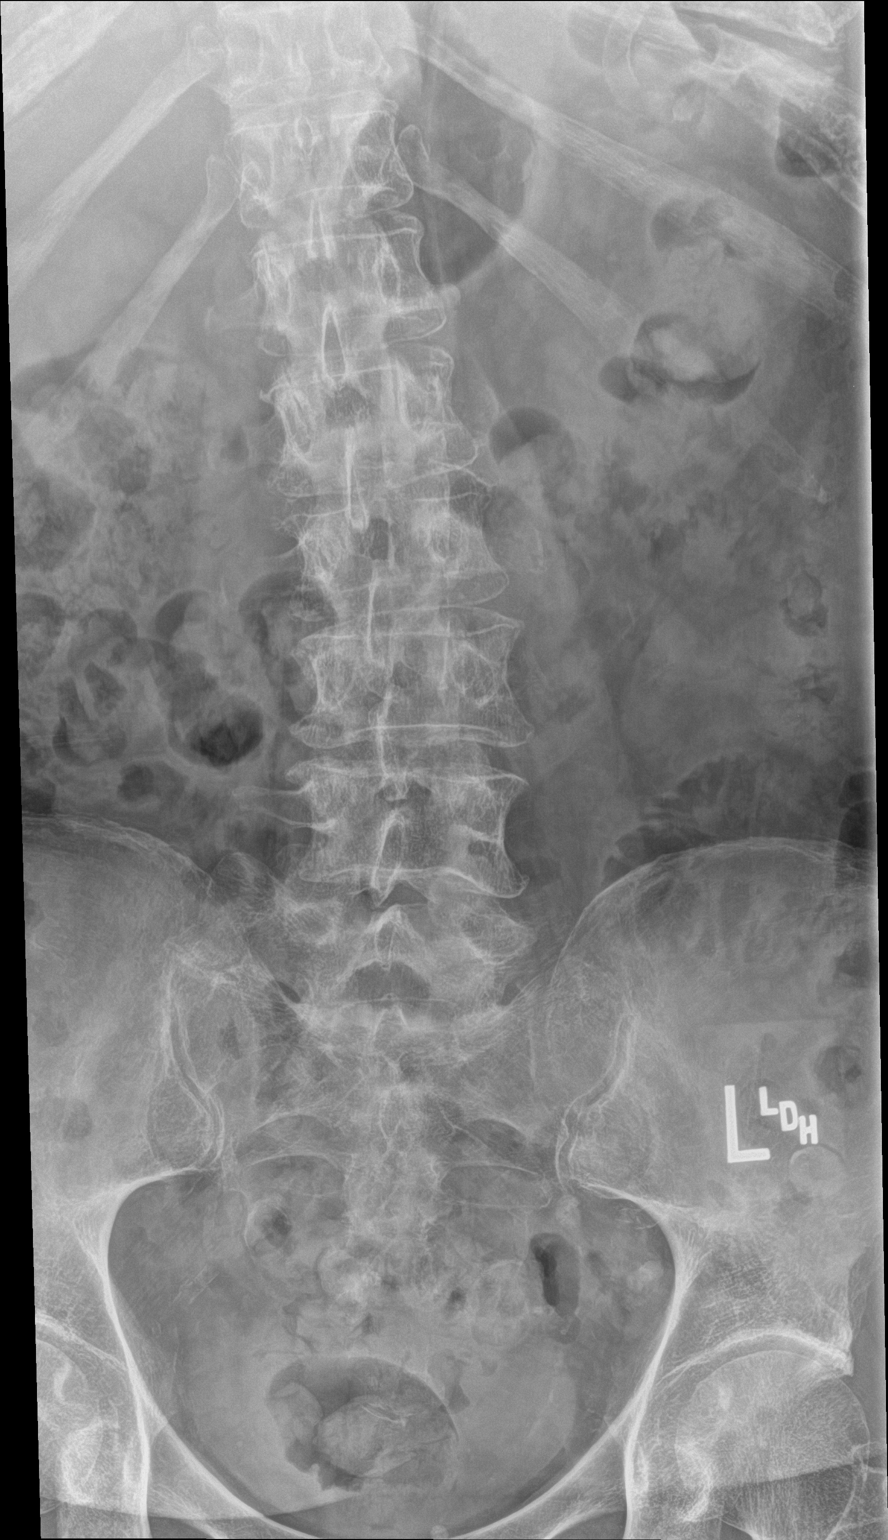

[l-spine lat]
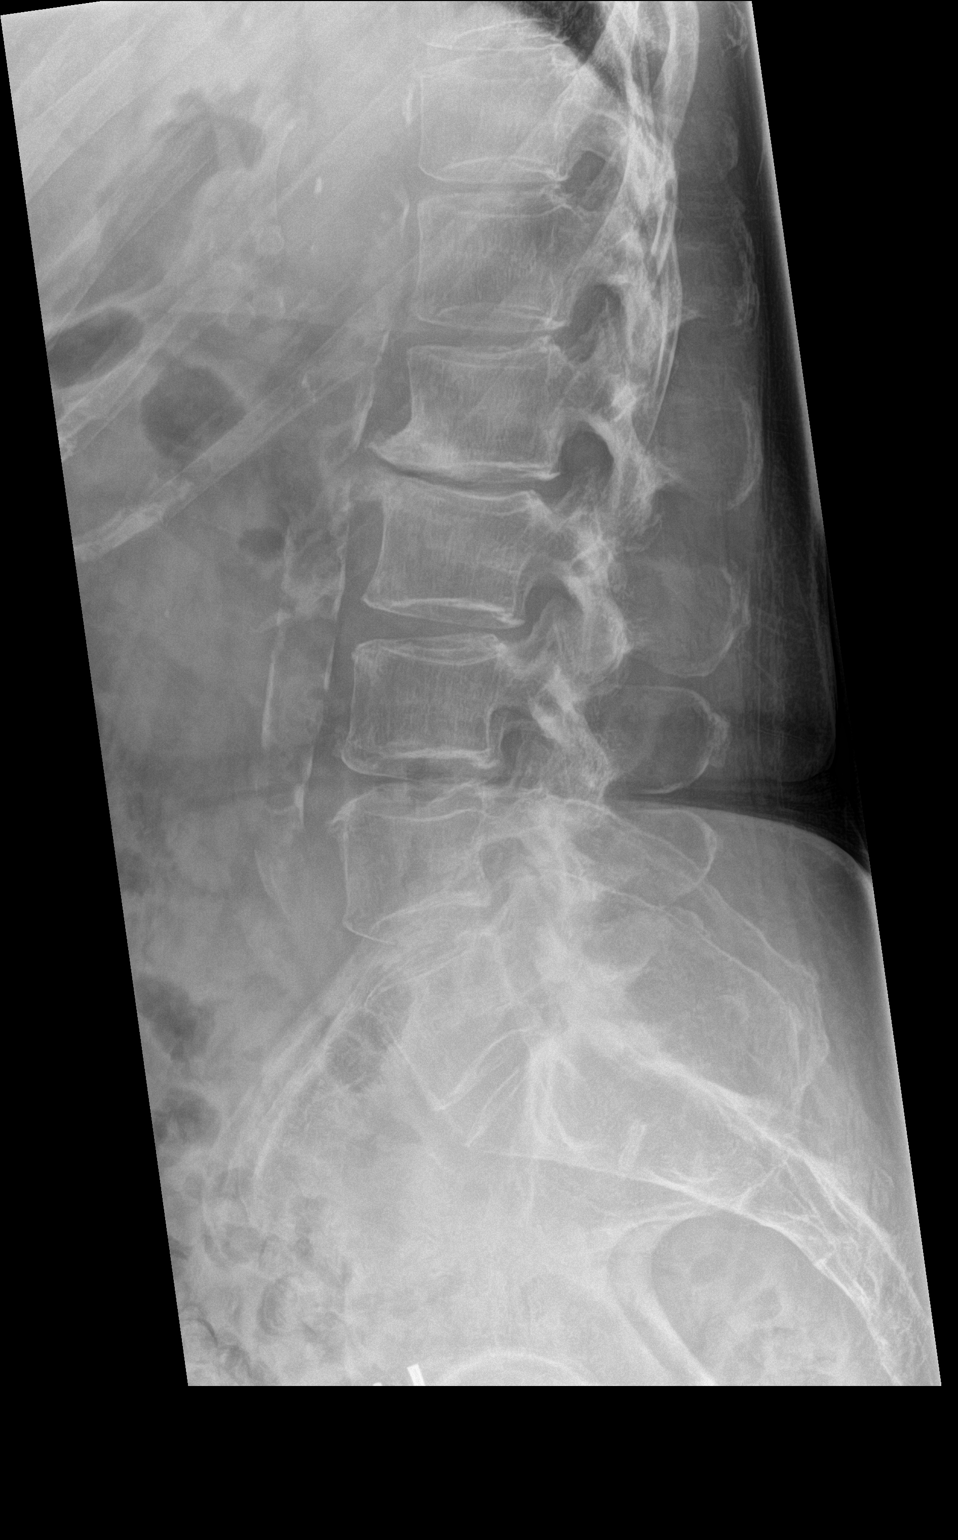

[l-spine spot]
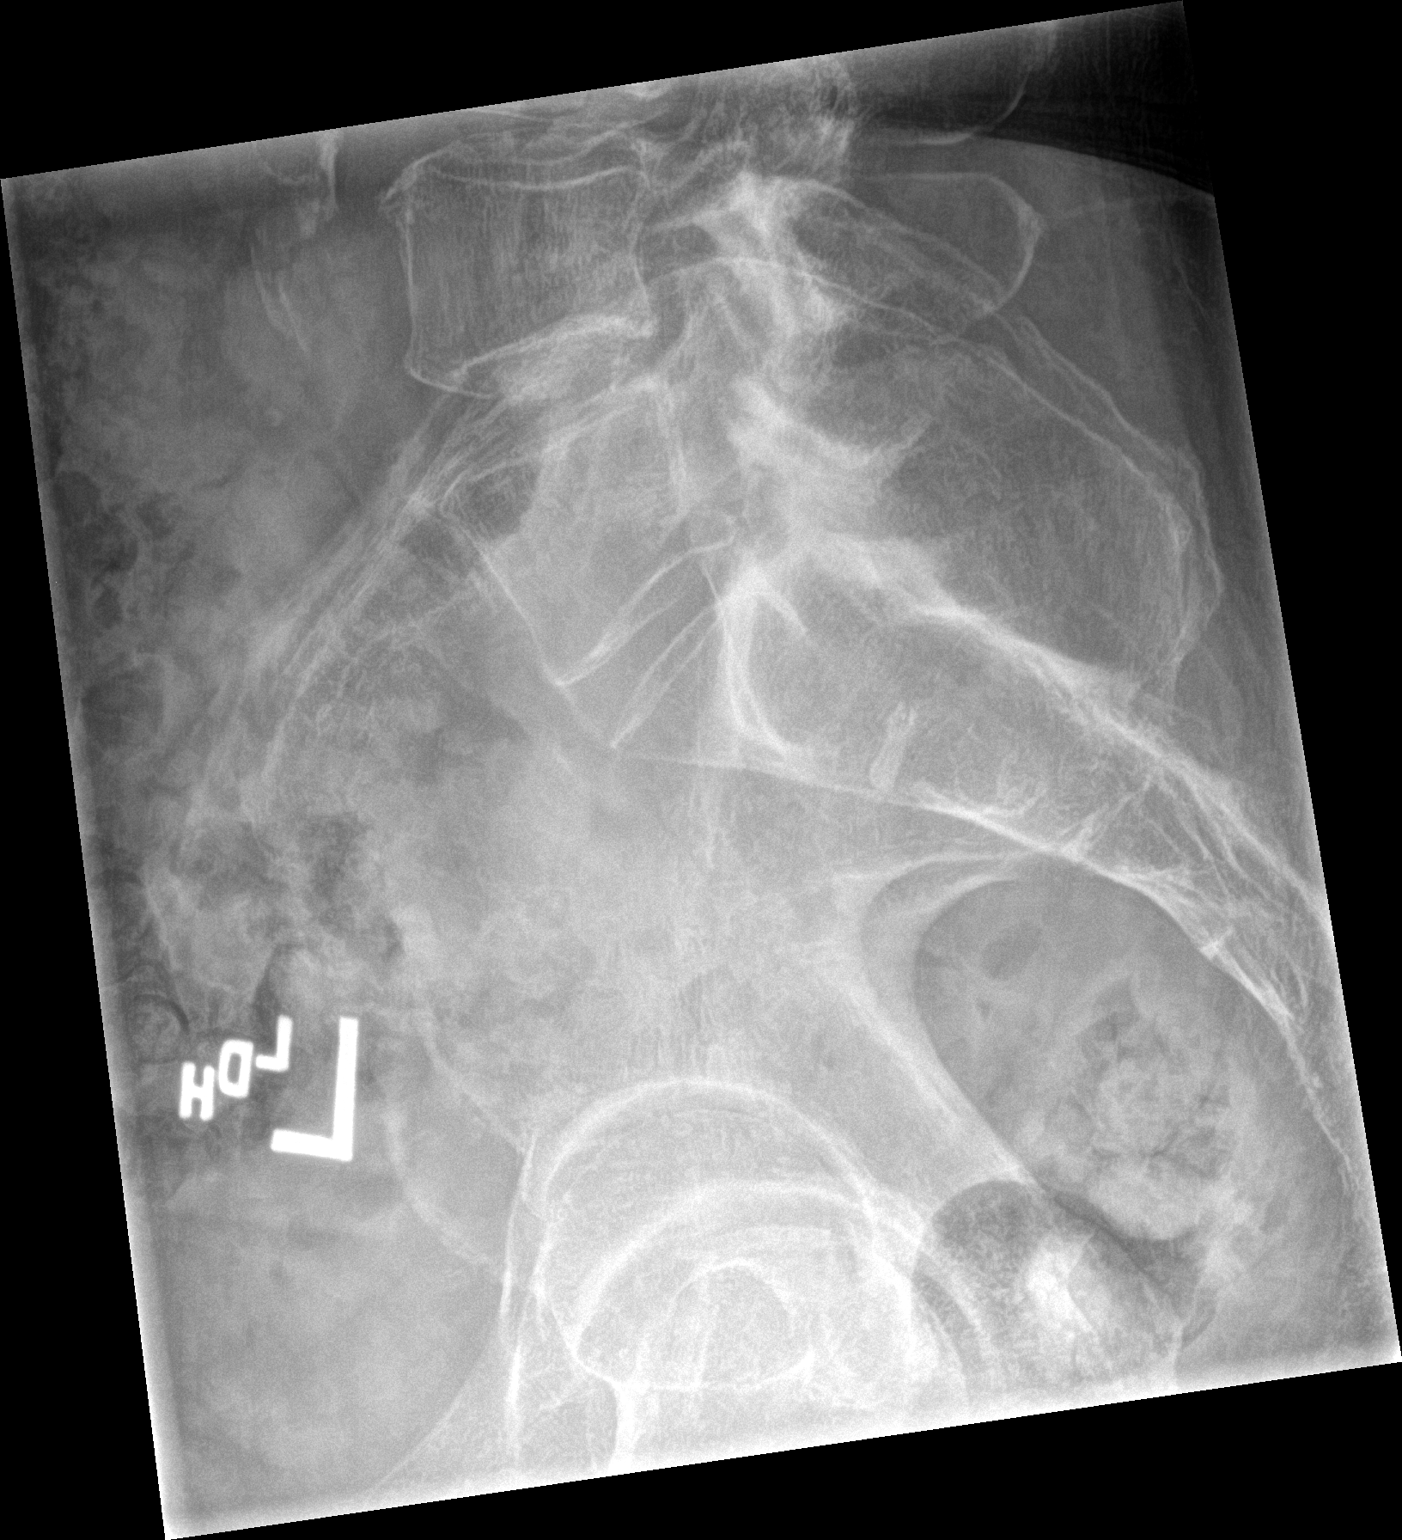

[3 of 3 positions shown; findings below may reference images not displayed]

FINDINGS: There is no evidence of acute fracture or malalignment. Vertebral
body heights are maintained. There is levoconvex and slightly rotary
scoliosis centered at L3-L4. Transitional anatomy is noted with
partial lumbarization of the S1 vertebral body. Multilevel
degenerative disc disease most notable at L2-L3. Atherosclerotic
calcifications noted in the abdominal aorta. No visualized aneurysm.
Normal bony mineralization without lytic or blastic osseous lesion.
The bowel gas pattern is unremarkable.
IMPRESSION: 1. No acute fracture or malalignment.
2. Transitional anatomy with partial lumbarization of the S1
vertebral body.
3. Levoconvex and slightly rotary scoliosis centered at L3-L4.
4. Mild multilevel degenerative disc disease.
5. Aortic atherosclerosis.

## 2016-05-31 ENCOUNTER — Emergency Department: Payer: Medicare Other

## 2016-05-31 ENCOUNTER — Emergency Department
Admission: EM | Admit: 2016-05-31 | Discharge: 2016-05-31 | Disposition: A | Discharge status: Home / Self Care | Payer: Medicare Other | Attending: Student in an Organized Health Care Education/Training Program | Admitting: Student in an Organized Health Care Education/Training Program

## 2016-05-31 ENCOUNTER — Encounter: Payer: Self-pay | Admitting: Emergency Medicine

## 2016-05-31 DIAGNOSIS — Y929 Unspecified place or not applicable: Secondary | ICD-10-CM | POA: Insufficient documentation

## 2016-05-31 DIAGNOSIS — I10 Essential (primary) hypertension: Secondary | ICD-10-CM | POA: Diagnosis not present

## 2016-05-31 DIAGNOSIS — Z79899 Other long term (current) drug therapy: Secondary | ICD-10-CM | POA: Insufficient documentation

## 2016-05-31 DIAGNOSIS — Z7901 Long term (current) use of anticoagulants: Secondary | ICD-10-CM | POA: Diagnosis not present

## 2016-05-31 DIAGNOSIS — E039 Hypothyroidism, unspecified: Secondary | ICD-10-CM | POA: Diagnosis not present

## 2016-05-31 DIAGNOSIS — Y999 Unspecified external cause status: Secondary | ICD-10-CM | POA: Insufficient documentation

## 2016-05-31 DIAGNOSIS — Y9389 Activity, other specified: Secondary | ICD-10-CM | POA: Insufficient documentation

## 2016-05-31 DIAGNOSIS — I2510 Atherosclerotic heart disease of native coronary artery without angina pectoris: Secondary | ICD-10-CM | POA: Diagnosis not present

## 2016-05-31 DIAGNOSIS — E109 Type 1 diabetes mellitus without complications: Secondary | ICD-10-CM | POA: Diagnosis not present

## 2016-05-31 DIAGNOSIS — S8992XA Unspecified injury of left lower leg, initial encounter: Secondary | ICD-10-CM | POA: Diagnosis present

## 2016-05-31 DIAGNOSIS — W010XXA Fall on same level from slipping, tripping and stumbling without subsequent striking against object, initial encounter: Secondary | ICD-10-CM | POA: Insufficient documentation

## 2016-05-31 DIAGNOSIS — S82832A Other fracture of upper and lower end of left fibula, initial encounter for closed fracture: Secondary | ICD-10-CM | POA: Diagnosis not present

## 2016-05-31 DIAGNOSIS — G9389 Other specified disorders of brain: Secondary | ICD-10-CM | POA: Insufficient documentation

## 2016-05-31 DIAGNOSIS — W19XXXA Unspecified fall, initial encounter: Secondary | ICD-10-CM

## 2016-05-31 HISTORY — DX: Gastro-esophageal reflux disease without esophagitis: K21.9

## 2016-05-31 HISTORY — DX: Disorder of thyroid, unspecified: E07.9

## 2016-05-31 HISTORY — DX: Hypokalemia: E87.6

## 2016-05-31 HISTORY — DX: Schizophrenia, unspecified: F20.9

## 2016-05-31 HISTORY — DX: Unspecified dementia, unspecified severity, without behavioral disturbance, psychotic disturbance, mood disturbance, and anxiety: F03.90

## 2016-05-31 LAB — URINALYSIS COMPLETE WITH MICROSCOPIC (ARMC ONLY)
BACTERIA UA: NONE SEEN
Bilirubin Urine: NEGATIVE
Glucose, UA: NEGATIVE mg/dL
HGB URINE DIPSTICK: NEGATIVE
Ketones, ur: NEGATIVE mg/dL
LEUKOCYTES UA: NEGATIVE
NITRITE: NEGATIVE
PH: 5 (ref 5.0–8.0)
PROTEIN: NEGATIVE mg/dL
SPECIFIC GRAVITY, URINE: 1.012 (ref 1.005–1.030)
SQUAMOUS EPITHELIAL / LPF: NONE SEEN

## 2016-05-31 LAB — CBC WITH DIFFERENTIAL/PLATELET
BASOS ABS: 0 10*3/uL (ref 0–0.1)
BASOS PCT: 0 %
EOS ABS: 0.3 10*3/uL (ref 0–0.7)
EOS PCT: 4 %
HCT: 36.4 % (ref 35.0–47.0)
Hemoglobin: 11.7 g/dL — ABNORMAL LOW (ref 12.0–16.0)
Lymphocytes Relative: 19 %
Lymphs Abs: 1.3 10*3/uL (ref 1.0–3.6)
MCH: 28.9 pg (ref 26.0–34.0)
MCHC: 32.2 g/dL (ref 32.0–36.0)
MCV: 89.9 fL (ref 80.0–100.0)
Monocytes Absolute: 0.4 10*3/uL (ref 0.2–0.9)
Monocytes Relative: 6 %
Neutro Abs: 4.7 10*3/uL (ref 1.4–6.5)
Neutrophils Relative %: 71 %
PLATELETS: 246 10*3/uL (ref 150–440)
RBC: 4.05 MIL/uL (ref 3.80–5.20)
RDW: 15.1 % — ABNORMAL HIGH (ref 11.5–14.5)
WBC: 6.8 10*3/uL (ref 3.6–11.0)

## 2016-05-31 LAB — COMPREHENSIVE METABOLIC PANEL
ALBUMIN: 3.7 g/dL (ref 3.5–5.0)
ALT: 13 U/L — AB (ref 14–54)
AST: 20 U/L (ref 15–41)
Alkaline Phosphatase: 56 U/L (ref 38–126)
Anion gap: 7 (ref 5–15)
BUN: 9 mg/dL (ref 6–20)
CHLORIDE: 110 mmol/L (ref 101–111)
CO2: 27 mmol/L (ref 22–32)
CREATININE: 0.64 mg/dL (ref 0.44–1.00)
Calcium: 9.2 mg/dL (ref 8.9–10.3)
GFR calc non Af Amer: >60 mL/min (ref >60)
GLUCOSE: 111 mg/dL — AB (ref 65–99)
Potassium: 4 mmol/L (ref 3.5–5.1)
SODIUM: 144 mmol/L (ref 135–145)
Total Bilirubin: 0.7 mg/dL (ref 0.3–1.2)
Total Protein: 6.7 g/dL (ref 6.5–8.1)

## 2016-05-31 MED ORDER — ASPIRIN 81 MG PO CHEW: 324.0000 mg | CHEWABLE_TABLET | Freq: Every day | ORAL | 0 refills | Status: AC

## 2016-05-31 NOTE — ED Provider Notes (Signed)
[facility] Emergency Department Provider Note    First MD Initiated Contact with Patient 05/31/16 [phone]     (approximate)  I have reviewed the triage vital signs and the nursing notes.   HISTORY  Chief Complaint Fall    HPI Danielle Boyer is a 75 y.o. female who is status post recent of left hemiarthroplasty presents after mechanical fall from standing from assisted living. Patient's list be ambulating with walker. States that she was trying to get some urinary dressing was not using her walker and slipped on the floor. Landed on her left hip. States that she rolled her left ankle. Is uncertain as to whether she hit her head or not. Denies any other chest upper extremity or abdominal pain. She's been able able to ambulate since the fall. She was on Lovenox postop but is not on any other blood thinners   Past Medical History:  Diagnosis Date  . CAD (coronary artery disease)   . Dementia   . 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 GERD (gastroesophageal reflux disease)   . Hip fracture (HCC)   . Hypertension   . Hypokalemia   . Schizophrenia (HCC)   . Thyroid disease    hypo   Family History  Problem Relation Age of Onset  . Breast cancer Sister    Past Surgical History:  Procedure Laterality Date  . ABDOMINAL HYSTERECTOMY    . CESAREAN SECTION     x3  . HIP ARTHROPLASTY Left 03/01/2016   Procedure: ARTHROPLASTY BIPOLAR HIP (HEMIARTHROPLASTY);  Surgeon: Juanell FairlyKevin Krasinski, MD;  Location: [facility] ORS;  Service: Orthopedics;  Laterality: Left;   Patient Active Problem List   Diagnosis Date Noted  . Closed left hip fracture (HCC) 02/29/2016  . 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 Essential hypertension 06/07/2015  . Hearing loss 06/07/2015  . Hypothyroidism 06/07/2015  . Cardiovascular disease 06/07/2015  . Atrial fibrillation (HCC) 06/07/2015  . GERD (gastroesophageal reflux disease) 06/07/2015  . IBS (irritable bowel  syndrome) 06/07/2015  . Osteoarthritis 06/07/2015  . Paranoid schizophrenia (HCC)   . Schizophrenia (HCC) 06/06/2015  . Diabetes (HCC) 03/19/2015      Prior to Admission medications   Medication Sig Start Date End Date Taking? Authorizing Provider  acetaminophen (TYLENOL) 500 MG tablet Take 500 mg by mouth 3 (three) times daily. Take at 8 am, 2 pm, and 6 pm. *Not to exceed 3 gm/24hr*    Historical Provider, MD  amLODipine (NORVASC) 10 MG tablet Take 10 mg by mouth daily.    Historical Provider, MD  aspirin 81 MG chewable tablet Chew 1 tablet (81 mg total) by mouth daily. 06/11/15   Jimmy FootmanAndrea Hernandez-Gonzalez, MD  atorvastatin (LIPITOR) 40 MG tablet Take 1 tablet (40 mg total) by mouth daily at 6 PM. Patient not taking: Reported on 02/29/2016 06/11/15   Jimmy FootmanAndrea Hernandez-Gonzalez, MD  bisacodyl (DULCOLAX) 5 MG EC tablet Take 1 tablet (5 mg total) by mouth daily as needed for moderate constipation. 03/03/16   Shaune PollackQing Chen, MD  cyanocobalamin 1000 MCG tablet Take 1 tablet (1,000 mcg total) by mouth daily. Patient not taking: Reported on 02/29/2016 06/11/15   Jimmy FootmanAndrea Hernandez-Gonzalez, MD  docusate sodium (COLACE) 100 MG capsule Take 1 capsule (100 mg total) by mouth 2 (two) times daily. 03/03/16   Shaune PollackQing Chen, MD  enoxaparin (LOVENOX) 30 MG/0.3ML injection Inject 0.3 mLs (30 mg total) into the skin every 12 (twelve) hours. For 4-6 weeks, follow up Dr. Martha ClanKrasinski to  stop. 03/03/16 03/31/16  Shaune PollackQing Chen, MD  ferrous sulfate 325 (65 FE) MG tablet Take 1 tablet (325 mg total) by mouth 3 (three) times daily after meals. 03/03/16   Shaune PollackQing Chen, MD  hydrALAZINE (APRESOLINE) 25 MG tablet Take 25 mg by mouth every 6 (six) hours as needed. For Systolic blood pressure over 190 or diastolic blood pressure over 90.    Historical Provider, MD  HYDROcodone-acetaminophen (NORCO/VICODIN) 5-325 MG tablet Take 1 tablet by mouth every 4 (four) hours as needed for moderate pain. 03/03/16   Shaune PollackQing Chen, MD  insulin detemir (LEVEMIR) 100  UNIT/ML injection Inject 0.2 mLs (20 Units total) into the skin daily. 03/03/16   Shaune PollackQing Chen, MD  levothyroxine (SYNTHROID, LEVOTHROID) 50 MCG tablet Take 1 tablet (50 mcg total) by mouth daily before breakfast. 06/11/15   Jimmy FootmanAndrea Hernandez-Gonzalez, MD  LORazepam (ATIVAN) 0.5 MG tablet Take 1 tablet (0.5 mg total) by mouth 3 (three) times daily as needed for anxiety. And /or agitation. 03/03/16   Shaune PollackQing Chen, MD  Melatonin 3 MG TABS Take 6 mg by mouth at bedtime.    Historical Provider, MD  metFORMIN (GLUCOPHAGE) 500 MG tablet Take 1,000 mg by mouth 2 (two) times daily with a meal.    Historical Provider, MD  metoprolol (LOPRESSOR) 50 MG tablet Take 50 mg by mouth 2 (two) times daily.    Historical Provider, MD  nystatin (NYSTATIN) powder Apply 1 g topically daily as needed. For rash.    Historical Provider, MD  OLANZapine (ZYPREXA) 10 MG tablet Take 1 tablet (10 mg total) by mouth at bedtime. 06/04/15   Audery AmelJohn T Clapacs, MD  omeprazole (PRILOSEC) 20 MG capsule Take 20 mg by mouth daily.     Historical Provider, MD  traZODone (DESYREL) 100 MG tablet Take 100 mg by mouth at bedtime.    Historical Provider, MD    Allergies Citalopram and Penicillins    Social History Social History  Substance Use Topics  . Smoking status: Never Smoker  . Smokeless tobacco: Never Used  . Alcohol use No    Review of Systems Patient denies headaches, rhinorrhea, blurry vision, numbness, shortness of breath, chest pain, edema, cough, abdominal pain, nausea, vomiting, diarrhea, dysuria, fevers, rashes or hallucinations unless otherwise stated above in HPI. ____________________________________________   PHYSICAL EXAM:  VITAL SIGNS: Vitals:   05/31/16 0721  BP: 124/66  Pulse: 76  Resp: 20  Temp: 97.9 F (36.6 C)    Constitutional: Alert and oriented. in no acute distress. Eyes: Conjunctivae are normal. PERRL. EOMI. Head: Atraumatic. Nose: No congestion/rhinnorhea. Mouth/Throat: Mucous membranes are  moist.  Oropharynx non-erythematous. Neck: No stridor. Painless ROM. No cervical spine tenderness to palpation Hematological/Lymphatic/Immunilogical: No cervical lymphadenopathy. Cardiovascular: Normal rate, regular rhythm. Grossly normal heart sounds.  Good peripheral circulation. Respiratory: Normal respiratory effort.  No retractions. Lungs CTAB. Gastrointestinal: Soft and nontender. No distention. No abdominal bruits. No CVA tenderness. Musculoskeletal: ttp of left ankle without effusion or ecchymosis, no pain with flexion or log roll of left hipNo joint effusions. Neurologic:  Normal speech and language. No gross focal neurologic deficits are appreciated. No gait instability. Skin:  Skin is warm, dry and intact. No rash noted. Psychiatric: Mood and affect are normal. Speech and behavior are normal.  ____________________________________________   LABS (all labs ordered are listed, but only abnormal results are displayed)  Results for orders placed or performed during the hospital encounter of 05/31/16 (from the past 24 hour(s))  CBC with Differential/Platelet     Status: Abnormal  Collection Time: 05/31/16  7:30 AM  Result Value Ref Range   WBC 6.8 3.6 - 11.0 K/uL   RBC 4.05 3.80 - 5.20 MIL/uL   Hemoglobin 11.7 (L) 12.0 - 16.0 g/dL   HCT 16.1 09.6 - 04.5 %   MCV 89.9 80.0 - 100.0 fL   MCH 28.9 26.0 - 34.0 pg   MCHC 32.2 32.0 - 36.0 g/dL   RDW 40.9 (H) 81.1 - 91.4 %   Platelets 246 150 - 440 K/uL   Neutrophils Relative % 71 %   Neutro Abs 4.7 1.4 - 6.5 K/uL   Lymphocytes Relative 19 %   Lymphs Abs 1.3 1.0 - 3.6 K/uL   Monocytes Relative 6 %   Monocytes Absolute 0.4 0.2 - 0.9 K/uL   Eosinophils Relative 4 %   Eosinophils Absolute 0.3 0 - 0.7 K/uL   Basophils Relative 0 %   Basophils Absolute 0.0 0 - 0.1 K/uL  Comprehensive metabolic panel     Status: Abnormal   Collection Time: 05/31/16  7:30 AM  Result Value Ref Range   Sodium 144 135 - 145 mmol/L   Potassium 4.0 3.5 -  5.1 mmol/L   Chloride 110 101 - 111 mmol/L   CO2 27 22 - 32 mmol/L   Glucose, Bld 111 (H) 65 - 99 mg/dL   BUN 9 6 - 20 mg/dL   Creatinine, Ser 7.82 0.44 - 1.00 mg/dL   Calcium 9.2 8.9 - 95.6 mg/dL   Total Protein 6.7 6.5 - 8.1 g/dL   Albumin 3.7 3.5 - 5.0 g/dL   AST 20 15 - 41 U/L   ALT 13 (L) 14 - 54 U/L   Alkaline Phosphatase 56 38 - 126 U/L   Total Bilirubin 0.7 0.3 - 1.2 mg/dL   GFR calc non Af Amer >60 >60 mL/min   GFR calc Af Amer >60 >60 mL/min   Anion gap 7 5 - 15   ____________________________________________  EKG My review and personal interpretation at Time: 7:38   Indication: fall  Rate: 75  Rhythm: sinus Axis: normal Other: non specific st changes, no acute ischemia, normal intervals ____________________________________________  RADIOLOGY I personally reviewed all radiographic images ordered to evaluate for the above acute complaints and reviewed radiology reports and findings.  These findings were personally discussed with the patient.  Please see medical record for radiology report.  ____________________________________________   PROCEDURES  Procedure(s) performed: none ORTHOPEDIC INJURY TREATMENT Date/Time: 05/31/2016 8:50 AM Performed by: Willy Eddy Authorized by: Willy Eddy  Consent: Verbal consent obtained. Consent given by: patient Injury location: lower leg Location details: left lower leg Injury type: fracture Fracture type: lateral malleolus Pre-procedure neurovascular assessment: neurovascularly intact Pre-procedure distal perfusion: normal Pre-procedure neurological function: normal Pre-procedure range of motion: normal Manipulation performed: no Immobilization: splint Splint type: short leg Supplies used: Ortho-Glass Post-procedure neurovascular assessment: post-procedure neurovascularly intact Patient tolerance: Patient tolerated the procedure well with no immediate complications       Critical Care performed:  no ____________________________________________   INITIAL IMPRESSION / ASSESSMENT AND PLAN / ED COURSE  Pertinent labs & imaging results that were available during my care of the patient were reviewed by me and considered in my medical decision making (see chart for details).  DDX: sah, sdh, iph, fracture, dislocation, contusion, uti  Danielle Boyer is a 75 y.o. who presents to the ED with mechanical fall from standing.  Patient is AFVSS in ED. Exam as above. Given current presentation have considered the above differential. CT  imaging of the head ordered to evaluate for any acute traumatic injury. Will order radiographic imaging of left hip or left ankle due to acute pain. We will order laboratory evaluation to evaluate for any underlying electrolyte abnormality.  The patient will be placed on continuous pulse oximetry and telemetry for monitoring.  Laboratory evaluation will be sent to evaluate for the above complaints.     Clinical Course as of Jun 01 1711  Wynelle Link May 31, 2016  1610 Patient with evidence of acute left distal fibula fracture without displacement. CT head without any evidence of acute traumatic injury. Blood work is reassuring. Patient placed in a short leg splint. Patient will be placed at nonweightbearing and wheelchair with follow-up for orthopedics. Patient will be started on 4 days aspirin for DVT prophylaxis. Do not want to start patient on Lovenox due to increased fall risk as she has underlying dementia.    [PR]  0930 No evidence of UTI. Patient remains hemodynamic stable.  Tolerated spotting without any palpitation. Has access to wheelchair at home. We'll transport back to the assisted living facility with follow-up with Dr. Hyacinth Meeker.  Have discussed with the patient and available family all diagnostics and treatments performed thus far and all questions were answered to the best of my ability. The patient demonstrates understanding and agreement with plan.   [PR]    Clinical  Course User Index [PR] Willy Eddy, MD     ____________________________________________   FINAL CLINICAL IMPRESSION(S) / ED DIAGNOSES  Final diagnoses:  Other closed fracture of distal end of left fibula, initial encounter  Fall from standing, initial encounter      NEW MEDICATIONS STARTED DURING THIS VISIT:  New Prescriptions   No medications on file     Note:  This document was prepared using Dragon voice recognition software and may include unintentional dictation errors.    Willy Eddy, MD 05/31/16 [phone]

## 2016-05-31 NOTE — ED Triage Notes (Signed)
Pt presents to ED via [facility] from [facility] c/o fall. Pt states she was wearing slippery shoes and felt her feet go out from under her while ambulating. Denies LOC, denies head involvement. Denies blood thinners. C/o pain to L hip and L ankle. Mild confusion, hx dementia.

## 2016-05-31 NOTE — ED Notes (Addendum)
Pt unable to urinate at this time. Arrived to ED soaked with urine. Will reassess. All wet clothes removed from patient and placed into belongings bag.

## 2016-05-31 NOTE — Discharge Instructions (Signed)
You have been seen in the emergency department for emergency care. It is important that you contact your own doctor, specialist or the closest clinic for follow-up care. Please bring this instruction sheet, all medications and X-ray copies with you when you are seen for follow-up care.  - Return to Emergency Department immediately if you injured extremity is cool, pale, blue, becomes extremely swollen, painful or if your splint/cast feels too tight.  - Rest and elevate the affected painful area. Apply ice compresses 20 minutes on and then off for 20 minutes as needed. Do not place ice directly on skin to prevent frostbite.  - As pain recedes, begin normal activities slowly as tolerated.  - Do not bear weight on leg.  Use wheelchair for mobility until cleared by orthopedics.

## 2016-09-01 ENCOUNTER — Emergency Department: Payer: Medicare Other

## 2016-09-01 ENCOUNTER — Inpatient Hospital Stay
Admission: EM | Admit: 2016-09-01 | Discharge: 2016-09-05 | DRG: 871 | Disposition: A | Discharge status: Skilled Nursing Facility | Payer: Medicare Other | Attending: Specialist | Admitting: Specialist

## 2016-09-01 ENCOUNTER — Encounter: Payer: Self-pay | Admitting: Emergency Medicine

## 2016-09-01 DIAGNOSIS — E039 Hypothyroidism, unspecified: Secondary | ICD-10-CM | POA: Diagnosis present

## 2016-09-01 DIAGNOSIS — Z880 Allergy status to penicillin: Secondary | ICD-10-CM | POA: Diagnosis not present

## 2016-09-01 DIAGNOSIS — A419 Sepsis, unspecified organism: Principal | ICD-10-CM | POA: Diagnosis present

## 2016-09-01 DIAGNOSIS — H9190 Unspecified hearing loss, unspecified ear: Secondary | ICD-10-CM | POA: Diagnosis present

## 2016-09-01 DIAGNOSIS — F209 Schizophrenia, unspecified: Secondary | ICD-10-CM | POA: Diagnosis present

## 2016-09-01 DIAGNOSIS — Z23 Encounter for immunization: Secondary | ICD-10-CM | POA: Diagnosis present

## 2016-09-01 DIAGNOSIS — Z90710 Acquired absence of both cervix and uterus: Secondary | ICD-10-CM

## 2016-09-01 DIAGNOSIS — Z881 Allergy status to other antibiotic agents status: Secondary | ICD-10-CM | POA: Diagnosis not present

## 2016-09-01 DIAGNOSIS — I2510 Atherosclerotic heart disease of native coronary artery without angina pectoris: Secondary | ICD-10-CM | POA: Diagnosis present

## 2016-09-01 DIAGNOSIS — J189 Pneumonia, unspecified organism: Secondary | ICD-10-CM | POA: Diagnosis present

## 2016-09-01 DIAGNOSIS — I10 Essential (primary) hypertension: Secondary | ICD-10-CM | POA: Diagnosis present

## 2016-09-01 DIAGNOSIS — Z7984 Long term (current) use of oral hypoglycemic drugs: Secondary | ICD-10-CM | POA: Diagnosis not present

## 2016-09-01 DIAGNOSIS — R000 Tachycardia, unspecified: Secondary | ICD-10-CM | POA: Diagnosis present

## 2016-09-01 DIAGNOSIS — Z7982 Long term (current) use of aspirin: Secondary | ICD-10-CM

## 2016-09-01 DIAGNOSIS — Z96642 Presence of left artificial hip joint: Secondary | ICD-10-CM | POA: Diagnosis present

## 2016-09-01 DIAGNOSIS — K219 Gastro-esophageal reflux disease without esophagitis: Secondary | ICD-10-CM | POA: Diagnosis present

## 2016-09-01 DIAGNOSIS — E1165 Type 2 diabetes mellitus with hyperglycemia: Secondary | ICD-10-CM | POA: Diagnosis present

## 2016-09-01 DIAGNOSIS — Z794 Long term (current) use of insulin: Secondary | ICD-10-CM | POA: Diagnosis not present

## 2016-09-01 DIAGNOSIS — F0390 Unspecified dementia without behavioral disturbance: Secondary | ICD-10-CM | POA: Diagnosis present

## 2016-09-01 DIAGNOSIS — Y95 Nosocomial condition: Secondary | ICD-10-CM | POA: Diagnosis not present

## 2016-09-01 DIAGNOSIS — F200 Paranoid schizophrenia: Secondary | ICD-10-CM | POA: Diagnosis present

## 2016-09-01 DIAGNOSIS — E785 Hyperlipidemia, unspecified: Secondary | ICD-10-CM | POA: Diagnosis present

## 2016-09-01 DIAGNOSIS — Z803 Family history of malignant neoplasm of breast: Secondary | ICD-10-CM

## 2016-09-01 DIAGNOSIS — E876 Hypokalemia: Secondary | ICD-10-CM | POA: Diagnosis present

## 2016-09-01 DIAGNOSIS — E119 Type 2 diabetes mellitus without complications: Secondary | ICD-10-CM

## 2016-09-01 LAB — COMPREHENSIVE METABOLIC PANEL WITH GFR
ALT: 14 U/L (ref 14–54)
AST: 30 U/L (ref 15–41)
Albumin: 3.6 g/dL (ref 3.5–5.0)
Alkaline Phosphatase: 53 U/L (ref 38–126)
Anion gap: 9 (ref 5–15)
BUN: 20 mg/dL (ref 6–20)
CO2: 23 mmol/L (ref 22–32)
Calcium: 8.7 mg/dL — ABNORMAL LOW (ref 8.9–10.3)
Chloride: 108 mmol/L (ref 101–111)
Creatinine, Ser: 0.65 mg/dL (ref 0.44–1.00)
GFR calc Af Amer: >60 mL/min
GFR calc non Af Amer: >60 mL/min
Glucose, Bld: 162 mg/dL — ABNORMAL HIGH (ref 65–99)
Potassium: 3.7 mmol/L (ref 3.5–5.1)
Sodium: 140 mmol/L (ref 135–145)
Total Bilirubin: 0.4 mg/dL (ref 0.3–1.2)
Total Protein: 6.5 g/dL (ref 6.5–8.1)

## 2016-09-01 LAB — CBC
HCT: 33.5 % — ABNORMAL LOW (ref 35.0–47.0)
Hemoglobin: 10.9 g/dL — ABNORMAL LOW (ref 12.0–16.0)
MCH: 31.3 pg (ref 26.0–34.0)
MCHC: 32.6 g/dL (ref 32.0–36.0)
MCV: 96 fL (ref 80.0–100.0)
Platelets: 240 K/uL (ref 150–440)
RBC: 3.49 MIL/uL — ABNORMAL LOW (ref 3.80–5.20)
RDW: 16.4 % — ABNORMAL HIGH (ref 11.5–14.5)
WBC: 13 K/uL — ABNORMAL HIGH (ref 3.6–11.0)

## 2016-09-01 LAB — TSH: TSH: 2.349 u[IU]/mL (ref 0.350–4.500)

## 2016-09-01 LAB — GLUCOSE, CAPILLARY
Glucose-Capillary: 158 mg/dL — ABNORMAL HIGH (ref 65–99)
Glucose-Capillary: 138 mg/dL — ABNORMAL HIGH (ref 65–99)
Glucose-Capillary: 197 mg/dL — ABNORMAL HIGH (ref 65–99)

## 2016-09-01 LAB — URINALYSIS, COMPLETE (UACMP) WITH MICROSCOPIC
BILIRUBIN URINE: NEGATIVE
Bacteria, UA: NONE SEEN
Glucose, UA: NEGATIVE mg/dL
KETONES UR: NEGATIVE mg/dL
Nitrite: NEGATIVE
PH: 5 (ref 5.0–8.0)
Protein, ur: NEGATIVE mg/dL
Specific Gravity, Urine: 1.024 (ref 1.005–1.030)

## 2016-09-01 LAB — INFLUENZA PANEL BY PCR (TYPE A & B)
INFLBPCR: NEGATIVE
Influenza A By PCR: NEGATIVE

## 2016-09-01 LAB — LACTIC ACID, PLASMA
Lactic Acid, Venous: 3.5 mmol/L (ref 0.5–1.9)
Lactic Acid, Venous: 3.5 mmol/L (ref 0.5–1.9)

## 2016-09-01 LAB — TROPONIN I: Troponin I: <0.03 ng/mL (ref <0.03)

## 2016-09-01 LAB — MRSA PCR SCREENING: MRSA BY PCR: NEGATIVE

## 2016-09-01 MED ORDER — SODIUM CHLORIDE 0.9 % IV SOLN
INTRAVENOUS | Status: DC
  Administered 2016-09-01 – 2016-09-02 (×3): via INTRAVENOUS

## 2016-09-01 MED ORDER — VANCOMYCIN HCL IN DEXTROSE 750-5 MG/150ML-% IV SOLN
750.0000 mg | INTRAVENOUS | Status: DC
  Filled 2016-09-01: qty 150

## 2016-09-01 MED ORDER — PNEUMOCOCCAL VAC POLYVALENT 25 MCG/0.5ML IJ INJ
0.5000 mL | INJECTION | INTRAMUSCULAR | Status: AC
  Administered 2016-09-02: 0.5 mL via INTRAMUSCULAR
  Filled 2016-09-01: qty 0.5

## 2016-09-01 MED ORDER — HALOPERIDOL LACTATE 5 MG/ML IJ SOLN: 1.0000 mg | Freq: Four times a day (QID) | INTRAMUSCULAR | Status: DC | PRN

## 2016-09-01 MED ORDER — NYSTATIN 100000 UNIT/GM EX POWD
1.0000 g | Freq: Every day | CUTANEOUS | Status: DC | PRN
  Filled 2016-09-01: qty 15

## 2016-09-01 MED ORDER — FERROUS SULFATE 325 (65 FE) MG PO TABS
325.0000 mg | ORAL_TABLET | Freq: Three times a day (TID) | ORAL | Status: DC
  Administered 2016-09-01 – 2016-09-04 (×12): 325 mg via ORAL
  Filled 2016-09-01 (×13): qty 1

## 2016-09-01 MED ORDER — ONDANSETRON HCL 4 MG/2ML IJ SOLN: 4.0000 mg | Freq: Four times a day (QID) | INTRAMUSCULAR | Status: DC | PRN

## 2016-09-01 MED ORDER — SODIUM CHLORIDE 0.9 % IV BOLUS (SEPSIS)
1000.0000 mL | Freq: Once | INTRAVENOUS | Status: AC
  Administered 2016-09-01: 1000 mL via INTRAVENOUS

## 2016-09-01 MED ORDER — VANCOMYCIN HCL IN DEXTROSE 1-5 GM/200ML-% IV SOLN
1000.0000 mg | Freq: Once | INTRAVENOUS | Status: AC
  Administered 2016-09-01: 1000 mg via INTRAVENOUS
  Filled 2016-09-01: qty 200

## 2016-09-01 MED ORDER — TRAZODONE HCL 100 MG PO TABS
100.0000 mg | ORAL_TABLET | Freq: Every day | ORAL | Status: DC
  Administered 2016-09-01 – 2016-09-03 (×3): 100 mg via ORAL
  Filled 2016-09-01 (×3): qty 1

## 2016-09-01 MED ORDER — METOPROLOL TARTRATE 50 MG PO TABS
50.0000 mg | ORAL_TABLET | Freq: Two times a day (BID) | ORAL | Status: DC
  Administered 2016-09-01 – 2016-09-04 (×7): 50 mg via ORAL
  Filled 2016-09-01 (×8): qty 1

## 2016-09-01 MED ORDER — ACETAMINOPHEN 650 MG RE SUPP: 650.0000 mg | Freq: Four times a day (QID) | RECTAL | Status: DC | PRN

## 2016-09-01 MED ORDER — INSULIN ASPART 100 UNIT/ML ~~LOC~~ SOLN
0.0000 [IU] | Freq: Three times a day (TID) | SUBCUTANEOUS | Status: DC
  Administered 2016-09-01: 2 [IU] via SUBCUTANEOUS
  Administered 2016-09-01: 1 [IU] via SUBCUTANEOUS
  Administered 2016-09-02 (×2): 2 [IU] via SUBCUTANEOUS
  Administered 2016-09-03: 5 [IU] via SUBCUTANEOUS
  Administered 2016-09-04: 12:00:00 3 [IU] via SUBCUTANEOUS
  Administered 2016-09-04 (×2): 1 [IU] via SUBCUTANEOUS
  Filled 2016-09-01: qty 2
  Filled 2016-09-01 (×2): qty 1
  Filled 2016-09-01: qty 5
  Filled 2016-09-01 (×2): qty 2
  Filled 2016-09-01: qty 3
  Filled 2016-09-01: qty 1

## 2016-09-01 MED ORDER — ONDANSETRON HCL 4 MG PO TABS: 4.0000 mg | ORAL_TABLET | Freq: Four times a day (QID) | ORAL | Status: DC | PRN

## 2016-09-01 MED ORDER — SODIUM CHLORIDE 0.9 % IV BOLUS (SEPSIS): 1000.0000 mL | Freq: Once | INTRAVENOUS | Status: DC

## 2016-09-01 MED ORDER — ENOXAPARIN SODIUM 40 MG/0.4ML ~~LOC~~ SOLN
40.0000 mg | SUBCUTANEOUS | Status: DC
  Administered 2016-09-01 – 2016-09-04 (×4): 40 mg via SUBCUTANEOUS
  Filled 2016-09-01 (×4): qty 0.4

## 2016-09-01 MED ORDER — HYDROCODONE-ACETAMINOPHEN 5-325 MG PO TABS
1.0000 | ORAL_TABLET | ORAL | Status: DC | PRN
  Administered 2016-09-04: 1 via ORAL
  Filled 2016-09-01: qty 1

## 2016-09-01 MED ORDER — VITAMIN B-12 1000 MCG PO TABS
1000.0000 ug | ORAL_TABLET | Freq: Every day | ORAL | Status: DC
  Administered 2016-09-01 – 2016-09-04 (×4): 1000 ug via ORAL
  Filled 2016-09-01 (×4): qty 1

## 2016-09-01 MED ORDER — LORAZEPAM 0.5 MG PO TABS
0.5000 mg | ORAL_TABLET | Freq: Three times a day (TID) | ORAL | Status: DC | PRN
  Administered 2016-09-04: 18:00:00 0.5 mg via ORAL
  Filled 2016-09-01: qty 1

## 2016-09-01 MED ORDER — ATORVASTATIN CALCIUM 20 MG PO TABS
40.0000 mg | ORAL_TABLET | Freq: Every day | ORAL | Status: DC
  Administered 2016-09-01 – 2016-09-04 (×4): 40 mg via ORAL
  Filled 2016-09-01 (×4): qty 2

## 2016-09-01 MED ORDER — INSULIN DETEMIR 100 UNIT/ML ~~LOC~~ SOLN
12.0000 [IU] | Freq: Every day | SUBCUTANEOUS | Status: DC
  Administered 2016-09-01 – 2016-09-03 (×3): 12 [IU] via SUBCUTANEOUS
  Filled 2016-09-01 (×5): qty 0.12

## 2016-09-01 MED ORDER — ACETAMINOPHEN 325 MG PO TABS
650.0000 mg | ORAL_TABLET | Freq: Four times a day (QID) | ORAL | Status: DC | PRN
  Administered 2016-09-01: 650 mg via ORAL
  Filled 2016-09-01: qty 2

## 2016-09-01 MED ORDER — DEXTROSE 5 % IV SOLN
2.0000 g | Freq: Once | INTRAVENOUS | Status: AC
  Administered 2016-09-01: 2 g via INTRAVENOUS
  Filled 2016-09-01: qty 2

## 2016-09-01 MED ORDER — LEVOTHYROXINE SODIUM 50 MCG PO TABS
50.0000 ug | ORAL_TABLET | Freq: Every day | ORAL | Status: DC
  Administered 2016-09-01 – 2016-09-04 (×4): 50 ug via ORAL
  Filled 2016-09-01 (×4): qty 1

## 2016-09-01 MED ORDER — SODIUM CHLORIDE 0.9% FLUSH
3.0000 mL | Freq: Two times a day (BID) | INTRAVENOUS | Status: DC
  Administered 2016-09-01 – 2016-09-04 (×6): 3 mL via INTRAVENOUS

## 2016-09-01 MED ORDER — AMLODIPINE BESYLATE 10 MG PO TABS
10.0000 mg | ORAL_TABLET | Freq: Every day | ORAL | Status: DC
  Administered 2016-09-01 – 2016-09-04 (×4): 10 mg via ORAL
  Filled 2016-09-01 (×3): qty 1
  Filled 2016-09-01: qty 2

## 2016-09-01 MED ORDER — PANTOPRAZOLE SODIUM 40 MG PO TBEC
40.0000 mg | DELAYED_RELEASE_TABLET | Freq: Every day | ORAL | Status: DC
  Administered 2016-09-01 – 2016-09-04 (×4): 40 mg via ORAL
  Filled 2016-09-01 (×4): qty 1

## 2016-09-01 MED ORDER — OLANZAPINE 10 MG PO TABS
10.0000 mg | ORAL_TABLET | Freq: Every day | ORAL | Status: DC
  Administered 2016-09-01 – 2016-09-03 (×3): 10 mg via ORAL
  Filled 2016-09-01 (×4): qty 1

## 2016-09-01 MED ORDER — DOCUSATE SODIUM 100 MG PO CAPS
100.0000 mg | ORAL_CAPSULE | Freq: Two times a day (BID) | ORAL | Status: DC
  Administered 2016-09-01 – 2016-09-04 (×7): 100 mg via ORAL
  Filled 2016-09-01 (×7): qty 1

## 2016-09-01 MED ORDER — DEXTROSE 5 % IV SOLN
2.0000 g | Freq: Three times a day (TID) | INTRAVENOUS | Status: DC
  Administered 2016-09-01 – 2016-09-02 (×3): 2 g via INTRAVENOUS
  Filled 2016-09-01 (×6): qty 2

## 2016-09-01 MED ORDER — MELATONIN 5 MG PO TABS
5.0000 mg | ORAL_TABLET | Freq: Every day | ORAL | Status: DC
  Administered 2016-09-01 – 2016-09-03 (×3): 5 mg via ORAL
  Filled 2016-09-01 (×5): qty 1

## 2016-09-01 MED ORDER — ASPIRIN 81 MG PO CHEW
324.0000 mg | CHEWABLE_TABLET | Freq: Every day | ORAL | Status: DC
  Administered 2016-09-01 – 2016-09-04 (×4): 324 mg via ORAL
  Filled 2016-09-01 (×4): qty 4
  Filled 2016-09-01: qty 2

## 2016-09-01 NOTE — Clinical Social Work Note (Signed)
CSW was informed that patient is from [facility].  CSW contacted ALF who confirmed that patient is from the regular ALF.  ALF reports that patient normally uses a walker to get around facility and she receives assistance with bathing, toileting, and taking her meds.  CSW to continue to follow patient's progress pending PT recommendations.  Ervin KnackEric R. Sakinah Rosamond, MSW, Theresia MajorsLCSWA [phone]  09/01/2016 5:18 PM

## 2016-09-01 NOTE — ED Notes (Signed)
Patient given lemon swabs d/t NPO status

## 2016-09-01 NOTE — Progress Notes (Signed)
Pharmacy Antibiotic Note  Danielle Boyer is a 76 y.o. female admitted on 09/01/2016 with sepsis/HCAP.  Pharmacy has been consulted for aztreonam and vancomycin dosing.  Plan: 1. Aztreonam 2 gm IV Q8H 2. Vancomycin 1 gm IV x 1 followed in approximately 13 hours (stacked dosing) by vancomycin 750 mg IV Q18H, predicted trough 15 mcg/mL. Pharmacy will continue to follow and adjust as needed to maintain trough 15 to 20 mcg/mL.   Vd 38.6 L, Ke 0.047 hr-1, T1/2 14.8 hr  Height: 5\' 2"  (157.5 cm) Weight: 140 lb (63.5 kg) IBW/kg (Calculated) : 50.1  Temp (24hrs), Avg:100.2 F (37.9 C), Min:98 F (36.7 C), Max:102.3 F (39.1 C)   Recent Labs Lab 09/01/16 0318  WBC 13.0*  CREATININE 0.65  LATICACIDVEN 3.5*    Estimated Creatinine Clearance: 51.6 mL/min (by C-G formula based on SCr of 0.65 mg/dL).    Allergies  Allergen Reactions  . Citalopram Other (See Comments)    Reaction:  Unknown   . Penicillins Rash and Other (See Comments)    Unable to obtain enough information to answer additional questions about this medication.  Has patient had a PCN reaction causing immediate rash, facial/tongue/throat swelling, SOB or lightheadedness with hypotension: Yes Has patient had a PCN reaction causing severe rash involving mucus membranes or skin necrosis: No Has patient had a PCN reaction that required hospitalization: unknown Has patient had a PCN reaction occurring within the last 10 years: unknown If all of the above answers are "NO", t    Thank you for allowing pharmacy to be a part of this patient's care.  Carola FrostNathan A Brihanna Devenport, Pharm.D., BCPS Clinical Pharmacist 09/01/2016 6:35 AM

## 2016-09-01 NOTE — Care Management (Addendum)
Patient is a resident of [facility]. History of dementia.  She was found on the floor.  It is  not known if she fell.  Admitted with sepsis.  CSW aware patient is from a facility.  Requested PT consult as patient will have to maintain ability to ambulate to return to assisted living

## 2016-09-01 NOTE — Progress Notes (Signed)
A little after 1400 she told me she had a headache, didn't respond to questions about about how bad it was.  She calmly told me she had a headache and pointed to her forehead.  Gave her tylenold 650 mg. Now says headache is gone.

## 2016-09-01 NOTE — ED Triage Notes (Addendum)
Per EMS patient was found [facility].  He has a hx of schizophrenia, hypothyroidism, dementia, hyperkalemia.  EMS reports she had a pulse ox of 89-94% en route (EMS said it did better on other fingers) Pt denies any pain at this time and inspection does not reveal any injuries to elbows, knees, and cranium.  She has no respiratory history but is hypoxic tonight at 91% oxygen on room air.  Patient had loose stool en route to ER.  Daughter was alerted by [facility] of this incident and she insisted her mother to be checked out at the ER.

## 2016-09-01 NOTE — ED Provider Notes (Signed)
[facility] Emergency Department Provider Note   First MD Initiated Contact with Patient 09/01/16 [phone]     (approximate)  I have reviewed the triage vital signs and the nursing notes.  History Limited secondary to dementia HISTORY  Chief Complaint Possible Fall    HPI Danielle Boyer is a 76 y.o. female with below list of chronic medical conditions including dementia presents to the emergency department via EMS history of unwitnessed fall at nursing facility where she resides. EMS states that the patient's roommate stated that she fell out of the bed. Her EMS on their arrival patient's oxygen saturation ranging from 89-94% on room air.    Past Medical History:  Diagnosis Date  . CAD (coronary artery disease)   . Dementia   . 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 GERD (gastroesophageal reflux disease)   . Hip fracture (HCC)   . Hypertension   . Hypokalemia   . Schizophrenia (HCC)   . Thyroid disease    hypo    Patient Active Problem List   Diagnosis Date Noted  . Sepsis (HCC) 09/01/2016  . Closed left hip fracture (HCC) 02/29/2016  . 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 Essential hypertension 06/07/2015  . Hearing loss 06/07/2015  . Hypothyroidism 06/07/2015  . Cardiovascular disease 06/07/2015  . Atrial fibrillation (HCC) 06/07/2015  . GERD (gastroesophageal reflux disease) 06/07/2015  . IBS (irritable bowel syndrome) 06/07/2015  . Osteoarthritis 06/07/2015  . Paranoid schizophrenia (HCC)   . Schizophrenia (HCC) 06/06/2015  . Diabetes (HCC) 03/19/2015    Past Surgical History:  Procedure Laterality Date  . ABDOMINAL HYSTERECTOMY    . CESAREAN SECTION     x3  . HIP ARTHROPLASTY Left 03/01/2016   Procedure: ARTHROPLASTY BIPOLAR HIP (HEMIARTHROPLASTY);  Surgeon: Juanell Fairly, MD;  Location: [facility] ORS;  Service: Orthopedics;  Laterality: Left;    Prior to Admission medications   Medication Sig Start Date End  Date Taking? Authorizing Provider  acetaminophen (TYLENOL) 500 MG tablet Take 500 mg by mouth 3 (three) times daily. Take at 8 am, 2 pm, and 6 pm. *Not to exceed 3 gm/24hr*   Yes Historical Provider, MD  amLODipine (NORVASC) 10 MG tablet Take 10 mg by mouth daily.   Yes Historical Provider, MD  aspirin 81 MG chewable tablet Chew 324 mg by mouth daily.   Yes Historical Provider, MD  atorvastatin (LIPITOR) 40 MG tablet Take 1 tablet (40 mg total) by mouth daily at 6 PM. 06/11/15  Yes Jimmy Footman, MD  bisacodyl (DULCOLAX) 5 MG EC tablet Take 1 tablet (5 mg total) by mouth daily as needed for moderate constipation. 03/03/16  Yes Shaune Pollack, MD  cyanocobalamin 1000 MCG tablet Take 1 tablet (1,000 mcg total) by mouth daily. 06/11/15  Yes Jimmy Footman, MD  docusate sodium (COLACE) 100 MG capsule Take 1 capsule (100 mg total) by mouth 2 (two) times daily. 03/03/16  Yes Shaune Pollack, MD  ferrous sulfate 325 (65 FE) MG tablet Take 1 tablet (325 mg total) by mouth 3 (three) times daily after meals. 03/03/16  Yes Shaune Pollack, MD  HYDROcodone-acetaminophen (NORCO/VICODIN) 5-325 MG tablet Take 1 tablet by mouth every 4 (four) hours as needed for moderate pain. 03/03/16  Yes Shaune Pollack, MD  insulin detemir (LEVEMIR) 100 UNIT/ML injection Inject 0.2 mLs (20 Units total) into the skin daily. Patient taking differently: Inject 25 Units into the skin at bedtime.  03/03/16  Yes Shaune Pollack, MD  levothyroxine (  SYNTHROID, LEVOTHROID) 50 MCG tablet Take 1 tablet (50 mcg total) by mouth daily before breakfast. 06/11/15  Yes Jimmy Footman, MD  LORazepam (ATIVAN) 0.5 MG tablet Take 1 tablet (0.5 mg total) by mouth 3 (three) times daily as needed for anxiety. And /or agitation. Patient taking differently: Take 0.5 mg by mouth every 6 (six) hours as needed for anxiety. And /or agitation.  03/03/16  Yes Shaune Pollack, MD  Melatonin 3 MG TABS Take 6 mg by mouth at bedtime.   Yes Historical Provider, MD  metFORMIN  (GLUCOPHAGE) 500 MG tablet Take 1,000 mg by mouth 2 (two) times daily with a meal.   Yes Historical Provider, MD  metoprolol (LOPRESSOR) 50 MG tablet Take 50 mg by mouth 2 (two) times daily.   Yes Historical Provider, MD  nystatin (NYSTATIN) powder Apply 1 g topically daily as needed. For rash.   Yes Historical Provider, MD  OLANZapine (ZYPREXA) 10 MG tablet Take 1 tablet (10 mg total) by mouth at bedtime. 06/04/15  Yes Audery Amel, MD  omeprazole (PRILOSEC) 20 MG capsule Take 20 mg by mouth daily.    Yes Historical Provider, MD  traZODone (DESYREL) 100 MG tablet Take 100 mg by mouth at bedtime.   Yes Historical Provider, MD  enoxaparin (LOVENOX) 30 MG/0.3ML injection Inject 0.3 mLs (30 mg total) into the skin every 12 (twelve) hours. For 4-6 weeks, follow up Dr. Martha Clan to stop. 03/03/16 03/31/16  Shaune Pollack, MD    Allergies Citalopram and Penicillins  Family History  Problem Relation Age of Onset  . Breast cancer Sister     Social History Social History  Substance Use Topics  . Smoking status: Never Smoker  . Smokeless tobacco: Never Used  . Alcohol use No    Review of Systems Constitutional: No fever/chills Eyes: No visual changes. ENT: No sore throat. Cardiovascular: Denies chest pain. Respiratory: Denies shortness of breath. Gastrointestinal: No abdominal pain.  No nausea, no vomiting.  No diarrhea.  No constipation. Genitourinary: Negative for dysuria. Musculoskeletal: Negative for back pain. Skin: Negative for rash. Neurological: Negative for headaches, focal weakness or numbness.  10-point ROS otherwise negative.  ____________________________________________   PHYSICAL EXAM:  VITAL SIGNS: ED Triage Vitals  Enc Vitals Group     BP 09/01/16 0311 (!) 130/55     Pulse Rate 09/01/16 0311 (!) 108     Resp 09/01/16 0311 (!) 21     Temp 09/01/16 0311 98 F (36.7 C)     Temp Source 09/01/16 0311 Oral     SpO2 09/01/16 0305 94 %     Weight --      Height --       Head Circumference --      Peak Flow --      Pain Score --      Pain Loc --      Pain Edu? --      Excl. in GC? --     Constitutional: Alert and  Ill appearing. Eyes: Conjunctivae are normal. PERRL. EOMI. Head: Atraumatic. Mouth/Throat: Mucous membranes are moist. Oropharynx non-erythematous. Neck: No stridor.  No meningeal signs.  No cervical spine tenderness to palpation. Cardiovascular: Tachycardia, regular rhythm. Good peripheral circulation. Grossly normal heart sounds. Respiratory: Tachypnea she  No retractions. Diffuse right lung field rhonchi. Left lower lung rhonchi Gastrointestinal: Soft and nontender. No distention.  Musculoskeletal: No lower extremity tenderness nor edema. No gross deformities of extremities. Neurologic:  No gross focal neurologic deficits are appreciated.  Skin:  Skin is hot, dry and intact. No rash noted. Psychiatric: Mood and affect are normal. Speech and behavior are normal.  ____________________________________________   LABS (all labs ordered are listed, but only abnormal results are displayed)  Labs Reviewed  CBC - Abnormal; Notable for the following:       Result Value   WBC 13.0 (*)    RBC 3.49 (*)    Hemoglobin 10.9 (*)    HCT 33.5 (*)    RDW 16.4 (*)    All other components within normal limits  COMPREHENSIVE METABOLIC PANEL - Abnormal; Notable for the following:    Glucose, Bld 162 (*)    Calcium 8.7 (*)    All other components within normal limits  LACTIC ACID, PLASMA - Abnormal; Notable for the following:    Lactic Acid, Venous 3.5 (*)    All other components within normal limits  CULTURE, BLOOD (ROUTINE X 2)  CULTURE, BLOOD (ROUTINE X 2)  TROPONIN I  INFLUENZA PANEL BY PCR (TYPE A & B)  LACTIC ACID, PLASMA  URINALYSIS, COMPLETE (UACMP) WITH MICROSCOPIC   ____________________________________________  EKG  ED ECG REPORT I, [place] N BROWN, the attending physician, personally viewed and interpreted this ECG.   Date:  09/01/2016  EKG Time: 3:08 AM  Rate: 109  Rhythm: Sinus tachycardia  Axis: Normal  Intervals: Normal  ST&T Change: None _______________________________________  RADIOLOGY I, [place] N BROWN, personally viewed and evaluated these images (plain radiographs) as part of my medical decision making, as well as reviewing the written report by the radiologist.  Ct Head Wo Contrast  Result Date: 09/01/2016 CLINICAL DATA:  Found on floor at bedside in nursing home. Head injury. History of dementia, hypertension, diabetes. EXAM: CT HEAD WITHOUT CONTRAST TECHNIQUE: Contiguous axial images were obtained from the base of the skull through the vertex without intravenous contrast. COMPARISON:  CT HEAD May 31, 2016 FINDINGS: Mild motion degraded examination. BRAIN: The ventricles and sulci are normal for age. No intraparenchymal hemorrhage, mass effect nor midline shift. Patchy to confluent supratentorial white matter hypodensities. No acute large vascular territory infarcts. No abnormal extra-axial fluid collections. Basal cisterns are patent. VASCULAR: Moderate to severe calcific atherosclerosis of the carotid siphons and intradural vertebral artery's. SKULL: No skull fracture. No significant scalp soft tissue swelling. SINUSES/ORBITS: Mild paranasal sinus mucosal thickening. Mastoid air cells are well aerated. The included ocular globes and orbital contents are non-suspicious. Status post bilateral ocular lens implants. OTHER: Patient is edentulous. IMPRESSION: No acute intracranial process on this mildly motion degraded examination. Stable examination: Involutional changes and moderate chronic small vessel ischemic disease. Electronically Signed   By: Awilda Metro M.D.   On: 09/01/2016 04:35   Dg Chest Portable 1 View  Result Date: 09/01/2016 CLINICAL DATA:  Hypoxia. Dyspnea. History of hypertension diabetes. EXAM: PORTABLE CHEST 1 VIEW COMPARISON:  Chest radiograph February 29, 2016 FINDINGS:  Multifocal consolidation RIGHT lung. Cardiomediastinal silhouette is normal, mildly calcified aortic knob. No pleural effusion. No pneumothorax. Soft tissue planes and included osseous structures are nonsuspicious. IMPRESSION: Multifocal RIGHT pneumonia. Followup PA and lateral chest X-ray is recommended in 3-4 weeks following trial of antibiotic therapy to ensure resolution and exclude underlying malignancy. Electronically Signed   By: Awilda Metro M.D.   On: 09/01/2016 03:25    Procedures   Critical Care performed: CRITICAL CARE Performed by: Darci Current   Total critical care time: 45 minutes  Critical care time was exclusive of separately billable procedures and treating other patients.  Critical  care was necessary to treat or prevent imminent or life-threatening deterioration.  Critical care was time spent personally by me on the following activities: development of treatment plan with patient and/or surrogate as well as nursing, discussions with consultants, evaluation of patient's response to treatment, examination of patient, obtaining history from patient or surrogate, ordering and performing treatments and interventions, ordering and review of laboratory studies, ordering and review of radiographic studies, pulse oximetry and re-evaluation of patient's condition.  ____________________________________________   INITIAL IMPRESSION / ASSESSMENT AND PLAN / ED COURSE  Pertinent labs & imaging results that were available during my care of the patient were reviewed by me and considered in my medical decision making (see chart for details).  History of physical exam consistent with possible hospital-acquired pneumonia with sepsis as such chest is protocol was initiated patient received appropriate antibiotic therapy and IV fluid. Patient discussed [facility] for further evaluation and management.       ____________________________________________  FINAL CLINICAL IMPRESSION(S) / ED DIAGNOSES  Final diagnoses:  Sepsis, due to unspecified organism (HCC)  HCAP (healthcare-associated pneumonia)     MEDICATIONS GIVEN DURING THIS VISIT:  Medications  sodium chloride 0.9 % bolus 1,000 mL (not administered)  sodium chloride 0.9 % bolus 1,000 mL (not administered)  aztreonam (AZACTAM) 2 g in dextrose 5 % 50 mL IVPB (0 g Intravenous Stopped 09/01/16 0544)  vancomycin (VANCOCIN) IVPB 1000 mg/200 mL premix (1,000 mg Intravenous New Bag/Given 09/01/16 0454)     NEW OUTPATIENT MEDICATIONS STARTED DURING THIS VISIT:  New Prescriptions   No medications on file    Modified Medications   No medications on file    Discontinued Medications   HYDRALAZINE (APRESOLINE) 25 MG TABLET    Take 25 mg by mouth every 6 (six) hours as needed. For Systolic blood pressure over 190 or diastolic blood pressure over 90.     Note:  This document was prepared using Dragon voice recognition software and may include unintentional dictation errors.    Darci Currentandolph N Brown, MD 09/01/16 [phone]

## 2016-09-01 NOTE — ED Notes (Signed)
Per EDP, give 1 bolus of fluids at a time.

## 2016-09-01 NOTE — Plan of Care (Signed)
Problem: Education: Goal: Knowledge of [facility] Education information/materials will improve Outcome: Completed/Met Date Met: 09/01/16 Oriented patient and daughter to room features, unit routines, food service, falls precautions, infection prevention.

## 2016-09-01 NOTE — ED Notes (Signed)
Patient transported to CT 

## 2016-09-01 NOTE — Progress Notes (Signed)
[facility] [place] NAME: Danielle Boyer    MR#:  [id number]  DATE OF BIRTH:  10/08/1940  SUBJECTIVE:   Patient here after a fall and noted to have multifocal pneumonia on chest x-ray. Patient denies any chest pain, shortness of breath, cough. Afebrile overnight.  REVIEW OF SYSTEMS:    Review of Systems  Unable to perform ROS: Dementia    Nutrition: Heart Healthy/Carb modified.  Tolerating Diet: Yes Tolerating PT: Await Eval  DRUG ALLERGIES:   Allergies  Allergen Reactions  . Citalopram Other (See Comments)    Reaction:  Unknown   . Penicillins Rash and Other (See Comments)    Unable to obtain enough information to answer additional questions about this medication.  Has patient had a PCN reaction causing immediate rash, facial/tongue/throat swelling, SOB or lightheadedness with hypotension: Yes Has patient had a PCN reaction causing severe rash involving mucus membranes or skin necrosis: No Has patient had a PCN reaction that required hospitalization: unknown Has patient had a PCN reaction occurring within the last 10 years: unknown If all of the above answers are "NO", t    VITALS:  Blood pressure (!) 109/52, pulse 83, temperature 98.5 F (36.9 C), temperature source Oral, resp. rate 16, height 5' 2"  (1.575 m), weight 68 kg (150 lb), SpO2 94 %.  PHYSICAL EXAMINATION:   Physical Exam  GENERAL:  76 y.o.-year-old patient lying in bed in no acute distress.  EYES: Pupils equal, round, reactive to light and accommodation. No scleral icterus. Extraocular muscles intact.  HEENT: Head atraumatic, normocephalic. Oropharynx and nasopharynx clear.  NECK:  Supple, no jugular venous distention. No thyroid enlargement, no tenderness.  LUNGS: Normal breath sounds bilaterally, no wheezing, rales, rhonchi. No use of accessory muscles of respiration.  CARDIOVASCULAR: S1, S2 normal. No murmurs, rubs, or gallops.  ABDOMEN: Soft, nontender,  nondistended. Bowel sounds present. No organomegaly or mass.  EXTREMITIES: No cyanosis, clubbing or edema b/l.    NEUROLOGIC: Cranial nerves II through XII are intact. No focal Motor or sensory deficits b/l.  Globally weak.  PSYCHIATRIC: The patient is alert and oriented x 1.  SKIN: No obvious rash, lesion, or ulcer.    LABORATORY PANEL:   CBC  Recent Labs Lab 09/01/16 0318  WBC 13.0*  HGB 10.9*  HCT 33.5*  PLT 240   ------------------------------------------------------------------------------------------------------------------  Chemistries   Recent Labs Lab 09/01/16 0318  NA 140  K 3.7  CL 108  CO2 23  GLUCOSE 162*  BUN 20  CREATININE 0.65  CALCIUM 8.7*  AST 30  ALT 14  ALKPHOS 53  BILITOT 0.4   ------------------------------------------------------------------------------------------------------------------  Cardiac Enzymes  Recent Labs Lab 09/01/16 0318  TROPONINI <0.03   ------------------------------------------------------------------------------------------------------------------  RADIOLOGY:  Ct Head Wo Contrast  Result Date: 09/01/2016 CLINICAL DATA:  Found on floor at bedside in nursing home. Head injury. History of dementia, hypertension, diabetes. EXAM: CT HEAD WITHOUT CONTRAST TECHNIQUE: Contiguous axial images were obtained from the base of the skull through the vertex without intravenous contrast. COMPARISON:  CT HEAD May 31, 2016 FINDINGS: Mild motion degraded examination. BRAIN: The ventricles and sulci are normal for age. No intraparenchymal hemorrhage, mass effect nor midline shift. Patchy to confluent supratentorial white matter hypodensities. No acute large vascular territory infarcts. No abnormal extra-axial fluid collections. Basal cisterns are patent. VASCULAR: Moderate to severe calcific atherosclerosis of the carotid siphons and intradural vertebral artery's. SKULL: No skull fracture. No significant scalp soft tissue swelling.  SINUSES/ORBITS: Mild paranasal  sinus mucosal thickening. Mastoid air cells are well aerated. The included ocular globes and orbital contents are non-suspicious. Status post bilateral ocular lens implants. OTHER: Patient is edentulous. IMPRESSION: No acute intracranial process on this mildly motion degraded examination. Stable examination: Involutional changes and moderate chronic small vessel ischemic disease. Electronically Signed   By: Elon Alas M.D.   On: 09/01/2016 04:35   Dg Chest Portable 1 View  Result Date: 09/01/2016 CLINICAL DATA:  Hypoxia. Dyspnea. History of hypertension diabetes. EXAM: PORTABLE CHEST 1 VIEW COMPARISON:  Chest radiograph February 29, 2016 FINDINGS: Multifocal consolidation RIGHT lung. Cardiomediastinal silhouette is normal, mildly calcified aortic knob. No pleural effusion. No pneumothorax. Soft tissue planes and included osseous structures are nonsuspicious. IMPRESSION: Multifocal RIGHT pneumonia. Followup PA and lateral chest X-ray is recommended in 3-4 weeks following trial of antibiotic therapy to ensure resolution and exclude underlying malignancy. Electronically Signed   By: Elon Alas M.D.   On: 09/01/2016 03:25     ASSESSMENT AND PLAN:   76 year old female with past medical history of dementia, schizophrenia, hypertension, GERD, diabetes, history of coronary artery disease who presents to the hospital after a fall and also noted to have multifocal pneumonia on chest x-ray.  1. Sepsis-patient met sepsis criteria admission given her leukocytosis, elevated lactic acid, and chest x-ray findings suggestive of multifocal pneumonia. -Continue IV fluids, IV aztreonam for pneumonia. MRSA PCR is negative therefore will DC vancomycin. -Cultures so far negative. Patient clinically afebrile and hemodynamically stable.  2. Pneumonia-patient noted to have a multifocal pneumonia on the right side based on chest x-ray findings. Clinically she is asymptomatic. I will  continue IV estrogen M, DC vancomycin given her MRCP CRP negative.  3. Status post fall-I'll get a physical therapy consult to assess her mobility.  4. Diabetes type 2 without complication-continue Levemir, sliding scale insulin.  5. Hypothyroidism-continue Synthroid.  6. Essential hypertension-continue Lopressor, Norvasc.  7. GERD-continue Protonix.  8. Dementia with history of schizophrenia-continue Zyprexa. -Continue as needed Haldol.  All the records are reviewed and case discussed with Care Management/Social Worker. Management plans discussed with the patient, family and they are in agreement.  CODE STATUS: Full  DVT Prophylaxis: Lovenox  TOTAL TIME TAKING CARE OF THIS PATIENT: 30 minutes.   POSSIBLE D/C IN 1-2 DAYS, DEPENDING ON CLINICAL CONDITION.   Henreitta Leber M.D on 09/01/2016 at 2:26 PM  Between 7am to 6pm - Pager - [phone]  After 6pm go to [email or web address] - Proofreader  [facility]  Office  [phone]  CC: Primary care physician; Idelle Crouch, MD

## 2016-09-01 NOTE — Care Management Important Message (Signed)
Important Message  Patient Details  Name: Danielle Boyer MRN: [id number] Date of Birth: 11/29/1940   Medicare Important Message Given:  Yes  Initial signed IM printed from Epic and given to patient.     Eber HongGreene, Jeffifer Rabold R, RN 09/01/2016, 2:22 PM

## 2016-09-01 NOTE — ED Notes (Signed)
Hospitalist at bedside 

## 2016-09-01 NOTE — H&P (Signed)
Danielle Boyer is an 76 y.o. female.   Chief Complaint: Fall HPI: The patient with past medical history of dementia, diabetes, heart disease and hypertension presents emergency department from her skilled nursing facility after a possible fall. The patient has dementia and cannot contribute to her own history. She was found curled up betwean her bed and nightstand. She was conscious but leaning her head against the furniture. CT of her head in the emergency department was negative for acute process. Her evaluation in the emergency department was significant for leukocytosis. Chest x-ray showed multifocal pneumonia on the right and the patient was tachypneic and tachycardic which prompted the emergency department staff to initiate septic protocol prior to calling the hospitalist service for admission.  Past Medical History:  Diagnosis Date  . CAD (coronary artery disease)   . Dementia   . 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 GERD (gastroesophageal reflux disease)   . Hip fracture ([place])   . Hypertension   . Hypokalemia   . Schizophrenia ([place])   . Thyroid disease    hypo    Past Surgical History:  Procedure Laterality Date  . ABDOMINAL HYSTERECTOMY    . CESAREAN SECTION     x3  . HIP ARTHROPLASTY Left 03/01/2016   Procedure: ARTHROPLASTY BIPOLAR HIP (HEMIARTHROPLASTY);  Surgeon: Thornton Park, MD;  Location: [facility] ORS;  Service: Orthopedics;  Laterality: Left;    Family History  Problem Relation Age of Onset  . Breast cancer Sister    Social History:  reports that she has never smoked. She has never used smokeless tobacco. She reports that she does not drink alcohol or use drugs.  Allergies:  Allergies  Allergen Reactions  . Citalopram Other (See Comments)    Reaction:  Unknown   . Penicillins Rash and Other (See Comments)    Unable to obtain enough information to answer additional questions about this medication.  Has patient had a PCN reaction causing  immediate rash, facial/tongue/throat swelling, SOB or lightheadedness with hypotension: Yes Has patient had a PCN reaction causing severe rash involving mucus membranes or skin necrosis: No Has patient had a PCN reaction that required hospitalization: unknown Has patient had a PCN reaction occurring within the last 10 years: unknown If all of the above answers are "NO", t    Prior to Admission medications   Medication Sig Start Date End Date Taking? Authorizing Provider  acetaminophen (TYLENOL) 500 MG tablet Take 500 mg by mouth 3 (three) times daily. Take at 8 am, 2 pm, and 6 pm. *Not to exceed 3 gm/24hr*   Yes Historical Provider, MD  amLODipine (NORVASC) 10 MG tablet Take 10 mg by mouth daily.   Yes Historical Provider, MD  aspirin 81 MG chewable tablet Chew 324 mg by mouth daily.   Yes Historical Provider, MD  atorvastatin (LIPITOR) 40 MG tablet Take 1 tablet (40 mg total) by mouth daily at 6 PM. 06/11/15  Yes Hildred Priest, MD  bisacodyl (DULCOLAX) 5 MG EC tablet Take 1 tablet (5 mg total) by mouth daily as needed for moderate constipation. 03/03/16  Yes Demetrios Loll, MD  cyanocobalamin 1000 MCG tablet Take 1 tablet (1,000 mcg total) by mouth daily. 06/11/15  Yes Hildred Priest, MD  docusate sodium (COLACE) 100 MG capsule Take 1 capsule (100 mg total) by mouth 2 (two) times daily. 03/03/16  Yes Demetrios Loll, MD  ferrous sulfate 325 (65 FE) MG tablet Take 1 tablet (325 mg total) by mouth 3 (  three) times daily after meals. 03/03/16  Yes Demetrios Loll, MD  HYDROcodone-acetaminophen (NORCO/VICODIN) 5-325 MG tablet Take 1 tablet by mouth every 4 (four) hours as needed for moderate pain. 03/03/16  Yes Demetrios Loll, MD  insulin detemir (LEVEMIR) 100 UNIT/ML injection Inject 0.2 mLs (20 Units total) into the skin daily. Patient taking differently: Inject 25 Units into the skin at bedtime.  03/03/16  Yes Demetrios Loll, MD  levothyroxine (SYNTHROID, LEVOTHROID) 50 MCG tablet Take 1 tablet (50 mcg  total) by mouth daily before breakfast. 06/11/15  Yes Hildred Priest, MD  LORazepam (ATIVAN) 0.5 MG tablet Take 1 tablet (0.5 mg total) by mouth 3 (three) times daily as needed for anxiety. And /or agitation. Patient taking differently: Take 0.5 mg by mouth every 6 (six) hours as needed for anxiety. And /or agitation.  03/03/16  Yes Demetrios Loll, MD  Melatonin 3 MG TABS Take 6 mg by mouth at bedtime.   Yes Historical Provider, MD  metFORMIN (GLUCOPHAGE) 500 MG tablet Take 1,000 mg by mouth 2 (two) times daily with a meal.   Yes Historical Provider, MD  metoprolol (LOPRESSOR) 50 MG tablet Take 50 mg by mouth 2 (two) times daily.   Yes Historical Provider, MD  nystatin (NYSTATIN) powder Apply 1 g topically daily as needed. For rash.   Yes Historical Provider, MD  OLANZapine (ZYPREXA) 10 MG tablet Take 1 tablet (10 mg total) by mouth at bedtime. 06/04/15  Yes Gonzella Lex, MD  omeprazole (PRILOSEC) 20 MG capsule Take 20 mg by mouth daily.    Yes Historical Provider, MD  traZODone (DESYREL) 100 MG tablet Take 100 mg by mouth at bedtime.   Yes Historical Provider, MD  enoxaparin (LOVENOX) 30 MG/0.3ML injection Inject 0.3 mLs (30 mg total) into the skin every 12 (twelve) hours. For 4-6 weeks, follow up Dr. Mack Guise to stop. 03/03/16 03/31/16  Demetrios Loll, MD     Results for orders placed or performed during the hospital encounter of 09/01/16 (from the past 48 hour(s))  CBC     Status: Abnormal   Collection Time: 09/01/16  3:18 AM  Result Value Ref Range   WBC 13.0 (H) 3.6 - 11.0 K/uL   RBC 3.49 (L) 3.80 - 5.20 MIL/uL   Hemoglobin 10.9 (L) 12.0 - 16.0 g/dL   HCT 33.5 (L) 35.0 - 47.0 %   MCV 96.0 80.0 - 100.0 fL   MCH 31.3 26.0 - 34.0 pg   MCHC 32.6 32.0 - 36.0 g/dL   RDW 16.4 (H) 11.5 - 14.5 %   Platelets 240 150 - 440 K/uL  Comprehensive metabolic panel     Status: Abnormal   Collection Time: 09/01/16  3:18 AM  Result Value Ref Range   Sodium 140 135 - 145 mmol/L   Potassium 3.7 3.5 -  5.1 mmol/L   Chloride 108 101 - 111 mmol/L   CO2 23 22 - 32 mmol/L   Glucose, Bld 162 (H) 65 - 99 mg/dL   BUN 20 6 - 20 mg/dL   Creatinine, Ser 0.65 0.44 - 1.00 mg/dL   Calcium 8.7 (L) 8.9 - 10.3 mg/dL   Total Protein 6.5 6.5 - 8.1 g/dL   Albumin 3.6 3.5 - 5.0 g/dL   AST 30 15 - 41 U/L   ALT 14 14 - 54 U/L   Alkaline Phosphatase 53 38 - 126 U/L   Total Bilirubin 0.4 0.3 - 1.2 mg/dL   GFR calc non Af Amer >60 >60 mL/min   GFR  calc Af Amer >60 >60 mL/min    Comment: (NOTE) The eGFR has been calculated using the CKD EPI equation. This calculation has not been validated in all clinical situations. eGFR's persistently <60 mL/min signify possible Chronic Kidney Disease.    Anion gap 9 5 - 15  Troponin I     Status: None   Collection Time: 09/01/16  3:18 AM  Result Value Ref Range   Troponin I <0.03 <0.03 ng/mL  Lactic acid, plasma     Status: Abnormal   Collection Time: 09/01/16  3:18 AM  Result Value Ref Range   Lactic Acid, Venous 3.5 (HH) 0.5 - 1.9 mmol/L    Comment: CRITICAL RESULT CALLED TO, READ BACK BY AND VERIFIED WITH KACEY ROBERTS @ 0413 09/01/2016 BY [facility]   Influenza panel by PCR (type A & B)     Status: None   Collection Time: 09/01/16  3:18 AM  Result Value Ref Range   Influenza A By PCR NEGATIVE NEGATIVE   Influenza B By PCR NEGATIVE NEGATIVE    Comment: (NOTE) The Xpert Xpress Flu assay is intended as an aid in the diagnosis of  influenza and should not be used as a sole basis for treatment.  This  assay is FDA approved for nasopharyngeal swab specimens only. Nasal  washings and aspirates are unacceptable for Xpert Xpress Flu testing.    Ct Head Wo Contrast  Result Date: 09/01/2016 CLINICAL DATA:  Found on floor at bedside in nursing home. Head injury. History of dementia, hypertension, diabetes. EXAM: CT HEAD WITHOUT CONTRAST TECHNIQUE: Contiguous axial images were obtained from the base of the skull through the vertex without intravenous contrast. COMPARISON:   CT HEAD May 31, 2016 FINDINGS: Mild motion degraded examination. BRAIN: The ventricles and sulci are normal for age. No intraparenchymal hemorrhage, mass effect nor midline shift. Patchy to confluent supratentorial white matter hypodensities. No acute large vascular territory infarcts. No abnormal extra-axial fluid collections. Basal cisterns are patent. VASCULAR: Moderate to severe calcific atherosclerosis of the carotid siphons and intradural vertebral artery's. SKULL: No skull fracture. No significant scalp soft tissue swelling. SINUSES/ORBITS: Mild paranasal sinus mucosal thickening. Mastoid air cells are well aerated. The included ocular globes and orbital contents are non-suspicious. Status post bilateral ocular lens implants. OTHER: Patient is edentulous. IMPRESSION: No acute intracranial process on this mildly motion degraded examination. Stable examination: Involutional changes and moderate chronic small vessel ischemic disease. Electronically Signed   By: Elon Alas M.D.   On: 09/01/2016 04:35   Dg Chest Portable 1 View  Result Date: 09/01/2016 CLINICAL DATA:  Hypoxia. Dyspnea. History of hypertension diabetes. EXAM: PORTABLE CHEST 1 VIEW COMPARISON:  Chest radiograph February 29, 2016 FINDINGS: Multifocal consolidation RIGHT lung. Cardiomediastinal silhouette is normal, mildly calcified aortic knob. No pleural effusion. No pneumothorax. Soft tissue planes and included osseous structures are nonsuspicious. IMPRESSION: Multifocal RIGHT pneumonia. Followup PA and lateral chest X-ray is recommended in 3-4 weeks following trial of antibiotic therapy to ensure resolution and exclude underlying malignancy. Electronically Signed   By: Elon Alas M.D.   On: 09/01/2016 03:25    Review of Systems  Unable to perform ROS: Dementia    Blood pressure (!) 122/56, pulse 100, temperature (!) 102.3 F (39.1 C), temperature source Rectal, resp. rate (!) 27, height 5' 2"  (1.575 m), weight 63.5 kg  (140 lb), SpO2 94 %. Physical Exam  Vitals reviewed. Constitutional: She is oriented to person, place, and time. She appears well-developed and well-nourished. No distress.  HENT:  Head: Normocephalic and atraumatic.  Mouth/Throat: Oropharynx is clear and moist.  Eyes: Conjunctivae and EOM are normal. Pupils are equal, round, and reactive to light. No scleral icterus.  Neck: Normal range of motion. Neck supple. No JVD present. No tracheal deviation present. No thyromegaly present.  Cardiovascular: Normal rate, regular rhythm and normal heart sounds.  Exam reveals no gallop and no friction rub.   No murmur heard. Respiratory: Effort normal. She has decreased breath sounds in the right middle field.  GI: Soft. Bowel sounds are normal. She exhibits no distension. There is no tenderness.  Genitourinary:  Genitourinary Comments: Deferred  Musculoskeletal: Normal range of motion. She exhibits no edema.  Lymphadenopathy:    She has no cervical adenopathy.  Neurological: She is alert and oriented to person, place, and time. No cranial nerve deficit. She exhibits normal muscle tone.  Skin: Skin is warm and dry. No rash noted. No erythema.  Psychiatric: She has a normal mood and affect.  Agitated; difficult to assess complete mental status exam as the patient has dementia     Assessment/Plan This is a 76 year old female admitted for sepsis secondary to pneumonia. 1. Sepsis: The patient's criteria via fever, tachycardia, tachypnea and leukocytosis. She is hemodynamically stable. Influences screen negative. Continue broad-spectrum antibiotics. Follow blood cultures for growth and sensitivities. Lactic acid is elevated. Hydrate aggressively with intravenous saline. 2. Pneumonia: Healthcare associated. Continue aztreonam and vancomycin. Supplemental oxygen as needed. 3. Coronary artery disease: Stable; continue aspirin. 4. Hypertension: Controlled; continue amlodipine and metoprolol 5. Diabetes  mellitus type 2: Hold oral hyperglycemic agents. Continue basal insulin adjusted for hospital diet and decreased appetite. Sliding scale insulin while hospitalized. 6. Hyperlipidemia: Continue statin therapy 7. Hypothyroidism: Check TSH; continue Synthroid 8. Dementia and schizophrenia: Continue Ativan, trazodone and Zyprexa 9. DVT prophylaxis: Lovenox 10. GI prophylaxis: PPI per home regimen The patient is a full code. Time spent on admission orders and patient care approximately 45 minutes  Harrie Foreman, MD 09/01/2016, 6:36 AM

## 2016-09-02 LAB — CBC
HCT: 28.3 % — ABNORMAL LOW (ref 35.0–47.0)
Hemoglobin: 9.5 g/dL — ABNORMAL LOW (ref 12.0–16.0)
MCH: 31.9 pg (ref 26.0–34.0)
MCHC: 33.6 g/dL (ref 32.0–36.0)
MCV: 95.2 fL (ref 80.0–100.0)
PLATELETS: 214 10*3/uL (ref 150–440)
RBC: 2.98 MIL/uL — ABNORMAL LOW (ref 3.80–5.20)
RDW: 16.7 % — AB (ref 11.5–14.5)
WBC: 19 10*3/uL — ABNORMAL HIGH (ref 3.6–11.0)

## 2016-09-02 LAB — BLOOD CULTURE ID PANEL (REFLEXED)
ACINETOBACTER BAUMANNII: NOT DETECTED
Candida albicans: NOT DETECTED
Candida glabrata: NOT DETECTED
Candida krusei: NOT DETECTED
Candida parapsilosis: NOT DETECTED
Candida tropicalis: NOT DETECTED
ENTEROCOCCUS SPECIES: NOT DETECTED
ESCHERICHIA COLI: NOT DETECTED
Enterobacter cloacae complex: NOT DETECTED
Enterobacteriaceae species: NOT DETECTED
HAEMOPHILUS INFLUENZAE: NOT DETECTED
Klebsiella oxytoca: NOT DETECTED
Klebsiella pneumoniae: NOT DETECTED
LISTERIA MONOCYTOGENES: NOT DETECTED
METHICILLIN RESISTANCE: NOT DETECTED
Neisseria meningitidis: NOT DETECTED
PSEUDOMONAS AERUGINOSA: NOT DETECTED
Proteus species: NOT DETECTED
SERRATIA MARCESCENS: NOT DETECTED
STAPHYLOCOCCUS AUREUS BCID: NOT DETECTED
STREPTOCOCCUS AGALACTIAE: NOT DETECTED
STREPTOCOCCUS PNEUMONIAE: NOT DETECTED
Staphylococcus species: DETECTED — AB
Streptococcus pyogenes: NOT DETECTED
Streptococcus species: NOT DETECTED

## 2016-09-02 LAB — HEMOGLOBIN A1C
Hgb A1c MFr Bld: 6.2 % — ABNORMAL HIGH (ref 4.8–5.6)
Mean Plasma Glucose: 131 mg/dL

## 2016-09-02 LAB — GLUCOSE, CAPILLARY
Glucose-Capillary: 116 mg/dL — ABNORMAL HIGH (ref 65–99)
Glucose-Capillary: 155 mg/dL — ABNORMAL HIGH (ref 65–99)
Glucose-Capillary: 157 mg/dL — ABNORMAL HIGH (ref 65–99)
Glucose-Capillary: 192 mg/dL — ABNORMAL HIGH (ref 65–99)

## 2016-09-02 MED ORDER — LEVOFLOXACIN 500 MG PO TABS
500.0000 mg | ORAL_TABLET | Freq: Every day | ORAL | Status: DC
  Administered 2016-09-02 – 2016-09-04 (×3): 500 mg via ORAL
  Filled 2016-09-02 (×3): qty 1

## 2016-09-02 NOTE — Progress Notes (Signed)
PHARMACY - PHYSICIAN COMMUNICATION CRITICAL VALUE ALERT - BLOOD CULTURE IDENTIFICATION (BCID)  Results for orders placed or performed during the hospital encounter of 09/01/16  Blood Culture ID Panel (Reflexed) (Collected: 09/01/2016  4:25 AM)  Result Value Ref Range   Enterococcus species NOT DETECTED NOT DETECTED   Listeria monocytogenes NOT DETECTED NOT DETECTED   Staphylococcus species DETECTED (A) NOT DETECTED   Staphylococcus aureus NOT DETECTED NOT DETECTED   Methicillin resistance NOT DETECTED NOT DETECTED   Streptococcus species NOT DETECTED NOT DETECTED   Streptococcus agalactiae NOT DETECTED NOT DETECTED   Streptococcus pneumoniae NOT DETECTED NOT DETECTED   Streptococcus pyogenes NOT DETECTED NOT DETECTED   Acinetobacter baumannii NOT DETECTED NOT DETECTED   Enterobacteriaceae species NOT DETECTED NOT DETECTED   Enterobacter cloacae complex NOT DETECTED NOT DETECTED   Escherichia coli NOT DETECTED NOT DETECTED   Klebsiella oxytoca NOT DETECTED NOT DETECTED   Klebsiella pneumoniae NOT DETECTED NOT DETECTED   Proteus species NOT DETECTED NOT DETECTED   Serratia marcescens NOT DETECTED NOT DETECTED   Haemophilus influenzae NOT DETECTED NOT DETECTED   Neisseria meningitidis NOT DETECTED NOT DETECTED   Pseudomonas aeruginosa NOT DETECTED NOT DETECTED   Candida albicans NOT DETECTED NOT DETECTED   Candida glabrata NOT DETECTED NOT DETECTED   Candida krusei NOT DETECTED NOT DETECTED   Candida parapsilosis NOT DETECTED NOT DETECTED   Candida tropicalis NOT DETECTED NOT DETECTED    Name of physician (or Provider) Contacted: Dr. Sheryle Hailiamond  Changes to prescribed antibiotics required: None at this time  Carola FrostNathan A Dayrin Stallone, Pharm.D., BCPS Clinical Pharmacist 09/02/2016  2:14 AM

## 2016-09-02 NOTE — Evaluation (Signed)
Physical Therapy Evaluation Patient Details Name: Danielle Boyer MRN: [id number] DOB: 07/06/1941 TodaySteele Sizer's Date: 09/02/2016   History of Present Illness  Pt is a 76 y.o. female s/p unwitnessed fall (found curled up between bed and nightstand; conscious).  Pt admitted with sepsis secondary to PNA.  PMH includes 05/30/16 L distal fibula fx (non-op); s/p L hip hemiarthroplast 03/01/16 (d/t fall with L femoral neck fx), DM, htn, hearing loss, a-fib, paranoid schizophrenia, and mild neurocognitive disorder.  Clinical Impression  Prior to hospital admission, pt was ambulating with walker (using L ankle brace d/t h/o L distal fib fx).  [facility] lives [facility].  Currently pt is mod assist supine to sit and min assist with transfers and ambulating a few feet bed to recliner with RW.  Pt c/o R knee pain taking steps to chair (mild R knee swelling noted; nursing notified).  Pt would benefit from skilled PT to address noted impairments and functional limitations.  Recommend pt discharge to STR when medically appropriate.    Follow Up Recommendations SNF    Equipment Recommendations  Rolling walker with 5" wheels    Recommendations for Other Services       Precautions / Restrictions Precautions Precautions: Fall Restrictions Weight Bearing Restrictions: Yes Other Position/Activity Restrictions: H/o L distal fibula fx 05/31/16 (pt's daughter reports pt requires brace L ankle to walk with walker but can walk "a little" without L ankle brace).      Mobility  Bed Mobility Overal bed mobility: Needs Assistance Bed Mobility: Supine to Sit     Supine to sit: Mod assist;HOB elevated     General bed mobility comments: assist for trunk; use of siderail  Transfers Overall transfer level: Needs assistance Equipment used: Rolling walker (2 wheeled) Transfers: Sit to/from Stand Sit to Stand: Min assist         General transfer comment: assist to initiate stand  Ambulation/Gait Ambulation/Gait  assistance: Min assist Ambulation Distance (Feet): 3 Feet (bed to recliner) Assistive device: Rolling walker (2 wheeled)   Gait velocity: decreased   General Gait Details: mildly antalgic on R LE (pt c/o R knee pain); mildly unsteady  Stairs            Wheelchair Mobility    Modified Rankin (Stroke Patients Only)       Balance Overall balance assessment: Needs assistance Sitting-balance support: Bilateral upper extremity supported;Feet supported Sitting balance-Leahy Scale: Fair Sitting balance - Comments: static sitting   Standing balance support: Bilateral upper extremity supported (UE support on RW) Standing balance-Leahy Scale: Fair Standing balance comment: static standing                             Pertinent Vitals/Pain Pain Assessment: Faces Faces Pain Scale: Hurts even more Pain Location: R knee with WB'ing Pain Descriptors / Indicators: Grimacing Pain Intervention(s): Limited activity within patient's tolerance;Monitored during session;Repositioned;Other (comment) (Nursing notified)  Vitals (HR and O2 on room air) stable and WFL throughout treatment session.    Home Living Family/patient expects to be discharged to:: Assisted living               Home Equipment: Dan HumphreysWalker - 2 wheels Additional Comments: [facility]    Prior Function Level of Independence: Needs assistance   Gait / Transfers Assistance Needed: Ambulates with walker  ADL's / Homemaking Assistance Needed: Assist with bathing, toileting, meals        Hand Dominance  Extremity/Trunk Assessment   Upper Extremity Assessment Upper Extremity Assessment: Generalized weakness    Lower Extremity Assessment Lower Extremity Assessment: RLE deficits/detail;LLE deficits/detail RLE Deficits / Details: at least 3/5 AROM hip flexion, knee flexion/extension, and DF LLE Deficits / Details: at least 3/5 AROM hip flexion, knee flexion/extension, and DF        Communication   Communication: HOH  Cognition Arousal/Alertness: Awake/alert Behavior During Therapy: WFL for tasks assessed/performed Overall Cognitive Status: No family/caregiver present to determine baseline cognitive functioning (Oriented to person only)                      General Comments General comments (skin integrity, edema, etc.): Nursing reporting pt has been up transferring to commode for toileting and has been unsteady.  Nursing cleared pt for participation in physical therapy.  Pt agreeable to PT session.  Spoke with pt's daughter regarding pt's h/o L fib fx (see precautions above); pt's daughter reports pt does not have brace at hospital but can walk "a little" without it (nursing notified).    Exercises Total Joint Exercises Short Arc Quad: AAROM;Strengthening;Both;10 reps;Supine Heel Slides: AAROM;Strengthening;Both;10 reps;Supine Hip ABduction/ADduction: AAROM;Strengthening;Both;10 reps;Supine  Pt requiring vc's and tactile cues for above ex's.   Assessment/Plan    PT Assessment Patient needs continued PT services  PT Problem List Pain;Decreased mobility;Decreased balance       PT Treatment Interventions DME instruction;Gait training;Functional mobility training;Therapeutic exercise;Balance training;Therapeutic activities;Patient/family education    PT Goals (Current goals can be found in the Care Plan section)  Acute Rehab PT Goals Patient Stated Goal: to be able to walk again PT Goal Formulation: With patient Time For Goal Achievement: 09/16/16 Potential to Achieve Goals: Good    Frequency Min 2X/week   Barriers to discharge Decreased caregiver support      Co-evaluation               End of Session Equipment Utilized During Treatment: Gait belt Activity Tolerance: Patient limited by pain Patient left: in chair;with call [place]/phone within reach;with chair alarm set Nurse Communication: Mobility status;Precautions;Weight bearing  status (Pt's pain in R knee and need for L ankle brace for ambulating) PT Visit Diagnosis: Repeated falls (R29.6);History of falling (Z91.81);Difficulty in walking, not elsewhere classified (R26.2)         Time: 1610-9604 PT Time Calculation (min) (ACUTE ONLY): 23 min   Charges:   PT Evaluation $PT Eval Low Complexity: 1 Procedure PT Treatments $Therapeutic Exercise: 8-22 mins   PT G CodesHendricks Limes, PT 09/02/16, 12:10 PM [phone]

## 2016-09-02 NOTE — Progress Notes (Signed)
Patient admitted for sepsis and acute delirium. Patient was found to have 1/4 positive Bcx w/ [facility] BCID: staph species, coag negative staph -- possible contamination. White count was elevated to 19 2/28 from 13 2/27. Patient's vanc was d/c'd as MRSA PCR was negative. Aztreonam was also d/c'd seeing as how there is no gram-positive coverage and was switched to PO levaquin empirically.  Will continue to monitor for resolution of WBC and signs/symptoms of infection.  Thomasene Rippleavid Lysha Schrade, PharmD, BCPS Clinical Pharmacist 09/02/2016

## 2016-09-02 NOTE — [place] FL2 (Signed)
[place] MEDICAID FL2 LEVEL OF CARE SCREENING TOOL     IDENTIFICATION  Patient Name: Danielle Boyer Birthdate: [birth year] Sex: female Admission Date (Current Location): 09/01/2016  [place] and [place] Number:  [address] [id number] [facility] Facility and Address:  [facility], [address], [place], [place] [address]      Provider Number: [id number]  Attending Physician Name and Address:  Houston Siren, MD  Relative Name and Phone Number:  Evlyn Clines [phone] or Modena Morrow   [phone]     Current Level of Care: Hospital Recommended Level of Care: Assisted Living Facility ([facility]) Prior Approval Number:    Date Approved/Denied:   PASRR Number: [id number]  Discharge Plan: Other (Comment) ([facility])    Current Diagnoses: Patient Active Problem List   Diagnosis Date Noted  . Sepsis (HCC) 09/01/2016  . Closed left hip fracture (HCC) 02/29/2016  . 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 Essential hypertension 06/07/2015  . Hearing loss 06/07/2015  . Hypothyroidism 06/07/2015  . Cardiovascular disease 06/07/2015  . Atrial fibrillation (HCC) 06/07/2015  . GERD (gastroesophageal reflux disease) 06/07/2015  . IBS (irritable bowel syndrome) 06/07/2015  . Osteoarthritis 06/07/2015  . Paranoid schizophrenia (HCC)   . Schizophrenia (HCC) 06/06/2015  . Diabetes (HCC) 03/19/2015    Orientation RESPIRATION BLADDER Height & Weight     Self  Normal Incontinent Weight: 147 lb 8 oz (66.9 kg) Height:  5\' 3"  (160 cm)  BEHAVIORAL SYMPTOMS/MOOD NEUROLOGICAL BOWEL NUTRITION STATUS      Continent Diet (Heart Healthy Carb modified)  AMBULATORY STATUS COMMUNICATION OF NEEDS Skin   Supervision Verbally Normal                       Personal Care Assistance Level of Assistance  Bathing, Feeding, Dressing Bathing Assistance: Limited assistance Feeding assistance: Independent Dressing Assistance:  Limited assistance     Functional Limitations Info  Sight, Hearing, Speech Sight Info: Adequate Hearing Info: Adequate Speech Info: Adequate    SPECIAL CARE FACTORS FREQUENCY  PT (By licensed PT)                    Contractures      Additional Factors Info  Insulin Sliding Scale, Psychotropic, Allergies, Code Status Code Status Info: Full Code Allergies Info: CITALOPRAM, PENICILLINS Psychotropic Info: OLANZapine (ZYPREXA) tablet 10 mg Insulin Sliding Scale Info: insulin aspart (novoLOG) injection 0-9 Units 3x a day with meals       Current Medications (09/02/2016):  This is the current hospital active medication list Current Facility-Administered Medications  Medication Dose Route Frequency Provider Last Rate Last Dose  . 0.9 %  sodium chloride infusion   Intravenous Continuous Arnaldo Natal, MD 75 mL/hr at 09/01/16 2315    . acetaminophen (TYLENOL) tablet 650 mg  650 mg Oral Q6H PRN Arnaldo Natal, MD   650 mg at 09/01/16 1409   Or  . acetaminophen (TYLENOL) suppository 650 mg  650 mg Rectal Q6H PRN Arnaldo Natal, MD      . amLODipine (NORVASC) tablet 10 mg  10 mg Oral Daily Arnaldo Natal, MD   10 mg at 09/01/16 1031  . aspirin chewable tablet 324 mg  324 mg Oral Daily Arnaldo Natal, MD   324 mg at 09/01/16 1032  . atorvastatin (LIPITOR) tablet 40 mg  40 mg Oral q1800 Arnaldo Natal, MD   40 mg at 09/01/16 1747  . aztreonam (AZACTAM) 2  g in dextrose 5 % 50 mL IVPB  2 g Intravenous Q8H Darci Currentandolph N Brown, MD   2 g at 09/02/16 0701  . docusate sodium (COLACE) capsule 100 mg  100 mg Oral BID Arnaldo NatalMichael S Diamond, MD   100 mg at 09/01/16 2129  . enoxaparin (LOVENOX) injection 40 mg  40 mg Subcutaneous Q24H Arnaldo NatalMichael S Diamond, MD   40 mg at 09/01/16 1030  . ferrous sulfate tablet 325 mg  325 mg Oral TID PC Arnaldo NatalMichael S Diamond, MD   325 mg at 09/01/16 1747  . haloperidol lactate (HALDOL) injection 1 mg  1 mg Intravenous Q6H PRN Arnaldo NatalMichael S Diamond, MD      .  HYDROcodone-acetaminophen (NORCO/VICODIN) 5-325 MG per tablet 1 tablet  1 tablet Oral Q4H PRN Arnaldo NatalMichael S Diamond, MD      . insulin aspart (novoLOG) injection 0-9 Units  0-9 Units Subcutaneous TID WC Arnaldo NatalMichael S Diamond, MD   1 Units at 09/01/16 1747  . insulin detemir (LEVEMIR) injection 12 Units  12 Units Subcutaneous QHS Arnaldo NatalMichael S Diamond, MD   12 Units at 09/01/16 2130  . levothyroxine (SYNTHROID, LEVOTHROID) tablet 50 mcg  50 mcg Oral QAC breakfast Arnaldo NatalMichael S Diamond, MD   50 mcg at 09/02/16 0701  . LORazepam (ATIVAN) tablet 0.5 mg  0.5 mg Oral TID PRN Arnaldo NatalMichael S Diamond, MD      . Melatonin TABS 5 mg  5 mg Oral QHS Arnaldo NatalMichael S Diamond, MD   5 mg at 09/01/16 2129  . metoprolol (LOPRESSOR) tablet 50 mg  50 mg Oral BID Arnaldo NatalMichael S Diamond, MD   50 mg at 09/01/16 2142  . nystatin (MYCOSTATIN/NYSTOP) topical powder 1 g  1 g Topical Daily PRN Arnaldo NatalMichael S Diamond, MD      . OLANZapine [facility](ZYPREXA) tablet 10 mg  10 mg Oral QHS Arnaldo NatalMichael S Diamond, MD   10 mg at 09/01/16 2129  . ondansetron (ZOFRAN) tablet 4 mg  4 mg Oral Q6H PRN Arnaldo NatalMichael S Diamond, MD       Or  . ondansetron [facility](ZOFRAN) injection 4 mg  4 mg Intravenous Q6H PRN Arnaldo NatalMichael S Diamond, MD      . pantoprazole (PROTONIX) EC tablet 40 mg  40 mg Oral QAC breakfast Arnaldo NatalMichael S Diamond, MD   40 mg at 09/01/16 1030  . pneumococcal 23 valent vaccine (PNU-IMMUNE) injection 0.5 mL  0.5 mL Intramuscular Tomorrow-1000 Houston SirenVivek J Sainani, MD      . sodium chloride 0.9 % bolus 1,000 mL  1,000 mL Intravenous Once Darci Currentandolph N Brown, MD      . sodium chloride flush (NS) 0.9 % injection 3 mL  3 mL Intravenous Q12H Arnaldo NatalMichael S Diamond, MD   3 mL at 09/01/16 1043  . traZODone (DESYREL) tablet 100 mg  100 mg Oral QHS Arnaldo NatalMichael S Diamond, MD   100 mg at 09/01/16 2129  . vitamin B-12 (CYANOCOBALAMIN) tablet 1,000 mcg  1,000 mcg Oral Daily Arnaldo NatalMichael S Diamond, MD   1,000 mcg at 09/01/16 1035     Discharge Medications: Please see discharge summary for a list of discharge  medications.  Relevant Imaging Results:  Relevant Lab Results:   Additional Information SSN [id number]  Darleene Cleavernterhaus, Sharryn Belding R, [place]

## 2016-09-02 NOTE — Clinical Social Work Note (Addendum)
Patient transferred to 127 from [address], this CSW updated unit CSW and completed handoff this CSW to sign off.  Ervin KnackEric R. Sherrol Vicars, MSW, Theresia MajorsLCSWA [phone]  09/02/2016 10:01 AM

## 2016-09-02 NOTE — Progress Notes (Signed)
[facility] [place] [facility]   PATIENT NAME: Danielle Boyer    MR#:  [id number]  DATE OF BIRTH:  07/06/1941  SUBJECTIVE:   Patient here after a fall and noted to have multifocal pneumonia on chest x-ray. WBC count a bit elevated today.  No fever overnight. No other events overnight.    REVIEW OF SYSTEMS:    Review of Systems  Unable to perform ROS: Dementia    Nutrition: Heart Healthy/Carb modified.  Tolerating Diet: Yes Tolerating PT: Eval noted.   DRUG ALLERGIES:   Allergies  Allergen Reactions  . Citalopram Other (See Comments)    Reaction:  Unknown   . Penicillins Rash and Other (See Comments)    Unable to obtain enough information to answer additional questions about this medication.  Has patient had a PCN reaction causing immediate rash, facial/tongue/throat swelling, SOB or lightheadedness with hypotension: Yes Has patient had a PCN reaction causing severe rash involving mucus membranes or skin necrosis: No Has patient had a PCN reaction that required hospitalization: unknown Has patient had a PCN reaction occurring within the last 10 years: unknown If all of the above answers are "NO", t    VITALS:  Blood pressure (!) 133/47, pulse 73, temperature 98.8 F (37.1 C), temperature source Oral, resp. rate 20, height 5' 3" (1.6 m), weight 66.9 kg (147 lb 8 oz), SpO2 97 %.  PHYSICAL EXAMINATION:   Physical Exam  GENERAL:  75 y.o.-year-old patient lying in bed in no acute distress.  EYES: Pupils equal, round, reactive to light and accommodation. No scleral icterus. Extraocular muscles intact.  HEENT: Head atraumatic, normocephalic. Oropharynx and nasopharynx clear.  NECK:  Supple, no jugular venous distention. No thyroid enlargement, no tenderness.  LUNGS: Normal breath sounds bilaterally, no wheezing, rales, rhonchi. No use of accessory muscles of respiration.  CARDIOVASCULAR: S1, S2 normal. No murmurs, rubs, or gallops.  ABDOMEN: Soft, nontender,  nondistended. Bowel sounds present. No organomegaly or mass.  EXTREMITIES: No cyanosis, clubbing or edema b/l.    NEUROLOGIC: Cranial nerves II through XII are intact. No focal Motor or sensory deficits b/l.  Globally weak.  PSYCHIATRIC: The patient is alert and oriented x 1.  SKIN: No obvious rash, lesion, or ulcer.    LABORATORY PANEL:   CBC  Recent Labs Lab 09/02/16 1019  WBC 19.0*  HGB 9.5*  HCT 28.3*  PLT 214   ------------------------------------------------------------------------------------------------------------------  Chemistries   Recent Labs Lab 09/01/16 0318  NA 140  K 3.7  CL 108  CO2 23  GLUCOSE 162*  BUN 20  CREATININE 0.65  CALCIUM 8.7*  AST 30  ALT 14  ALKPHOS 53  BILITOT 0.4   ------------------------------------------------------------------------------------------------------------------  Cardiac Enzymes  Recent Labs Lab 09/01/16 0318  TROPONINI <0.03   ------------------------------------------------------------------------------------------------------------------  RADIOLOGY:  Ct Head Wo Contrast  Result Date: 09/01/2016 CLINICAL DATA:  Found on floor at bedside in nursing home. Head injury. History of dementia, hypertension, diabetes. EXAM: CT HEAD WITHOUT CONTRAST TECHNIQUE: Contiguous axial images were obtained from the base of the skull through the vertex without intravenous contrast. COMPARISON:  CT HEAD May 31, 2016 FINDINGS: Mild motion degraded examination. BRAIN: The ventricles and sulci are normal for age. No intraparenchymal hemorrhage, mass effect nor midline shift. Patchy to confluent supratentorial white matter hypodensities. No acute large vascular territory infarcts. No abnormal extra-axial fluid collections. Basal cisterns are patent. VASCULAR: Moderate to severe calcific atherosclerosis of the carotid siphons and intradural vertebral artery's. SKULL: No skull fracture. No significant   scalp soft tissue swelling.  SINUSES/ORBITS: Mild paranasal sinus mucosal thickening. Mastoid air cells are well aerated. The included ocular globes and orbital contents are non-suspicious. Status post bilateral ocular lens implants. OTHER: Patient is edentulous. IMPRESSION: No acute intracranial process on this mildly motion degraded examination. Stable examination: Involutional changes and moderate chronic small vessel ischemic disease. Electronically Signed   By: Elon Alas M.D.   On: 09/01/2016 04:35   Dg Chest Portable 1 View  Result Date: 09/01/2016 CLINICAL DATA:  Hypoxia. Dyspnea. History of hypertension diabetes. EXAM: PORTABLE CHEST 1 VIEW COMPARISON:  Chest radiograph February 29, 2016 FINDINGS: Multifocal consolidation RIGHT lung. Cardiomediastinal silhouette is normal, mildly calcified aortic knob. No pleural effusion. No pneumothorax. Soft tissue planes and included osseous structures are nonsuspicious. IMPRESSION: Multifocal RIGHT pneumonia. Followup PA and lateral chest X-ray is recommended in 3-4 weeks following trial of antibiotic therapy to ensure resolution and exclude underlying malignancy. Electronically Signed   By: Elon Alas M.D.   On: 09/01/2016 03:25     ASSESSMENT AND PLAN:   76 year old female with past medical history of dementia, schizophrenia, hypertension, GERD, diabetes, history of coronary artery disease who presents to the hospital after a fall and also noted to have multifocal pneumonia on chest x-ray.  1. Sepsis-patient met sepsis criteria admission given her leukocytosis, elevated lactic acid, and chest x-ray findings suggestive of multifocal pneumonia. - BC + for Staph but likely contaminant as coag (-) staph.  MRSA PCR was (-).  - was on IV VAnco, Aztreonam but will switch to levaquin today.  WBC count trending up and will monitor. Afebrile.   2. Pneumonia-patient noted to have a multifocal pneumonia on the right side based on chest x-ray findings. Clinically she is  asymptomatic.  - switched from IV Vanco, Zosyn to Levaquin and will cont. To monitor.   3. Status post fall- seen by PT and they recommend SNF and Social Work made aware.   4. Diabetes type 2 without complication-continue Levemir, sliding scale insulin. - BS Stable.   5. Hypothyroidism-continue Synthroid.  6. Essential hypertension-continue Lopressor, Norvasc.  7. GERD-continue Protonix.  8. Dementia with history of schizophrenia-continue Zyprexa. -Continue as needed Haldol.  Possible d/c to SNF in 1-2 days.   All the records are reviewed and case discussed with Care Management/Social Worker. Management plans discussed with the patient, family and they are in agreement.  CODE STATUS: Full  DVT Prophylaxis: Lovenox  TOTAL TIME TAKING CARE OF THIS PATIENT: 25 minutes.   POSSIBLE D/C IN 1-2 DAYS, DEPENDING ON CLINICAL CONDITION.   Henreitta Leber M.D on 09/02/2016 at 1:51 PM  Between 7am to 6pm - Pager - [phone]  After 6pm go to [email or web address] - Proofreader  [facility]  Office  [phone]  CC: Primary care physician; Idelle Crouch, MD

## 2016-09-02 NOTE — Progress Notes (Signed)
Daughter Anita [name]) notified and made aware of need to tranfer off unit to Room 127.  Pt remains confused but cooperative at this time.

## 2016-09-02 NOTE — Clinical Social Work Note (Signed)
Clinical Social Work Assessment  Patient Details  Name: Danielle Boyer MRN: [id number] Date of Birth: 04/02/1941  Date of referral:  09/01/16               Reason for consult:  Facility Placement                Permission sought to share information with:  Family Supports, Magazine features editoracility Contact Representative Permission granted to share information::  Yes, Verbal Permission Granted  Name::     Danielle Boyer T Son [phone] or Modena MorrowSteele,[name]   [phone]   Agency::  ALF Admissions  Relationship::     Contact Information:     Housing/Transportation Living arrangements for the past 2 months:  Assisted Living Facility Source of Information:  Medical Team Patient Interpreter Needed:  None Criminal Activity/Legal Involvement Pertinent to Current Situation/Hospitalization:  No - Comment as needed Significant Relationships:  Adult Children Lives with:  Facility Resident ([facility]) Do you feel safe going back to the place where you live?  Yes Need for family participation in patient care:  Yes (Comment)  Care giving concerns: ALF does not express any concerns about returning back to ALF   Social Worker assessment / plan:  Patient is a 76 year old female who is alert and oriented x1 from [facility].  CSW attempted to assess patient but due to her confusing CSW spoke ALF to complete assessment.  Patient has been had [facility] for several years, patient usually walks herself to meals via walker, needs some assistance with bathing, dressing, and taking her medication.  ALF reports they do not have any issues with patient returning back to facility.  ALF reports that patient is from the regular ALF care unit not memory care unit.  ALF states that sometimes patient has walker turned around backwards after using the toilet, but normally is able to get her self to the bathroom as needed.  ALF did not express any other concerns or issues.  Employment status:   Retired Health and safety inspectornsurance information:  [place], [employer] PT Recommendations:    Information / Referral to community resources:     Patient/Family's Response to care:  Paitent plans to return back to ALF.  Patient/Family's Understanding of and Emotional Response to Diagnosis, Current Treatment, and Prognosis:  Patient has some confusion and is not aware of current treatment plan.  Emotional Assessment Appearance:  Appears stated age Attitude/Demeanor/Rapport:    Affect (typically observed):  Appropriate Orientation:  Oriented to Self Alcohol / Substance use:  Not Applicable Psych involvement (Current and /or in the community):  No (Comment)  Discharge Needs  Concerns to be addressed:  No discharge needs identified Readmission within the last 30 days:  No Current discharge risk:  None Barriers to Discharge:  No Barriers Identified   Danielle Boyer, Danielle Boyer, LCSWA 09/02/2016, 9:29 AM

## 2016-09-02 NOTE — Progress Notes (Signed)
Pt transported via bed with right hearing aid intact to Room 127.  Bed in place with alarms activated.

## 2016-09-03 LAB — CBC
HEMATOCRIT: 29 % — AB (ref 35.0–47.0)
Hemoglobin: 9.7 g/dL — ABNORMAL LOW (ref 12.0–16.0)
MCH: 31.8 pg (ref 26.0–34.0)
MCHC: 33.6 g/dL (ref 32.0–36.0)
MCV: 94.6 fL (ref 80.0–100.0)
PLATELETS: 231 10*3/uL (ref 150–440)
RBC: 3.06 MIL/uL — ABNORMAL LOW (ref 3.80–5.20)
RDW: 16.4 % — ABNORMAL HIGH (ref 11.5–14.5)
WBC: 15.9 10*3/uL — ABNORMAL HIGH (ref 3.6–11.0)

## 2016-09-03 LAB — BLOOD CULTURE ID PANEL (REFLEXED)

## 2016-09-03 LAB — GLUCOSE, CAPILLARY
Glucose-Capillary: 110 mg/dL — ABNORMAL HIGH (ref 65–99)
Glucose-Capillary: 264 mg/dL — ABNORMAL HIGH (ref 65–99)
Glucose-Capillary: 110 mg/dL — ABNORMAL HIGH (ref 65–99)
Glucose-Capillary: 203 mg/dL — ABNORMAL HIGH (ref 65–99)

## 2016-09-03 LAB — POCT CBG MONITORING
POCT GLUCOSE (MANUAL ENTRY) KUC: 110 mg/dL — AB (ref 70–99)
POCT GLUCOSE (MANUAL ENTRY) KUC: 110 mg/dL — AB (ref 70–99)

## 2016-09-03 NOTE — Progress Notes (Signed)
[place] [facility] [place] NAME: Danielle Boyer    MR#:  [id number]  DATE OF BIRTH:  05-Apr-1941  SUBJECTIVE:   Patient here after a fall and noted to have multifocal pneumonia on chest x-ray. WBC count improved today. No fever overnight.  No acute symptoms. PT recommending SNF and Social worker aware.   REVIEW OF SYSTEMS:    Review of Systems  Unable to perform ROS: Dementia    Nutrition: Heart Healthy/Carb modified.  Tolerating Diet: Yes Tolerating PT: Eval noted.   DRUG ALLERGIES:   Allergies  Allergen Reactions  . Citalopram Other (See Comments)    Reaction:  Unknown   . Penicillins Rash and Other (See Comments)    Unable to obtain enough information to answer additional questions about this medication.  Has patient had a PCN reaction causing immediate rash, facial/tongue/throat swelling, SOB or lightheadedness with hypotension: Yes Has patient had a PCN reaction causing severe rash involving mucus membranes or skin necrosis: No Has patient had a PCN reaction that required hospitalization: unknown Has patient had a PCN reaction occurring within the last 10 years: unknown If all of the above answers are "NO", t    VITALS:  Blood pressure (!) 128/47, pulse 72, temperature 97.8 F (36.6 C), temperature source Oral, resp. rate 18, height 5' 3"  (1.6 m), weight 67 kg (147 lb 9.6 oz), SpO2 95 %.  PHYSICAL EXAMINATION:   Physical Exam  GENERAL:  76 y.o.-year-old patient sitting up in chair in no acute distress.  EYES: Pupils equal, round, reactive to light and accommodation. No scleral icterus. Extraocular muscles intact.  HEENT: Head atraumatic, normocephalic. Oropharynx and nasopharynx clear.  NECK:  Supple, no jugular venous distention. No thyroid enlargement, no tenderness.  LUNGS: Normal breath sounds bilaterally, no wheezing, rales, rhonchi. No use of accessory muscles of respiration.  CARDIOVASCULAR: S1, S2 normal. No murmurs, rubs, or  gallops.  ABDOMEN: Soft, nontender, nondistended. Bowel sounds present. No organomegaly or mass.  EXTREMITIES: No cyanosis, clubbing or edema b/l.    NEUROLOGIC: Cranial nerves II through XII are intact. No focal Motor or sensory deficits b/l.  Globally weak.  PSYCHIATRIC: The patient is alert and oriented x 1.  SKIN: No obvious rash, lesion, or ulcer.    LABORATORY PANEL:   CBC  Recent Labs Lab 09/03/16 0428  WBC 15.9*  HGB 9.7*  HCT 29.0*  PLT 231   ------------------------------------------------------------------------------------------------------------------  Chemistries   Recent Labs Lab 09/01/16 0318  NA 140  K 3.7  CL 108  CO2 23  GLUCOSE 162*  BUN 20  CREATININE 0.65  CALCIUM 8.7*  AST 30  ALT 14  ALKPHOS 53  BILITOT 0.4   ------------------------------------------------------------------------------------------------------------------  Cardiac Enzymes  Recent Labs Lab 09/01/16 0318  TROPONINI <0.03   ------------------------------------------------------------------------------------------------------------------  RADIOLOGY:  No results found.   ASSESSMENT AND PLAN:   76 year old female with past medical history of dementia, schizophrenia, hypertension, GERD, diabetes, history of coronary artery disease who presents to the hospital after a fall and also noted to have multifocal pneumonia on chest x-ray.  1. Sepsis-patient met sepsis criteria admission given her leukocytosis, elevated lactic acid, and chest x-ray findings suggestive of multifocal pneumonia. - BC + for Staph but likely contaminant as coag (-) staph.  MRSA PCR was (-).  - cont. Levaquin and WBC count improving.  No fever.   2. Pneumonia-patient noted to have a multifocal pneumonia on the right side based on chest x-ray findings. Clinically she is  asymptomatic.  - cont. LEvaquin and improving.  NO acute events overnight.   3. Status post fall- seen by PT and they recommend SNF  and Social Work aware.   4. Diabetes type 2 without complication-continue Levemir, sliding scale insulin. - BS Stable.   5. Hypothyroidism-continue Synthroid.  6. Essential hypertension-continue Lopressor, Norvasc.  7. GERD-continue Protonix.  8. Dementia with history of schizophrenia-continue Zyprexa. -Continue as needed Haldol.  Possible d/c to SNF today or tomorrow if bed available.   All the records are reviewed and case discussed with Care Management/Social Worker. Management plans discussed with the patient, family and they are in agreement.  CODE STATUS: Full  DVT Prophylaxis: Lovenox  TOTAL TIME TAKING CARE OF THIS PATIENT: 25 minutes.   POSSIBLE D/C IN 1-2 DAYS, DEPENDING ON CLINICAL CONDITION.   Henreitta Leber M.D on 09/03/2016 at 12:37 PM  Between 7am to 6pm - Pager - [phone]  After 6pm go to [email or web address] - Proofreader  [facility]  Office  [phone]  CC: Primary care physician; Idelle Crouch, MD

## 2016-09-03 NOTE — NC FL2 (Signed)
[place] MEDICAID FL2 LEVEL OF CARE SCREENING TOOL     IDENTIFICATION  Patient Name: Danielle Boyer Birthdate: [birth year] Sex: female Admission Date (Current Location): 09/01/2016  [place] and [place] Number:  [address] [id number] [facility] Facility and Address:  [facility], [address], [place], [place] [address]      Provider Number: [id number]  Attending Physician Name and Address:  Houston Siren, MD  Relative Name and Phone Number:  Evlyn Clines [phone] or Modena Morrow   [phone]     Current Level of Care: Hospital Recommended Level of Care: Skilled Nursing Facility Prior Approval Number:    Date Approved/Denied:   PASRR Number: [id number]  Discharge Plan: SNF    Current Diagnoses: Patient Active Problem List   Diagnosis Date Noted  . Sepsis (HCC) 09/01/2016  . Closed left hip fracture (HCC) 02/29/2016  . 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 Essential hypertension 06/07/2015  . Hearing loss 06/07/2015  . Hypothyroidism 06/07/2015  . Cardiovascular disease 06/07/2015  . Atrial fibrillation (HCC) 06/07/2015  . GERD (gastroesophageal reflux disease) 06/07/2015  . IBS (irritable bowel syndrome) 06/07/2015  . Osteoarthritis 06/07/2015  . Paranoid schizophrenia (HCC)   . Schizophrenia (HCC) 06/06/2015  . Diabetes (HCC) 03/19/2015    Orientation RESPIRATION BLADDER Height & Weight     Self  Normal Continent Weight: 147 lb 9.6 oz (67 kg) Height:  5\' 3"  (160 cm)  BEHAVIORAL SYMPTOMS/MOOD NEUROLOGICAL BOWEL NUTRITION STATUS      Continent Diet  AMBULATORY STATUS COMMUNICATION OF NEEDS Skin   Limited Assist Verbally Normal                       Personal Care Assistance Level of Assistance  Bathing, Feeding, Dressing Bathing Assistance: Limited assistance Feeding assistance: Independent Dressing Assistance: Limited assistance     Functional Limitations Info  Sight, Hearing, Speech  Sight Info: Adequate Hearing Info: Adequate Speech Info: Adequate    SPECIAL CARE FACTORS FREQUENCY  PT (By licensed PT)     PT Frequency: 5              Contractures Contractures Info: Not present    Additional Factors Info  Code Status, Allergies, Psychotropic, Insulin Sliding Scale Code Status Info: Full Code Allergies Info: Citalopram, Penicillins Psychotropic Info: Medications:  Zyprexa Insulin Sliding Scale Info: 3xday       Current Medications (09/03/2016):  This is the current hospital active medication list Current Facility-Administered Medications  Medication Dose Route Frequency Provider Last Rate Last Dose  . acetaminophen (TYLENOL) tablet 650 mg  650 mg Oral Q6H PRN Arnaldo Natal, MD   650 mg at 09/01/16 1409   Or  . acetaminophen (TYLENOL) suppository 650 mg  650 mg Rectal Q6H PRN Arnaldo Natal, MD      . amLODipine (NORVASC) tablet 10 mg  10 mg Oral Daily Arnaldo Natal, MD   10 mg at 09/03/16 0859  . aspirin chewable tablet 324 mg  324 mg Oral Daily Arnaldo Natal, MD   324 mg at 09/03/16 0859  . atorvastatin (LIPITOR) tablet 40 mg  40 mg Oral q1800 Arnaldo Natal, MD   40 mg at 09/02/16 1720  . docusate sodium (COLACE) capsule 100 mg  100 mg Oral BID Arnaldo Natal, MD   100 mg at 09/03/16 0859  . enoxaparin (LOVENOX) injection 40 mg  40 mg Subcutaneous Q24H Arnaldo Natal, MD   40 mg at 09/03/16  [id number]  . ferrous sulfate tablet 325 mg  325 mg Oral TID PC Arnaldo NatalMichael S Diamond, MD   325 mg at 09/03/16 1155  . haloperidol lactate (HALDOL) injection 1 mg  1 mg Intravenous Q6H PRN Arnaldo NatalMichael S Diamond, MD      . HYDROcodone-acetaminophen (NORCO/VICODIN) 5-325 MG per tablet 1 tablet  1 tablet Oral Q4H PRN Arnaldo NatalMichael S Diamond, MD      . insulin aspart (novoLOG) injection 0-9 Units  0-9 Units Subcutaneous TID WC Arnaldo NatalMichael S Diamond, MD   5 Units at 09/03/16 1154  . insulin detemir (LEVEMIR) injection 12 Units  12 Units Subcutaneous QHS Arnaldo NatalMichael S Diamond,  MD   12 Units at 09/02/16 2151  . levofloxacin (LEVAQUIN) tablet 500 mg  500 mg Oral Daily Houston SirenVivek J Sainani, MD   500 mg at 09/03/16 0859  . levothyroxine (SYNTHROID, LEVOTHROID) tablet 50 mcg  50 mcg Oral QAC breakfast Arnaldo NatalMichael S Diamond, MD   50 mcg at 09/03/16 0551  . LORazepam (ATIVAN) tablet 0.5 mg  0.5 mg Oral TID PRN Arnaldo NatalMichael S Diamond, MD      . Melatonin TABS 5 mg  5 mg Oral QHS Arnaldo NatalMichael S Diamond, MD   5 mg at 09/02/16 2135  . metoprolol (LOPRESSOR) tablet 50 mg  50 mg Oral BID Arnaldo NatalMichael S Diamond, MD   50 mg at 09/03/16 0859  . nystatin (MYCOSTATIN/NYSTOP) topical powder 1 g  1 g Topical Daily PRN Arnaldo NatalMichael S Diamond, MD      . OLANZapine [facility](ZYPREXA) tablet 10 mg  10 mg Oral QHS Arnaldo NatalMichael S Diamond, MD   10 mg at 09/02/16 2134  . ondansetron (ZOFRAN) tablet 4 mg  4 mg Oral Q6H PRN Arnaldo NatalMichael S Diamond, MD       Or  . ondansetron [facility](ZOFRAN) injection 4 mg  4 mg Intravenous Q6H PRN Arnaldo NatalMichael S Diamond, MD      . pantoprazole (PROTONIX) EC tablet 40 mg  40 mg Oral QAC breakfast Arnaldo NatalMichael S Diamond, MD   40 mg at 09/03/16 0758  . sodium chloride 0.9 % bolus 1,000 mL  1,000 mL Intravenous Once Darci Currentandolph N Brown, MD      . sodium chloride flush (NS) 0.9 % injection 3 mL  3 mL Intravenous Q12H Arnaldo NatalMichael S Diamond, MD   3 mL at 09/03/16 0858  . traZODone (DESYREL) tablet 100 mg  100 mg Oral QHS Arnaldo NatalMichael S Diamond, MD   100 mg at 09/02/16 2135  . vitamin B-12 (CYANOCOBALAMIN) tablet 1,000 mcg  1,000 mcg Oral Daily Arnaldo NatalMichael S Diamond, MD   1,000 mcg at 09/03/16 [id number]     Discharge Medications: Please see discharge summary for a list of discharge medications.  Relevant Imaging Results:  Relevant Lab Results:   Additional Information SSN:  [id number]  Dede QuerySarah Maritssa Haughton, LCSW

## 2016-09-03 NOTE — Clinical Social Work Note (Addendum)
PT is recommending SNF placement. CSW reached out to pt's son to address discharge plan. Per pt's son, pt's daughter, Synetta Failnita, is the Management consultantdecision maker. CSW attempted to reach her, and was able to. CSW received a phone call from pt's son and he gave permission to initiaited the SNF search, and then he would have daughter call CSW. At time of note, CSW is waiting for a phone call from daughter. CSW will continue to follow.   Dede QuerySarah Zyon Rosser, MSW, LCSW  Clinical Social Worker  [phone]  ADDENDUM: CSW spoke with pt's daughter, and provided bed offers. Pt's daughter will speak to her brother about placement. CSW stated that pt will be ready for discharge tomorrow. CSW will continue to follow.   Dede QuerySarah Gelila Well, MSW, LCSW  Clinical Social Worker  [phone]

## 2016-09-04 LAB — CULTURE, BLOOD (ROUTINE X 2)

## 2016-09-04 LAB — GLUCOSE, CAPILLARY
Glucose-Capillary: 136 mg/dL — ABNORMAL HIGH (ref 65–99)
Glucose-Capillary: 205 mg/dL — ABNORMAL HIGH (ref 65–99)
Glucose-Capillary: 144 mg/dL — ABNORMAL HIGH (ref 65–99)

## 2016-09-04 MED ORDER — LORAZEPAM 0.5 MG PO TABS: 0.5000 mg | ORAL_TABLET | Freq: Four times a day (QID) | ORAL | 0 refills | Status: AC | PRN

## 2016-09-04 MED ORDER — HYDROCODONE-ACETAMINOPHEN 5-325 MG PO TABS: 1.0000 | ORAL_TABLET | ORAL | 0 refills | Status: DC | PRN

## 2016-09-04 MED ORDER — LEVOFLOXACIN 500 MG PO TABS: 500.0000 mg | ORAL_TABLET | Freq: Every day | ORAL | 0 refills | Status: AC

## 2016-09-04 NOTE — Discharge Summary (Signed)
[name]    MR#:  [id number]  DATE OF BIRTH:  1941-01-29  DATE OF ADMISSION:  09/01/2016 ADMITTING PHYSICIAN: Harrie Foreman, MD  DATE OF DISCHARGE: 09/04/2016  PRIMARY CARE PHYSICIAN: SPARKS,JEFFREY D, MD    ADMISSION DIAGNOSIS:  HCAP (healthcare-associated pneumonia) [J18.9] Sepsis, due to unspecified organism ([place]) [A41.9]  DISCHARGE DIAGNOSIS:  Active Problems:   Sepsis ([place])   SECONDARY DIAGNOSIS:   Past Medical History:  Diagnosis Date  . CAD (coronary artery disease)   . Dementia   . 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 GERD (gastroesophageal reflux disease)   . Hip fracture ([place])   . Hypertension   . Hypokalemia   . Schizophrenia ([place])   . Thyroid disease    hypo    HOSPITAL COURSE:   76 year old female with past medical history of dementia, schizophrenia, hypertension, GERD, diabetes, history of coronary artery disease who presents to the hospital after a fall and also noted to have multifocal pneumonia on chest x-ray.  1. Sepsis-patient met sepsis criteria on admission given her leukocytosis, elevated lactic acid, and chest x-ray findings suggestive of multifocal pneumonia. - BC + for Staph but likely contaminant as it was coag (-) staph.  MRSA PCR was (-).  - initially pt. Was treated with Broad spectrum Abx with Vanco, Aztreonam and then switched to Levaquin and being discharged on that.  She is clinically doing much better with No fever and no acute resp. Symptoms.   2. Pneumonia-patient noted to have a multifocal pneumonia on the right side based on chest x-ray findings. Clinically she was asymptomatic.  - initially pt. Was treated with IV Vanco, aztreonam but now being discharged on Oral Levaquin.   3. Status post fall- seen by PT and they recommend SNF and pt. Is being discharged there presently.   4. Diabetes type 2 without complication- pt. Will resume  her Metformin, Levemir upon discharge.   5. Hypothyroidism- she will continue Synthroid.  6. Essential hypertension- she will continue Lopressor, Norvasc.  7. GERD- she will resume her Omeprazole.   8. Dementia with history of schizophrenia- she will continue Zyprexa.   DISCHARGE CONDITIONS:   Stable  CONSULTS OBTAINED:    DRUG ALLERGIES:   Allergies  Allergen Reactions  . Citalopram Other (See Comments)    Reaction:  Unknown   . Penicillins Rash and Other (See Comments)    Unable to obtain enough information to answer additional questions about this medication.  Has patient had a PCN reaction causing immediate rash, facial/tongue/throat swelling, SOB or lightheadedness with hypotension: Yes Has patient had a PCN reaction causing severe rash involving mucus membranes or skin necrosis: No Has patient had a PCN reaction that required hospitalization: unknown Has patient had a PCN reaction occurring within the last 10 years: unknown If all of the above answers are "NO", t    DISCHARGE MEDICATIONS:   Allergies as of 09/04/2016      Reactions   Citalopram Other (See Comments)   Reaction:  Unknown    Penicillins Rash, Other (See Comments)   Unable to obtain enough information to answer additional questions about this medication.  Has patient had a PCN reaction causing immediate rash, facial/tongue/throat swelling, SOB or lightheadedness with hypotension: Yes Has patient had a PCN reaction causing severe rash involving mucus membranes or skin necrosis: No Has patient had a PCN reaction that required hospitalization: unknown Has patient  had a PCN reaction occurring within the last 10 years: unknown If all of the above answers are "NO", t      Medication List    STOP taking these medications   enoxaparin 30 MG/0.3ML injection Commonly known as:  LOVENOX     TAKE these medications   acetaminophen 500 MG tablet Commonly known as:  TYLENOL Take 500 mg by mouth 3 (three)  times daily. Take at 8 am, 2 pm, and 6 pm. *Not to exceed 3 gm/24hr*   amLODipine 10 MG tablet Commonly known as:  NORVASC Take 10 mg by mouth daily.   aspirin 81 MG chewable tablet Chew 324 mg by mouth daily.   atorvastatin 40 MG tablet Commonly known as:  LIPITOR Take 1 tablet (40 mg total) by mouth daily at 6 PM.   bisacodyl 5 MG EC tablet Commonly known as:  DULCOLAX Take 1 tablet (5 mg total) by mouth daily as needed for moderate constipation.   cyanocobalamin 1000 MCG tablet Take 1 tablet (1,000 mcg total) by mouth daily.   docusate sodium 100 MG capsule Commonly known as:  COLACE Take 1 capsule (100 mg total) by mouth 2 (two) times daily.   ferrous sulfate 325 (65 FE) MG tablet Take 1 tablet (325 mg total) by mouth 3 (three) times daily after meals.   HYDROcodone-acetaminophen 5-325 MG tablet Commonly known as:  NORCO/VICODIN Take 1 tablet by mouth every 4 (four) hours as needed for moderate pain.   insulin detemir 100 UNIT/ML injection Commonly known as:  LEVEMIR Inject 0.2 mLs (20 Units total) into the skin daily. What changed:  how much to take  when to take this   levofloxacin 500 MG tablet Commonly known as:  LEVAQUIN Take 1 tablet (500 mg total) by mouth daily. Start taking on:  09/05/2016   levothyroxine 50 MCG tablet Commonly known as:  SYNTHROID, LEVOTHROID Take 1 tablet (50 mcg total) by mouth daily before breakfast.   LORazepam 0.5 MG tablet Commonly known as:  ATIVAN Take 1 tablet (0.5 mg total) by mouth every 6 (six) hours as needed for anxiety. And /or agitation. What changed:  when to take this   Melatonin 3 MG Tabs Take 6 mg by mouth at bedtime.   metFORMIN 500 MG tablet Commonly known as:  GLUCOPHAGE Take 1,000 mg by mouth 2 (two) times daily with a meal.   metoprolol 50 MG tablet Commonly known as:  LOPRESSOR Take 50 mg by mouth 2 (two) times daily.   nystatin powder Generic drug:  nystatin Apply 1 g topically daily as needed.  For rash.   OLANZapine 10 MG tablet Commonly known as:  ZYPREXA Take 1 tablet (10 mg total) by mouth at bedtime.   omeprazole 20 MG capsule Commonly known as:  PRILOSEC Take 20 mg by mouth daily.   traZODone 100 MG tablet Commonly known as:  DESYREL Take 100 mg by mouth at bedtime.         DISCHARGE INSTRUCTIONS:   DIET:  Cardiac diet and Diabetic diet  DISCHARGE CONDITION:  Stable  ACTIVITY:  Activity as tolerated  OXYGEN:  Home Oxygen: No.   Oxygen Delivery: room air  DISCHARGE LOCATION:  nursing home   If you experience worsening of your admission symptoms, develop shortness of breath, life threatening emergency, suicidal or homicidal thoughts you must seek medical attention immediately by calling 911 or calling your MD immediately  if symptoms less severe.  You Must read complete instructions/literature along with all the  possible adverse reactions/side effects for all the Medicines you take and that have been prescribed to you. Take any new Medicines after you have completely understood and accpet all the possible adverse reactions/side effects.   Please note  You were cared for by a hospitalist during your hospital stay. If you have any questions about your discharge medications or the care you received while you were in the hospital after you are discharged, you can call the unit and asked to speak with the hospitalist on call if the hospitalist that took care of you is not available. Once you are discharged, your primary care physician will handle any further medical issues. Please note that NO REFILLS for any discharge medications will be authorized once you are discharged, as it is imperative that you return to your primary care physician (or establish a relationship with a primary care physician if you do not have one) for your aftercare needs so that they can reassess your need for medications and monitor your lab values.     Today   No acute symptoms  overnight.  No new complaints.  Afebrile, no shortness of breath, CP.   VITAL SIGNS:  Blood pressure (!) 149/54, pulse 78, temperature 98.4 F (36.9 C), temperature source Oral, resp. rate 18, height _0  (1.6 m), weight 66.9 kg (147 lb 8 oz), SpO2 97 %.  I/O:   Intake/Output Summary (Last 24 hours) at 09/04/16 1135 Last data filed at 09/04/16 0939  Gross per 24 hour  Intake             1200 ml  Output                0 ml  Net             1200 ml    PHYSICAL EXAMINATION:  GENERAL:  76 y.o.-year-old patient lying in the bed with no acute distress.  EYES: Pupils equal, round, reactive to light and accommodation. No scleral icterus. Extraocular muscles intact.  HEENT: Head atraumatic, normocephalic. Oropharynx and nasopharynx clear.  NECK:  Supple, no jugular venous distention. No thyroid enlargement, no tenderness.  LUNGS: Normal breath sounds bilaterally, no wheezing, rales,rhonchi. No use of accessory muscles of respiration.  CARDIOVASCULAR: S1, S2 normal. No murmurs, rubs, or gallops.  ABDOMEN: Soft, non-tender, non-distended. Bowel sounds present. No organomegaly or mass.  EXTREMITIES: No pedal edema, cyanosis, or clubbing.  NEUROLOGIC: Cranial nerves II through XII are intact. No focal motor or sensory defecits b/l.  PSYCHIATRIC: The patient is alert and oriented x 1.   SKIN: No obvious rash, lesion, or ulcer.   DATA REVIEW:   CBC  Recent Labs Lab 09/03/16 0428  WBC 15.9*  HGB 9.7*  HCT 29.0*  PLT 231    Chemistries   Recent Labs Lab 09/01/16 0318  NA 140  K 3.7  CL 108  CO2 23  GLUCOSE 162*  BUN 20  CREATININE 0.65  CALCIUM 8.7*  AST 30  ALT 14  ALKPHOS 53  BILITOT 0.4    Cardiac Enzymes  Recent Labs Lab 09/01/16 0318  TROPONINI <0.03    Microbiology Results  Results for orders placed or performed during the hospital encounter of 09/01/16  Blood Culture (routine x 2)     Status: None (Preliminary result)   Collection Time: 09/01/16  4:25 AM   Result Value Ref Range Status   Specimen Description BLOOD LAC  Final   Special Requests BOTTLES DRAWN AEROBIC AND ANAEROBIC BCAV  Final  Culture NO GROWTH 3 DAYS  Final   Report Status PENDING  Incomplete  Blood Culture (routine x 2)     Status: Abnormal   Collection Time: 09/01/16  4:25 AM  Result Value Ref Range Status   Specimen Description BLOOD L FA  Final   Special Requests BOTTLES DRAWN AEROBIC AND ANAEROBIC BCAV  Final   Culture  Setup Time   Final    ANAEROBIC BOTTLE ONLY GRAM POSITIVE COCCI CRITICAL RESULT CALLED TO, READ BACK BY AND VERIFIED WITH: [place] ON 09/02/16 AT 0103 BY TLB GRAM POSITIVE RODS AEROBIC BOTTLE ONLY CRITICAL RESULT CALLED TO, READ BACK BY AND VERIFIED WITH: [place] AT 1040 09/03/16 SDR    Culture (A)  Final    STAPHYLOCOCCUS SPECIES (COAGULASE NEGATIVE) THE SIGNIFICANCE OF ISOLATING THIS ORGANISM FROM A SINGLE SET OF BLOOD CULTURES WHEN MULTIPLE SETS ARE DRAWN IS UNCERTAIN. PLEASE NOTIFY THE MICROBIOLOGY DEPARTMENT WITHIN ONE WEEK IF SPECIATION AND SENSITIVITIES ARE REQUIRED. DIPHTHEROIDS(CORYNEBACTERIUM SPECIES) Standardized susceptibility testing for this organism is not available. Performed at [facility], [place] [address] [place], [place] [address]    Report Status 09/04/2016 FINAL  Final  Blood Culture ID Panel (Reflexed)     Status: Abnormal   Collection Time: 09/01/16  4:25 AM  Result Value Ref Range Status   Enterococcus species NOT DETECTED NOT DETECTED Final   Listeria monocytogenes NOT DETECTED NOT DETECTED Final   Staphylococcus species DETECTED (A) NOT DETECTED Final    Comment: Methicillin (oxacillin) susceptible coagulase negative staphylococcus. Possible blood culture contaminant (unless isolated from more than one blood culture draw or clinical case suggests pathogenicity). No antibiotic treatment is indicated for blood  culture contaminants. CRITICAL RESULT CALLED TO, READ BACK BY AND VERIFIED WITH: NATE COOKSON ON  09/03/15 AT 0103 BY TLB    Staphylococcus aureus NOT DETECTED NOT DETECTED Final   Methicillin resistance NOT DETECTED NOT DETECTED Final   Streptococcus species NOT DETECTED NOT DETECTED Final   Streptococcus agalactiae NOT DETECTED NOT DETECTED Final   Streptococcus pneumoniae NOT DETECTED NOT DETECTED Final   Streptococcus pyogenes NOT DETECTED NOT DETECTED Final   Acinetobacter baumannii NOT DETECTED NOT DETECTED Final   Enterobacteriaceae species NOT DETECTED NOT DETECTED Final   Enterobacter cloacae complex NOT DETECTED NOT DETECTED Final   Escherichia coli NOT DETECTED NOT DETECTED Final   Klebsiella oxytoca NOT DETECTED NOT DETECTED Final   Klebsiella pneumoniae NOT DETECTED NOT DETECTED Final   Proteus species NOT DETECTED NOT DETECTED Final   Serratia marcescens NOT DETECTED NOT DETECTED Final   Haemophilus influenzae NOT DETECTED NOT DETECTED Final   Neisseria meningitidis NOT DETECTED NOT DETECTED Final   Pseudomonas aeruginosa NOT DETECTED NOT DETECTED Final   Candida albicans NOT DETECTED NOT DETECTED Final   Candida glabrata NOT DETECTED NOT DETECTED Final   Candida krusei NOT DETECTED NOT DETECTED Final   Candida parapsilosis NOT DETECTED NOT DETECTED Final   Candida tropicalis NOT DETECTED NOT DETECTED Final  Blood Culture ID Panel (Reflexed)     Status: None   Collection Time: 09/01/16  4:25 AM  Result Value Ref Range Status   Enterococcus species NOT DETECTED NOT DETECTED Final   Listeria monocytogenes NOT DETECTED NOT DETECTED Final   Staphylococcus species NOT DETECTED NOT DETECTED Final   Staphylococcus aureus NOT DETECTED NOT DETECTED Final   Streptococcus species NOT DETECTED NOT DETECTED Final   Streptococcus agalactiae NOT DETECTED NOT DETECTED Final   Streptococcus pneumoniae NOT DETECTED NOT DETECTED Final  Streptococcus pyogenes NOT DETECTED NOT DETECTED Final   Acinetobacter baumannii NOT DETECTED NOT DETECTED Final   Enterobacteriaceae species NOT  DETECTED NOT DETECTED Final   Enterobacter cloacae complex NOT DETECTED NOT DETECTED Final   Escherichia coli NOT DETECTED NOT DETECTED Final   Klebsiella oxytoca NOT DETECTED NOT DETECTED Final   Klebsiella pneumoniae NOT DETECTED NOT DETECTED Final   Proteus species NOT DETECTED NOT DETECTED Final   Serratia marcescens NOT DETECTED NOT DETECTED Final   Haemophilus influenzae NOT DETECTED NOT DETECTED Final   Neisseria meningitidis NOT DETECTED NOT DETECTED Final   Pseudomonas aeruginosa NOT DETECTED NOT DETECTED Final   Candida albicans NOT DETECTED NOT DETECTED Final   Candida glabrata NOT DETECTED NOT DETECTED Final   Candida krusei NOT DETECTED NOT DETECTED Final   Candida parapsilosis NOT DETECTED NOT DETECTED Final   Candida tropicalis NOT DETECTED NOT DETECTED Final  MRSA PCR Screening     Status: None   Collection Time: 09/01/16 11:15 AM  Result Value Ref Range Status   MRSA by PCR NEGATIVE NEGATIVE Final    Comment:        The GeneXpert MRSA Assay (FDA approved for NASAL specimens only), is one component of a comprehensive MRSA colonization surveillance program. It is not intended to diagnose MRSA infection nor to guide or monitor treatment for MRSA infections.     RADIOLOGY:  No results found.    Management plans discussed with the patient, family and they are in agreement.  CODE STATUS:     Code Status Orders        Start     Ordered   09/01/16 0654  Full code  Continuous     09/01/16 0653        Advance Directive Documentation   Flowsheet Row Most Recent Value  Type of Advance Directive  Healthcare Power of Attorney  Pre-existing out of facility DNR order (yellow form or pink MOST form)  No data  "MOST" Form in Place?  No data      TOTAL TIME TAKING CARE OF THIS PATIENT: 40 minutes.    Henreitta Leber M.D on 09/04/2016 at 11:35 AM  Between 7am to 6pm - Pager - [phone]  After 6pm go to [email or web address] - Proofreader  [employer] [facility]  Office  [phone]  CC: Primary care physician; Idelle Crouch, MD

## 2016-09-04 NOTE — Care Management Important Message (Signed)
Important Message  Patient Details  Name: Danielle Boyer MRN: [id number] Date of Birth: 02/14/1941   Medicare Important Message Given:  Yes    Gwenette GreetBrenda S Brandelyn Henne, RN 09/04/2016, 8:45 AM

## 2016-09-04 NOTE — Clinical Social Work Note (Signed)
Patient to be d/c'ed today to [facility].  Patient and family agreeable to plans will transport via ems, patient going to ALF no need to call report.  Danielle MouldingEric Greyson Peavy, MSW, Theresia MajorsLCSWA [phone]

## 2016-09-04 NOTE — Progress Notes (Signed)
Attempted to locate the [facility] facility pt is suppose to be going after EMS called asking which facility to transport pt. The daughter reported that pt should be going to the facility at [address], [place] and the number was [phone]. Facility was called and facility verified they are expecting the pt. Transport was called to report the address of facility pt was going.

## 2016-09-05 NOTE — Progress Notes (Signed)
Pt left the floor at 0025 via EMS.

## 2016-09-06 LAB — CULTURE, BLOOD (ROUTINE X 2): CULTURE: NO GROWTH

## 2017-02-10 ENCOUNTER — Emergency Department
Admission: EM | Admit: 2017-02-10 | Discharge: 2017-02-10 | Disposition: A | Discharge status: Home / Self Care | Payer: Medicare Other | Attending: Emergency Medicine | Admitting: Emergency Medicine

## 2017-02-10 ENCOUNTER — Emergency Department: Payer: Medicare Other

## 2017-02-10 DIAGNOSIS — E119 Type 2 diabetes mellitus without complications: Secondary | ICD-10-CM | POA: Diagnosis not present

## 2017-02-10 DIAGNOSIS — E039 Hypothyroidism, unspecified: Secondary | ICD-10-CM | POA: Insufficient documentation

## 2017-02-10 DIAGNOSIS — Z7984 Long term (current) use of oral hypoglycemic drugs: Secondary | ICD-10-CM | POA: Diagnosis not present

## 2017-02-10 DIAGNOSIS — I10 Essential (primary) hypertension: Secondary | ICD-10-CM | POA: Insufficient documentation

## 2017-02-10 DIAGNOSIS — G309 Alzheimer's disease, unspecified: Secondary | ICD-10-CM | POA: Diagnosis not present

## 2017-02-10 DIAGNOSIS — S139XXA Sprain of joints and ligaments of unspecified parts of neck, initial encounter: Secondary | ICD-10-CM | POA: Diagnosis not present

## 2017-02-10 DIAGNOSIS — Z79899 Other long term (current) drug therapy: Secondary | ICD-10-CM | POA: Diagnosis not present

## 2017-02-10 DIAGNOSIS — Z7982 Long term (current) use of aspirin: Secondary | ICD-10-CM | POA: Diagnosis not present

## 2017-02-10 DIAGNOSIS — S52022A Displaced fracture of olecranon process without intraarticular extension of left ulna, initial encounter for closed fracture: Secondary | ICD-10-CM | POA: Diagnosis not present

## 2017-02-10 DIAGNOSIS — W19XXXA Unspecified fall, initial encounter: Secondary | ICD-10-CM

## 2017-02-10 DIAGNOSIS — Y939 Activity, unspecified: Secondary | ICD-10-CM | POA: Insufficient documentation

## 2017-02-10 DIAGNOSIS — W06XXXA Fall from bed, initial encounter: Secondary | ICD-10-CM | POA: Diagnosis not present

## 2017-02-10 DIAGNOSIS — Y929 Unspecified place or not applicable: Secondary | ICD-10-CM | POA: Insufficient documentation

## 2017-02-10 DIAGNOSIS — S42292A Other displaced fracture of upper end of left humerus, initial encounter for closed fracture: Secondary | ICD-10-CM | POA: Diagnosis not present

## 2017-02-10 DIAGNOSIS — Y999 Unspecified external cause status: Secondary | ICD-10-CM | POA: Insufficient documentation

## 2017-02-10 DIAGNOSIS — I2510 Atherosclerotic heart disease of native coronary artery without angina pectoris: Secondary | ICD-10-CM | POA: Diagnosis not present

## 2017-02-10 DIAGNOSIS — S59912A Unspecified injury of left forearm, initial encounter: Secondary | ICD-10-CM | POA: Diagnosis present

## 2017-02-10 MED ORDER — OXYCODONE HCL 5 MG PO TABS: 5.0000 mg | ORAL_TABLET | Freq: Four times a day (QID) | ORAL | 0 refills | Status: AC | PRN

## 2017-02-10 MED ORDER — ACETAMINOPHEN 500 MG PO TABS
1000.0000 mg | ORAL_TABLET | Freq: Once | ORAL | Status: AC
  Administered 2017-02-10: 1000 mg via ORAL
  Filled 2017-02-10: qty 2

## 2017-02-10 MED ORDER — OXYCODONE HCL 5 MG PO TABS
5.0000 mg | ORAL_TABLET | Freq: Once | ORAL | Status: AC
  Administered 2017-02-10: 5 mg via ORAL
  Filled 2017-02-10: qty 1

## 2017-02-10 NOTE — ED Notes (Signed)
Pt left upper arm is severely bruised and she is unable to move the arm without pain - she has good pulses in left wrist and is able to make a fist and move all her fingers - capillary refill is wnl

## 2017-02-10 NOTE — ED Notes (Signed)
Patient transported to X-ray 

## 2017-02-10 NOTE — Discharge Instructions (Signed)
Pain control: Take tylenol 1000mg  every 8 hours. Take 5mg  of oxycodone every 6 hours for breakthrough pain. If you need the oxycodone make sure to take one senokot as well to prevent constipation.  Do not drink alcohol, drive or participate in any other potentially dangerous activities while taking this medication as it may make you sleepy. Do not take this medication with any other sedating medications, either prescription or over-the-counter.   Keep splint in place, dry and clean. Return to the ER for worsening pain, discoloration of the fingers, or any new symptoms. Follow up with your orthopedist tomorrow for recheck.

## 2017-02-10 NOTE — ED Triage Notes (Signed)
Pt arrived via ems for c/o fall this am while getting out of bed at [facility] - pt c/o left upper arm pain and obvious deformity noted with bruising - pt states she hit her head and that she is having neck pain - pt c/o left leg pain but reports that she broke her leg 2 years ago - pt is oriented to person, place, and month

## 2017-02-10 NOTE — ED Provider Notes (Signed)
[facility] Emergency Department Provider Note  ____________________________________________  Time seen: Approximately 6:36 PM  I have reviewed the triage vital signs and the nursing notes.   HISTORY  Chief Complaint Arm Pain and Fall   HPI Danielle Boyer is a 76 y.o. female with a history of dementia, diabs, hypertension, schizophrenia who presents for evaluationof left arm pain status post mechanical fall. Patient reports that she rolled out of bed onto the floor this morning. She reports that she hit her head onto the floor but denies LOC. She has been waiting for portable x-ray to evaluate her left arm pain however they never showed up so the facility just sent her over here for evaluation. She is complaining of diffuse neck pain that has been constant since the fall and worse with movement. Also complaining of severe left arm pain worse with minimal movement or palpation, constant, nonradiating since the fall. She is not on any blood thinners. She denies back pain, chest pain, abdominal pain.  Past Medical History:  Diagnosis Date  . CAD (coronary artery disease)   . Dementia   . 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 GERD (gastroesophageal reflux disease)   . Hip fracture (HCC)   . Hypertension   . Hypokalemia   . Schizophrenia (HCC)   . Thyroid disease    hypo    Patient Active Problem List   Diagnosis Date Noted  . Sepsis (HCC) 09/01/2016  . Closed left hip fracture (HCC) 02/29/2016  . 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 Essential hypertension 06/07/2015  . Hearing loss 06/07/2015  . Hypothyroidism 06/07/2015  . Cardiovascular disease 06/07/2015  . Atrial fibrillation (HCC) 06/07/2015  . GERD (gastroesophageal reflux disease) 06/07/2015  . IBS (irritable bowel syndrome) 06/07/2015  . Osteoarthritis 06/07/2015  . Paranoid schizophrenia (HCC)   . Schizophrenia (HCC) 06/06/2015  . Diabetes (HCC) 03/19/2015     Past Surgical History:  Procedure Laterality Date  . ABDOMINAL HYSTERECTOMY    . CESAREAN SECTION     x3  . HIP ARTHROPLASTY Left 03/01/2016   Procedure: ARTHROPLASTY BIPOLAR HIP (HEMIARTHROPLASTY);  Surgeon: Juanell Fairly, MD;  Location: [facility] ORS;  Service: Orthopedics;  Laterality: Left;    Prior to Admission medications   Medication Sig Start Date End Date Taking? Authorizing Provider  acetaminophen (TYLENOL) 500 MG tablet Take 500 mg by mouth 3 (three) times daily. Take at 8 am, 2 pm, and 6 pm. *Not to exceed 3 gm/24hr*   Yes [provider]  amLODipine (NORVASC) 10 MG tablet Take 10 mg by mouth daily.   Yes [provider]  aspirin 81 MG chewable tablet Chew 324 mg by mouth daily.   Yes [provider]  atorvastatin (LIPITOR) 40 MG tablet Take 1 tablet (40 mg total) by mouth daily at 6 PM. 06/11/15  Yes Jimmy Footman, MD  cyanocobalamin 1000 MCG tablet Take 1 tablet (1,000 mcg total) by mouth daily. 06/11/15  Yes Jimmy Footman, MD  esomeprazole (NEXIUM) 20 MG capsule Take 20 mg by mouth daily at 12 noon.   Yes [provider]  ferrous sulfate 325 (65 FE) MG tablet Take 1 tablet (325 mg total) by mouth 3 (three) times daily after meals. 03/03/16  Yes Shaune Pollack, MD  levothyroxine (SYNTHROID, LEVOTHROID) 50 MCG tablet Take 1 tablet (50 mcg total) by mouth daily before breakfast. 06/11/15  Yes Jimmy Footman, MD  LORazepam (ATIVAN) 0.5 MG tablet Take 1 tablet (0.5 mg total)  by mouth every 6 (six) hours as needed for anxiety. And /or agitation. Patient taking differently: Take 0.5 mg by mouth 2 (two) times daily. And /or agitation. 09/04/16  Yes Houston Siren, MD  memantine (NAMENDA) 10 MG tablet Take 10 mg by mouth 2 (two) times daily.   Yes [provider]  metFORMIN (GLUCOPHAGE) 500 MG tablet Take 1,000 mg by mouth 2 (two) times daily with a meal.   Yes [provider]  metoprolol (LOPRESSOR) 50  MG tablet Take 50 mg by mouth 2 (two) times daily.   Yes [provider]  sertraline (ZOLOFT) 25 MG tablet Take 25 mg by mouth daily.   Yes [provider]  bisacodyl (DULCOLAX) 5 MG EC tablet Take 1 tablet (5 mg total) by mouth daily as needed for moderate constipation. Patient not taking: Reported on 02/10/2017 03/03/16   Shaune Pollack, MD  docusate sodium (COLACE) 100 MG capsule Take 1 capsule (100 mg total) by mouth 2 (two) times daily. Patient not taking: Reported on 02/10/2017 03/03/16   Shaune Pollack, MD  HYDROcodone-acetaminophen (NORCO/VICODIN) 5-325 MG tablet Take 1 tablet by mouth every 4 (four) hours as needed for moderate pain. Patient not taking: Reported on 02/10/2017 09/04/16   Houston Siren, MD  insulin detemir (LEVEMIR) 100 UNIT/ML injection Inject 0.2 mLs (20 Units total) into the skin daily. Patient not taking: Reported on 02/10/2017 03/03/16   Shaune Pollack, MD  OLANZapine (ZYPREXA) 10 MG tablet Take 1 tablet (10 mg total) by mouth at bedtime. Patient not taking: Reported on 02/10/2017 06/04/15   Clapacs, Jackquline Denmark, MD  oxyCODONE (ROXICODONE) 5 MG immediate release tablet Take 1 tablet (5 mg total) by mouth every 6 (six) hours as needed for moderate pain or severe pain. 02/10/17 02/10/18  Nita Sickle, MD    Allergies Citalopram and Penicillins  Family History  Problem Relation Age of Onset  . Breast cancer Sister     Social History Social History  Substance Use Topics  . Smoking status: Never Smoker  . Smokeless tobacco: Never Used  . Alcohol use No    Review of Systems Constitutional: Negative for fever. Eyes: Negative for visual changes. ENT: Negative for facial injury. + neck pain Cardiovascular: Negative for chest injury. Respiratory: Negative for shortness of breath. Negative for chest wall injury. Gastrointestinal: Negative for abdominal pain or injury. Genitourinary: Negative for dysuria. Musculoskeletal: Negative for back injury. + L arm pain Skin:  Negative for laceration/abrasions. Neurological: Negative for head injury.   ____________________________________________   PHYSICAL EXAM:  VITAL SIGNS: ED Triage Vitals [02/10/17 1828]  Enc Vitals Group     BP (!) 145/62     Pulse Rate 91     Resp 16     Temp (!) 97.5 F (36.4 C)     Temp Source Oral     SpO2 99 %     Weight 124 lb (56.2 kg)     Height 5\' 3"  (1.6 m)     Head Circumference      Peak Flow      Pain Score 10     Pain Loc      Pain Edu?      Excl. in GC?    Constitutional: Alert and oriented x2. No acute distress. Does not appear intoxicated. HEENT Head: Normocephalic and atraumatic. Face: No facial bony tenderness. Stable midface Ears: No hemotympanum bilaterally. No Battle sign Eyes: No eye injury. PERRL. No raccoon eyes Nose: Nontender. No epistaxis. No rhinorrhea Mouth/Throat:  Mucous membranes are moist. No oropharyngeal blood. No dental injury. Airway patent without stridor. Normal voice. Neck: no C-collar in place. No midline c-spine tenderness. Diffuse bilateral paraspinal tenderness Cardiovascular: Normal rate, regular rhythm. Normal and symmetric distal pulses are present in all extremities. Pulmonary/Chest: Chest wall is stable and nontender to palpation/compression. Normal respiratory effort. Breath sounds are normal. No crepitus.  Abdominal: Soft, nontender, non distended. Musculoskeletal: large hematoma with obvious deformity and crepitus of the left humerus and elbow. Pain with internal and external rotation of the L hip. Nontender with normal full range of motion in all other extremities. No deformities. No thoracic or lumbar midline spinal tenderness. Pelvis is stable. Skin: Skin is warm, dry and intact. No abrasions or contutions. Psychiatric: Speech and behavior are appropriate. Neurological: Normal speech and language. Moves all extremities to command. No gross focal neurologic deficits are appreciated.  Glascow Coma Score: 4 - Opens eyes  on own 6 - Follows simple motor commands 5 - Alert and oriented GCS: 15   ____________________________________________   LABS (all labs ordered are listed, but only abnormal results are displayed)  Labs Reviewed - No data to display ____________________________________________  EKG  none  ____________________________________________  RADIOLOGY  CT head and cspine:negative  XR L humerus and elbow: Comminuted fracture of the left ulna olecranon process with large overlying hematoma. Comminuted fracture of the left humeral head with minimal displacement and at least 3 fragments  XR L hip: Unchanged appearance of left total hip arthroplasty without acute abnormality.  ____________________________________________   PROCEDURES  Procedure(s) performed: None Procedures Critical Care performed:  None ____________________________________________   INITIAL IMPRESSION / ASSESSMENT AND PLAN / ED COURSE  [age] y.o. female with a history of dementia, diabs, hypertension, schizophrenia who presents for evaluationof left arm pain status post mechanical fall. patient with a hematoma, obvious deformity and crepitus of her left humerus and elbow, has bilateral cervical paraspinal tenderness, no trauma to the head, no signs or symptoms of basilar skull fx. Pain with ROM of the L hip, patient with prior h/o  hip replacement. Will get head CT, CT cervical spine, XR of the elbow, arm and hip. Even though patient has a history of Alzheimer's she has good memory of the events leading to her fall and is a good historian therefore no suspicion for alternative etiology other than mechanical fall at this time.    _________________________ 8:31 PM on 02/10/2017 -----------------------------------------  Patient with a comminuted olecranon fracture and humeral head fracture. Discussed with Dr. Odis LusterBowers who is on call for [facility] who recommended a posterior slab splint and sling and he will see her  in clinic tomorrow. Patients can be discharged on Tylenol and oxycodone. Discussed recommendations and findings of x-ray with the nurse at the skilled nursing facility who will be taking care of the patient this evening. They will ensure close follow-up tomorrow. Patient's daughter has also been updated at the bedside.  Pertinent labs & imaging results that were available during my care of the patient were reviewed by me and considered in my medical decision making (see chart for details).    ____________________________________________   FINAL CLINICAL IMPRESSION(S) / ED DIAGNOSES  Final diagnoses:  Fall, initial encounter  Closed fracture of olecranon process of left ulna, initial encounter  Humeral head fracture, left, closed, initial encounter  Neck sprain, initial encounter      NEW MEDICATIONS STARTED DURING THIS VISIT:  New Prescriptions   OXYCODONE (ROXICODONE) 5 MG IMMEDIATE RELEASE TABLET  Take 1 tablet (5 mg total) by mouth every 6 (six) hours as needed for moderate pain or severe pain.     Note:  This document was prepared using Dragon voice recognition software and may include unintentional dictation errors.    Nita Sickle, MD 02/10/17 2035

## 2017-02-12 ENCOUNTER — Emergency Department
Admission: EM | Admit: 2017-02-12 | Discharge: 2017-02-12 | Disposition: A | Discharge status: Home / Self Care | Payer: Medicare Other | Attending: Emergency Medicine | Admitting: Emergency Medicine

## 2017-02-12 ENCOUNTER — Emergency Department: Payer: Medicare Other

## 2017-02-12 DIAGNOSIS — R4182 Altered mental status, unspecified: Secondary | ICD-10-CM | POA: Diagnosis present

## 2017-02-12 DIAGNOSIS — E039 Hypothyroidism, unspecified: Secondary | ICD-10-CM | POA: Diagnosis not present

## 2017-02-12 DIAGNOSIS — I259 Chronic ischemic heart disease, unspecified: Secondary | ICD-10-CM | POA: Insufficient documentation

## 2017-02-12 DIAGNOSIS — I10 Essential (primary) hypertension: Secondary | ICD-10-CM | POA: Insufficient documentation

## 2017-02-12 DIAGNOSIS — Z794 Long term (current) use of insulin: Secondary | ICD-10-CM | POA: Diagnosis not present

## 2017-02-12 DIAGNOSIS — G4751 Confusional arousals: Secondary | ICD-10-CM | POA: Diagnosis not present

## 2017-02-12 DIAGNOSIS — N390 Urinary tract infection, site not specified: Secondary | ICD-10-CM | POA: Insufficient documentation

## 2017-02-12 DIAGNOSIS — E1165 Type 2 diabetes mellitus with hyperglycemia: Secondary | ICD-10-CM | POA: Diagnosis not present

## 2017-02-12 DIAGNOSIS — Z79899 Other long term (current) drug therapy: Secondary | ICD-10-CM | POA: Insufficient documentation

## 2017-02-12 DIAGNOSIS — R739 Hyperglycemia, unspecified: Secondary | ICD-10-CM

## 2017-02-12 DIAGNOSIS — R410 Disorientation, unspecified: Secondary | ICD-10-CM

## 2017-02-12 LAB — COMPREHENSIVE METABOLIC PANEL
ALK PHOS: 60 U/L (ref 38–126)
ALT: 12 U/L — ABNORMAL LOW (ref 14–54)
ANION GAP: 8 (ref 5–15)
AST: 17 U/L (ref 15–41)
Albumin: 3.5 g/dL (ref 3.5–5.0)
BUN: 15 mg/dL (ref 6–20)
CALCIUM: 8.6 mg/dL — AB (ref 8.9–10.3)
CO2: 26 mmol/L (ref 22–32)
CREATININE: 0.61 mg/dL (ref 0.44–1.00)
Chloride: 108 mmol/L (ref 101–111)
GFR calc non Af Amer: >60 mL/min (ref >60)
GLUCOSE: 275 mg/dL — AB (ref 65–99)
POTASSIUM: 3.9 mmol/L (ref 3.5–5.1)
SODIUM: 142 mmol/L (ref 135–145)
Total Bilirubin: 0.7 mg/dL (ref 0.3–1.2)
Total Protein: 6.4 g/dL — ABNORMAL LOW (ref 6.5–8.1)

## 2017-02-12 LAB — URINALYSIS, COMPLETE (UACMP) WITH MICROSCOPIC
Bilirubin Urine: NEGATIVE
Glucose, UA: >=500 mg/dL — AB
Hgb urine dipstick: NEGATIVE
KETONES UR: NEGATIVE mg/dL
NITRITE: NEGATIVE
Protein, ur: 30 mg/dL — AB
SQUAMOUS EPITHELIAL / LPF: NONE SEEN
Specific Gravity, Urine: 1.025 (ref 1.005–1.030)
pH: 5 (ref 5.0–8.0)

## 2017-02-12 LAB — CBC
HCT: 29.1 % — ABNORMAL LOW (ref 35.0–47.0)
HEMOGLOBIN: 9.5 g/dL — AB (ref 12.0–16.0)
MCH: 32.3 pg (ref 26.0–34.0)
MCHC: 32.7 g/dL (ref 32.0–36.0)
MCV: 98.6 fL (ref 80.0–100.0)
Platelets: 227 10*3/uL (ref 150–440)
RBC: 2.96 MIL/uL — ABNORMAL LOW (ref 3.80–5.20)
RDW: 15.1 % — AB (ref 11.5–14.5)
WBC: 12 10*3/uL — ABNORMAL HIGH (ref 3.6–11.0)

## 2017-02-12 LAB — TROPONIN I

## 2017-02-12 LAB — GLUCOSE, CAPILLARY
GLUCOSE-CAPILLARY: 278 mg/dL — AB (ref 65–99)
GLUCOSE-CAPILLARY: 215 mg/dL — AB (ref 65–99)

## 2017-02-12 MED ORDER — DEXTROSE 5 % IV SOLN
1.0000 g | Freq: Once | INTRAVENOUS | Status: AC
  Administered 2017-02-12: 1 g via INTRAVENOUS
  Filled 2017-02-12: qty 10

## 2017-02-12 MED ORDER — SODIUM CHLORIDE 0.9 % IV SOLN
1000.0000 mL | Freq: Once | INTRAVENOUS | Status: AC
  Administered 2017-02-12: 1000 mL via INTRAVENOUS

## 2017-02-12 MED ORDER — CIPROFLOXACIN HCL 500 MG PO TABS
500.0000 mg | ORAL_TABLET | Freq: Two times a day (BID) | ORAL | 0 refills | Status: AC
Start: 2017-02-12 — End: ?

## 2017-02-12 NOTE — ED Provider Notes (Signed)
[facility] Emergency Department Provider Note   ____________________________________________    I have reviewed the triage vital signs and the nursing notes.   HISTORY  Chief Complaint Altered Mental Status and Hyperglycemia   Patient unable to give significant history  HPI Danielle Boyer is a 76 y.o. female Who presents with reported altered mental status. Nursing home staff felt that patient was "not herself today " but they were unable to elaborate on this. Patient has a history of dementia and schizophrenia as well as diabetes. Recently in the emergency department for a fall suffered a left elbow fracture was prescribed oxycodone and Tylenol. Patient unable to give significant history   Past Medical History:  Diagnosis Date  . CAD (coronary artery disease)   . Dementia   . 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 GERD (gastroesophageal reflux disease)   . Hip fracture (HCC)   . Hypertension   . Hypokalemia   . Schizophrenia (HCC)   . Thyroid disease    hypo    Patient Active Problem List   Diagnosis Date Noted  . Sepsis (HCC) 09/01/2016  . Closed left hip fracture (HCC) 02/29/2016  . 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 Essential hypertension 06/07/2015  . Hearing loss 06/07/2015  . Hypothyroidism 06/07/2015  . Cardiovascular disease 06/07/2015  . Atrial fibrillation (HCC) 06/07/2015  . GERD (gastroesophageal reflux disease) 06/07/2015  . IBS (irritable bowel syndrome) 06/07/2015  . Osteoarthritis 06/07/2015  . Paranoid schizophrenia (HCC)   . Schizophrenia (HCC) 06/06/2015  . Diabetes (HCC) 03/19/2015    Past Surgical History:  Procedure Laterality Date  . ABDOMINAL HYSTERECTOMY    . CESAREAN SECTION     x3  . HIP ARTHROPLASTY Left 03/01/2016   Procedure: ARTHROPLASTY BIPOLAR HIP (HEMIARTHROPLASTY);  Surgeon: Juanell Fairly, MD;  Location: [facility] ORS;  Service: Orthopedics;  Laterality: Left;    Prior  to Admission medications   Medication Sig Start Date End Date Taking? Authorizing Provider  acetaminophen (TYLENOL) 500 MG tablet Take 500 mg by mouth 3 (three) times daily. Take at 8 am, 2 pm, and 6 pm. *Not to exceed 3 gm/24hr*   Yes [provider]  amLODipine (NORVASC) 10 MG tablet Take 10 mg by mouth daily.   Yes [provider]  aspirin 81 MG chewable tablet Chew 324 mg by mouth daily.   Yes [provider]  atorvastatin (LIPITOR) 40 MG tablet Take 1 tablet (40 mg total) by mouth daily at 6 PM. 06/11/15  Yes Jimmy Footman, MD  cyanocobalamin 1000 MCG tablet Take 1 tablet (1,000 mcg total) by mouth daily. 06/11/15  Yes Jimmy Footman, MD  esomeprazole (NEXIUM) 20 MG capsule Take 20 mg by mouth daily at 12 noon.   Yes [provider]  ferrous sulfate 325 (65 FE) MG tablet Take 1 tablet (325 mg total) by mouth 3 (three) times daily after meals. 03/03/16  Yes Shaune Pollack, MD  levothyroxine (SYNTHROID, LEVOTHROID) 50 MCG tablet Take 1 tablet (50 mcg total) by mouth daily before breakfast. 06/11/15  Yes Jimmy Footman, MD  LORazepam (ATIVAN) 0.5 MG tablet Take 1 tablet (0.5 mg total) by mouth every 6 (six) hours as needed for anxiety. And /or agitation. Patient taking differently: Take 0.5 mg by mouth 2 (two) times daily. And /or agitation. 09/04/16  Yes Houston Siren, MD  memantine (NAMENDA) 10 MG tablet Take 10 mg by mouth 2 (two) times daily.   Yes [provider]  metFORMIN (GLUCOPHAGE) 500 MG tablet Take 1,000 mg by mouth 2 (two) times daily with a meal.   Yes [provider]  metoprolol (LOPRESSOR) 50 MG tablet Take 50 mg by mouth 2 (two) times daily.   Yes [provider]  oxyCODONE (ROXICODONE) 5 MG immediate release tablet Take 1 tablet (5 mg total) by mouth every 6 (six) hours as needed for moderate pain or severe pain. 02/10/17 02/10/18 Yes Veronese, [place], MD  sertraline (ZOLOFT) 25 MG tablet  Take 25 mg by mouth daily.   Yes [provider]  bisacodyl (DULCOLAX) 5 MG EC tablet Take 1 tablet (5 mg total) by mouth daily as needed for moderate constipation. Patient not taking: Reported on 02/10/2017 03/03/16   Shaune Pollack, MD  ciprofloxacin (CIPRO) 500 MG tablet Take 1 tablet (500 mg total) by mouth 2 (two) times daily. 02/12/17   Jene Every, MD  docusate sodium (COLACE) 100 MG capsule Take 1 capsule (100 mg total) by mouth 2 (two) times daily. Patient not taking: Reported on 02/10/2017 03/03/16   Shaune Pollack, MD  HYDROcodone-acetaminophen (NORCO/VICODIN) 5-325 MG tablet Take 1 tablet by mouth every 4 (four) hours as needed for moderate pain. Patient not taking: Reported on 02/10/2017 09/04/16   Houston Siren, MD  insulin detemir (LEVEMIR) 100 UNIT/ML injection Inject 0.2 mLs (20 Units total) into the skin daily. Patient not taking: Reported on 02/10/2017 03/03/16   Shaune Pollack, MD  OLANZapine (ZYPREXA) 10 MG tablet Take 1 tablet (10 mg total) by mouth at bedtime. Patient not taking: Reported on 02/10/2017 06/04/15   Clapacs, Jackquline Denmark, MD     Allergies Citalopram and Penicillins  Family History  Problem Relation Age of Onset  . Breast cancer Sister     Social History Social History  Substance Use Topics  . Smoking status: Never Smoker  . Smokeless tobacco: Never Used  . Alcohol use No    Level V caveat: Unable to obtain full Review of Systemsdue to dementia    ENT: denies neck pain Cardiovascular: no chest pain. Respiratory: Denies shortness of breath.no cough Gastrointestinal: No abdominal pain.  No nausea, no vomiting.   Genitourinary: denies dysuria Musculoskeletal: left arm pain  Neurological: no weakness   ____________________________________________   PHYSICAL EXAM:  VITAL SIGNS: ED Triage Vitals  Enc Vitals Group     BP 02/12/17 0615 (!) 141/56     Pulse Rate 02/12/17 0615 83     Resp 02/12/17 0615 16     Temp 02/12/17 0615 99.4 F (37.4 C)     Temp  Source 02/12/17 0615 Oral     SpO2 02/12/17 0615 96 %     Weight 02/12/17 0617 61.2 kg (135 lb)     Height 02/12/17 0617 1.676 m (5\' 6" )     Head Circumference --      Peak Flow --      Pain Score --      Pain Loc --      Pain Edu? --      Excl. in GC? --     Constitutional: Alert. No acute distress.  Eyes: Conjunctivae are normal.  Head: Atraumatic. Nose: No congestion/rhinnorhea. Mouth/Throat: Mucous membranes are moist.   Neck:  Painless ROM, no vertebral tenderness to palpation Cardiovascular: Normal rate, regular rhythm. Grossly normal heart sounds.  Good peripheral circulation. Respiratory: Normal respiratory effort.  No retractions. Lungs CTAB. Gastrointestinal: Soft and nontender. No distention.  Genitourinary: deferred Musculoskeletal: left arm in sling/posterior splint, 2+  distal pulses, bruising along the upper arm and elbow but compartments soft Neurologic:  Normal speech and language. No gross focal neurologic deficits are appreciated. Moves all extremities equally Skin:  Skin is warm, dry and intact. No rash noted. Psychiatric: Mood and affect are normal. Speech and behavior are normal.  ____________________________________________   LABS (all labs ordered are listed, but only abnormal results are displayed)  Labs Reviewed  CBC - Abnormal; Notable for the following:       Result Value   WBC 12.0 (*)    RBC 2.96 (*)    Hemoglobin 9.5 (*)    HCT 29.1 (*)    RDW 15.1 (*)    All other components within normal limits  COMPREHENSIVE METABOLIC PANEL - Abnormal; Notable for the following:    Glucose, Bld 275 (*)    Calcium 8.6 (*)    Total Protein 6.4 (*)    ALT 12 (*)    All other components within normal limits  URINALYSIS, COMPLETE (UACMP) WITH MICROSCOPIC - Abnormal; Notable for the following:    Color, Urine AMBER (*)    APPearance CLOUDY (*)    Glucose, UA >=500 (*)    Protein, ur 30 (*)    Leukocytes, UA MODERATE (*)    Bacteria, UA MANY (*)    All  other components within normal limits  GLUCOSE, CAPILLARY - Abnormal; Notable for the following:    Glucose-Capillary 278 (*)    All other components within normal limits  GLUCOSE, CAPILLARY - Abnormal; Notable for the following:    Glucose-Capillary 215 (*)    All other components within normal limits  URINE CULTURE  TROPONIN I  CBG MONITORING, ED   ____________________________________________  EKG  ED ECG REPORT I, Jene EveryKINNER, Darlen Gledhill, the attending physician, personally viewed and interpreted this ECG.  Date: 02/12/2017  Rate:84 Rhythm: normal sinus rhythm QRS Axis: normal Intervals: normal ST/T Wave abnormalities: normal Narrative Interpretation: unremarkable  ____________________________________________  RADIOLOGY  CT head unremarkable ____________________________________________   PROCEDURES  Procedure(s) performed: No    Critical Care performed:No ____________________________________________   INITIAL IMPRESSION / ASSESSMENT AND PLAN / ED COURSE  Pertinent labs & imaging results that were available during my care of the patient were reviewed by me and considered in my medical decision making (see chart for details).  Patient presents with questionable altered mental status.based on prior notes she appears to be at her baseline we will check labs urinalysis CT to further evaluate.  Patient's lab work is overall reassuring however she does have a significant UTI likely the cause of her confusion. This was treated with IV Rocephin. Discussed with daughter who feels the patient is close to baseline and appropriate for discharge back to nursing home on antibiotics.    ____________________________________________   FINAL CLINICAL IMPRESSION(S) / ED DIAGNOSES  Final diagnoses:  Lower urinary tract infectious disease  Confusion  Hyperglycemia      NEW MEDICATIONS STARTED DURING THIS VISIT:  Discharge Medication List as of 02/12/2017 10:42 AM    START  taking these medications   Details  ciprofloxacin (CIPRO) 500 MG tablet Take 1 tablet (500 mg total) by mouth 2 (two) times daily., Starting Fri 02/12/2017, Print         Note:  This document was prepared using Dragon voice recognition software and may include unintentional dictation errors.    Jene EveryKinner, Marijayne Rauth, MD 02/12/17 [phone]

## 2017-02-12 NOTE — ED Notes (Signed)
[facility] and [facility]. Daughter at bedside currently. Reviewed discharge paperwork with daughter.

## 2017-02-12 NOTE — ED Triage Notes (Signed)
Per EMS, pt from [facility] and reports "pt is not acting normal/her usual." EMS reports nursing home did not state what pt's "not normal" is from baseline. EMS also reports pt's blood sugar was 365 at facility and per facility Dr "do not administer insulin over 350." Pt with hx of dementia, pt is able to answer questions at this time. Pt with recent inj to left shoulder/arm and has immobilizer on at this time.

## 2017-02-12 NOTE — ED Notes (Signed)
Patient transported to CT 

## 2017-02-12 NOTE — ED Notes (Signed)
Upon collecting urine sample, pt has redness to lower left crease of abd, pt states no pain to area.

## 2017-02-12 NOTE — ED Notes (Addendum)
Per Kennith Centerracey at facility, states pt was more groggy than normal this am, pt would talk to staff then fall back asleep which is different than normal mornings. Staff also states pt is ambulatory however this am pt was more fatigued than normal. Kennith Centerracey also states pt's daughter is on the way to this ED.  Facility #: [phone]

## 2017-02-12 NOTE — ED Notes (Signed)
Pt has tennis shoes on, yellow arm band and bed alarm placed on pt.

## 2017-02-15 LAB — URINE CULTURE

## 2017-02-16 ENCOUNTER — Other Ambulatory Visit: Payer: Self-pay | Admitting: Specialist

## 2017-02-16 ENCOUNTER — Encounter
Admission: RE | Admit: 2017-02-16 | Discharge: 2017-02-16 | Disposition: A | Discharge status: Home / Self Care | Payer: Medicare Other | Source: Ambulatory Visit | Attending: Specialist | Admitting: Specialist

## 2017-02-16 DIAGNOSIS — K219 Gastro-esophageal reflux disease without esophagitis: Secondary | ICD-10-CM | POA: Diagnosis not present

## 2017-02-16 DIAGNOSIS — E876 Hypokalemia: Secondary | ICD-10-CM | POA: Diagnosis not present

## 2017-02-16 DIAGNOSIS — Z90710 Acquired absence of both cervix and uterus: Secondary | ICD-10-CM | POA: Diagnosis not present

## 2017-02-16 DIAGNOSIS — H9190 Unspecified hearing loss, unspecified ear: Secondary | ICD-10-CM

## 2017-02-16 DIAGNOSIS — Z803 Family history of malignant neoplasm of breast: Secondary | ICD-10-CM | POA: Diagnosis not present

## 2017-02-16 DIAGNOSIS — F209 Schizophrenia, unspecified: Secondary | ICD-10-CM

## 2017-02-16 DIAGNOSIS — G3184 Mild cognitive impairment, so stated: Secondary | ICD-10-CM | POA: Insufficient documentation

## 2017-02-16 DIAGNOSIS — M1990 Unspecified osteoarthritis, unspecified site: Secondary | ICD-10-CM

## 2017-02-16 DIAGNOSIS — I2510 Atherosclerotic heart disease of native coronary artery without angina pectoris: Secondary | ICD-10-CM | POA: Insufficient documentation

## 2017-02-16 DIAGNOSIS — E119 Type 2 diabetes mellitus without complications: Secondary | ICD-10-CM

## 2017-02-16 DIAGNOSIS — K589 Irritable bowel syndrome without diarrhea: Secondary | ICD-10-CM

## 2017-02-16 DIAGNOSIS — Z96642 Presence of left artificial hip joint: Secondary | ICD-10-CM | POA: Diagnosis not present

## 2017-02-16 DIAGNOSIS — A419 Sepsis, unspecified organism: Secondary | ICD-10-CM | POA: Insufficient documentation

## 2017-02-16 DIAGNOSIS — Z880 Allergy status to penicillin: Secondary | ICD-10-CM | POA: Diagnosis not present

## 2017-02-16 DIAGNOSIS — E039 Hypothyroidism, unspecified: Secondary | ICD-10-CM | POA: Insufficient documentation

## 2017-02-16 DIAGNOSIS — Z794 Long term (current) use of insulin: Secondary | ICD-10-CM | POA: Diagnosis not present

## 2017-02-16 DIAGNOSIS — X58XXXA Exposure to other specified factors, initial encounter: Secondary | ICD-10-CM | POA: Diagnosis not present

## 2017-02-16 DIAGNOSIS — S52032A Displaced fracture of olecranon process with intraarticular extension of left ulna, initial encounter for closed fracture: Secondary | ICD-10-CM | POA: Diagnosis present

## 2017-02-16 DIAGNOSIS — F0280 Dementia in other diseases classified elsewhere without behavioral disturbance: Secondary | ICD-10-CM | POA: Diagnosis not present

## 2017-02-16 DIAGNOSIS — Z01812 Encounter for preprocedural laboratory examination: Secondary | ICD-10-CM

## 2017-02-16 DIAGNOSIS — I10 Essential (primary) hypertension: Secondary | ICD-10-CM

## 2017-02-16 DIAGNOSIS — Z7982 Long term (current) use of aspirin: Secondary | ICD-10-CM | POA: Diagnosis not present

## 2017-02-16 DIAGNOSIS — Z79891 Long term (current) use of opiate analgesic: Secondary | ICD-10-CM | POA: Diagnosis not present

## 2017-02-16 DIAGNOSIS — G309 Alzheimer's disease, unspecified: Secondary | ICD-10-CM | POA: Diagnosis not present

## 2017-02-16 DIAGNOSIS — E785 Hyperlipidemia, unspecified: Secondary | ICD-10-CM | POA: Diagnosis not present

## 2017-02-16 DIAGNOSIS — Z79899 Other long term (current) drug therapy: Secondary | ICD-10-CM | POA: Diagnosis not present

## 2017-02-16 DIAGNOSIS — Z888 Allergy status to other drugs, medicaments and biological substances status: Secondary | ICD-10-CM | POA: Diagnosis not present

## 2017-02-16 DIAGNOSIS — E1165 Type 2 diabetes mellitus with hyperglycemia: Secondary | ICD-10-CM | POA: Diagnosis not present

## 2017-02-16 DIAGNOSIS — I4891 Unspecified atrial fibrillation: Secondary | ICD-10-CM | POA: Diagnosis not present

## 2017-02-16 HISTORY — DX: Dementia in other diseases classified elsewhere, unspecified severity, without behavioral disturbance, psychotic disturbance, mood disturbance, and anxiety: F02.80

## 2017-02-16 HISTORY — DX: Unspecified atrial fibrillation: I48.91

## 2017-02-16 HISTORY — DX: Hypothyroidism, unspecified: E03.9

## 2017-02-16 HISTORY — DX: Alzheimer's disease, unspecified: G30.9

## 2017-02-16 MED ORDER — CLINDAMYCIN PHOSPHATE 600 MG/50ML IV SOLN
600.0000 mg | Freq: Once | INTRAVENOUS | Status: AC
  Administered 2017-02-17: 600 mg via INTRAVENOUS

## 2017-02-16 NOTE — Progress Notes (Signed)
Pharmacy Note - ED Culture Report Follow up  UCx from 02/12/17 with >100k E Coli resistant to cipro, sensitive to cefazolin. Pt with PCN allergy (rash). Pt received a dose of ceftriaxone 1 g x1 during ED visit. Pt with hx of having received ceftriaxone and keflex multiple times in the past.  Called pt's number in the chart ([phone]) with no answer, kept ringing. Called [facility] at number in the chart [phone]) and spoke with Angelique BlonderDenise who stated pt is a resident of the facility and [facility] [phone]). [facility], spoke with Tenny Crawoss, Pharmacist, and relayed information to d/c cipro and start Keflex 500 mg PO BID x7 days. Faxed Discharge Culture Review sheet with [facility] signature [facility] as requested (fax number [phone]). Fax transmission confirmed via printout.

## 2017-02-16 NOTE — Patient Instructions (Signed)
Your procedure is scheduled on: February 17, 2017 [facility](WEDNESDAY) Report to Same Day Surgery 2nd floor medical mall [facility](Medical Mall Entrance-take elevator on left to 2nd floor.  Check in with surgery information desk.) To find out your arrival time please call [phone] between 1PM - 3PM on February 16, 2017 (TUESDAY)  Remember: Instructions that are not followed completely may result in serious medical risk, up to and including death, or upon the discretion of your surgeon and anesthesiologist your surgery may need to be rescheduled.    _x___ 1. Do not eat food or drink liquids after midnight. No gum chewing or hard candies                               __x__ 2. No Alcohol for 24 hours before or after surgery.   __x__3. No Smoking for 24 prior to surgery.   ____  4. Bring all medications with you on the day of surgery if instructed.    __x__ 5. Notify your doctor if there is any change in your medical condition     (cold, fever, infections).     Do not wear jewelry, make-up, hairpins, clips or nail polish.  Do not wear lotions, powders, or perfumes.   Do not shave 48 hours prior to surgery. Men may shave face and neck.  Do not bring valuables to the hospital.    [facility] responsible [facility] any belongings or valuables.               Contacts, dentures or bridgework may not be worn into surgery.  Leave your suitcase in the car. After surgery it may be brought to your room.  For patients admitted to the hospital, discharge time is determined by your  treatment team                       Patients discharged the day of surgery will not be allowed to drive home.  You will need someone to drive you home and stay with you the night of your procedure.    Please read over the following fact sheets that you were given:   [facility] Preparing for Surgery and or MRSA Information   TAKE THE FOLLOWING MEDICATIONS WITH A SIP OF WATER THE MORNING OF SURGERY :  1. AMLODIPINE  2. METOPROLOL  3.  LEVOTHYROXINE  4. ZOLOFT  5.NEXIUM (NEXIUM AT BEDTIME TONIGHT )  6.  ____Fleets enema or Magnesium Citrate as directed.   _x___ Use CHG Soap or sage wipes as directed on instruction sheet   ____ Use inhalers on the day of surgery and bring to hospital day of surgery  _X___ Stop Metformin 2 days prior to surgery. (DO NOT GIVE METFORMIN TONIGHT )    _x___ Take 1/2 of usual insulin dose the night before surgery and none on the morning  Surgery---(NO INSULIN TOMORROW MORNING )   _x___ Follow recommendations from Cardiologist, Pulmonologist or PCP regarding          stopping Aspirin, Coumadin, Plavix ,Eliquis, Effient, or Pradaxa, and Pletal. (ASPIRIN ON HOLD FOR SURGERY )  X____Stop Anti-inflammatories such as Advil, Aleve, Ibuprofen, Motrin, Naproxen, Naprosyn, Goodies powders or aspirin products. OK to take Tylenol    _x___ Stop supplements until after surgery.  But may continue Vitamin D, Vitamin B,       and multivitamin. (HOLD MELATONIN TONIGHT )   ____ Bring C-Pap  to the hospital.

## 2017-02-17 ENCOUNTER — Observation Stay
Admission: RE | Admit: 2017-02-17 | Discharge: 2017-02-18 | Disposition: A | Discharge status: Skilled Nursing Facility | Payer: Medicare Other | Source: Ambulatory Visit | Attending: Specialist | Admitting: Specialist

## 2017-02-17 ENCOUNTER — Encounter
Admission: RE | Disposition: A | Discharge status: Skilled Nursing Facility | Payer: Self-pay | Source: Ambulatory Visit | Attending: Specialist

## 2017-02-17 ENCOUNTER — Ambulatory Visit: Payer: Medicare Other | Admitting: Certified Registered Nurse Anesthetist

## 2017-02-17 DIAGNOSIS — I4891 Unspecified atrial fibrillation: Secondary | ICD-10-CM | POA: Insufficient documentation

## 2017-02-17 DIAGNOSIS — K219 Gastro-esophageal reflux disease without esophagitis: Secondary | ICD-10-CM | POA: Insufficient documentation

## 2017-02-17 DIAGNOSIS — Z7982 Long term (current) use of aspirin: Secondary | ICD-10-CM | POA: Insufficient documentation

## 2017-02-17 DIAGNOSIS — Z880 Allergy status to penicillin: Secondary | ICD-10-CM | POA: Insufficient documentation

## 2017-02-17 DIAGNOSIS — G309 Alzheimer's disease, unspecified: Secondary | ICD-10-CM | POA: Insufficient documentation

## 2017-02-17 DIAGNOSIS — Z803 Family history of malignant neoplasm of breast: Secondary | ICD-10-CM | POA: Insufficient documentation

## 2017-02-17 DIAGNOSIS — E785 Hyperlipidemia, unspecified: Secondary | ICD-10-CM | POA: Insufficient documentation

## 2017-02-17 DIAGNOSIS — X58XXXA Exposure to other specified factors, initial encounter: Secondary | ICD-10-CM | POA: Insufficient documentation

## 2017-02-17 DIAGNOSIS — F209 Schizophrenia, unspecified: Secondary | ICD-10-CM | POA: Insufficient documentation

## 2017-02-17 DIAGNOSIS — I10 Essential (primary) hypertension: Secondary | ICD-10-CM | POA: Insufficient documentation

## 2017-02-17 DIAGNOSIS — Z794 Long term (current) use of insulin: Secondary | ICD-10-CM | POA: Insufficient documentation

## 2017-02-17 DIAGNOSIS — Z79899 Other long term (current) drug therapy: Secondary | ICD-10-CM | POA: Insufficient documentation

## 2017-02-17 DIAGNOSIS — Z79891 Long term (current) use of opiate analgesic: Secondary | ICD-10-CM | POA: Insufficient documentation

## 2017-02-17 DIAGNOSIS — Z888 Allergy status to other drugs, medicaments and biological substances status: Secondary | ICD-10-CM | POA: Insufficient documentation

## 2017-02-17 DIAGNOSIS — E876 Hypokalemia: Secondary | ICD-10-CM | POA: Insufficient documentation

## 2017-02-17 DIAGNOSIS — S52022A Displaced fracture of olecranon process without intraarticular extension of left ulna, initial encounter for closed fracture: Secondary | ICD-10-CM | POA: Diagnosis present

## 2017-02-17 DIAGNOSIS — E039 Hypothyroidism, unspecified: Secondary | ICD-10-CM | POA: Insufficient documentation

## 2017-02-17 DIAGNOSIS — S52032A Displaced fracture of olecranon process with intraarticular extension of left ulna, initial encounter for closed fracture: Principal | ICD-10-CM | POA: Insufficient documentation

## 2017-02-17 DIAGNOSIS — Z90710 Acquired absence of both cervix and uterus: Secondary | ICD-10-CM | POA: Insufficient documentation

## 2017-02-17 DIAGNOSIS — E1165 Type 2 diabetes mellitus with hyperglycemia: Secondary | ICD-10-CM | POA: Insufficient documentation

## 2017-02-17 DIAGNOSIS — I2510 Atherosclerotic heart disease of native coronary artery without angina pectoris: Secondary | ICD-10-CM | POA: Insufficient documentation

## 2017-02-17 DIAGNOSIS — Z96642 Presence of left artificial hip joint: Secondary | ICD-10-CM | POA: Insufficient documentation

## 2017-02-17 DIAGNOSIS — F0280 Dementia in other diseases classified elsewhere without behavioral disturbance: Secondary | ICD-10-CM | POA: Insufficient documentation

## 2017-02-17 HISTORY — PX: ORIF ELBOW FRACTURE: SHX5031

## 2017-02-17 LAB — URINALYSIS, COMPLETE (UACMP) WITH MICROSCOPIC
BACTERIA UA: NONE SEEN
BILIRUBIN URINE: NEGATIVE
HGB URINE DIPSTICK: NEGATIVE
KETONES UR: 5 mg/dL — AB
Leukocytes, UA: NEGATIVE
NITRITE: NEGATIVE
PROTEIN: NEGATIVE mg/dL
RBC / HPF: NONE SEEN RBC/hpf (ref 0–5)
Specific Gravity, Urine: 1.017 (ref 1.005–1.030)
Squamous Epithelial / LPF: NONE SEEN
pH: 5 (ref 5.0–8.0)

## 2017-02-17 LAB — GLUCOSE, CAPILLARY
GLUCOSE-CAPILLARY: 233 mg/dL — AB (ref 65–99)
Glucose-Capillary: 274 mg/dL — ABNORMAL HIGH (ref 65–99)
Glucose-Capillary: 231 mg/dL — ABNORMAL HIGH (ref 65–99)
Glucose-Capillary: 304 mg/dL — ABNORMAL HIGH (ref 65–99)

## 2017-02-17 SURGERY — OPEN REDUCTION INTERNAL FIXATION (ORIF) ELBOW/OLECRANON FRACTURE
Anesthesia: General | Site: Elbow | Laterality: Left | Wound class: Clean

## 2017-02-17 MED ORDER — FENTANYL CITRATE (PF) 100 MCG/2ML IJ SOLN
INTRAMUSCULAR | Status: AC
  Filled 2017-02-17: qty 2

## 2017-02-17 MED ORDER — BISACODYL 5 MG PO TBEC: 5.0000 mg | DELAYED_RELEASE_TABLET | Freq: Every day | ORAL | Status: DC | PRN

## 2017-02-17 MED ORDER — HYDROCODONE-ACETAMINOPHEN 5-325 MG PO TABS: 1.0000 | ORAL_TABLET | ORAL | Status: DC | PRN

## 2017-02-17 MED ORDER — SODIUM CHLORIDE 0.9 % IV SOLN
INTRAVENOUS | Status: DC | PRN
  Administered 2017-02-17: 11:00:00 via INTRAVENOUS

## 2017-02-17 MED ORDER — PROPOFOL 10 MG/ML IV BOLUS
INTRAVENOUS | Status: DC | PRN
  Administered 2017-02-17: 70 mg via INTRAVENOUS

## 2017-02-17 MED ORDER — CEFAZOLIN SODIUM-DEXTROSE 2-4 GM/100ML-% IV SOLN
INTRAVENOUS | Status: AC
  Filled 2017-02-17: qty 100

## 2017-02-17 MED ORDER — METHOCARBAMOL 1000 MG/10ML IJ SOLN
500.0000 mg | Freq: Four times a day (QID) | INTRAVENOUS | Status: DC | PRN
  Filled 2017-02-17: qty 5

## 2017-02-17 MED ORDER — ONDANSETRON HCL 4 MG/2ML IJ SOLN
INTRAMUSCULAR | Status: DC | PRN
  Administered 2017-02-17: 4 mg via INTRAVENOUS

## 2017-02-17 MED ORDER — DEXAMETHASONE SODIUM PHOSPHATE 10 MG/ML IJ SOLN
INTRAMUSCULAR | Status: AC
  Filled 2017-02-17: qty 1

## 2017-02-17 MED ORDER — PROPOFOL 10 MG/ML IV BOLUS
INTRAVENOUS | Status: AC
  Filled 2017-02-17: qty 20

## 2017-02-17 MED ORDER — BISACODYL 10 MG RE SUPP: 10.0000 mg | Freq: Every day | RECTAL | Status: DC | PRN

## 2017-02-17 MED ORDER — MORPHINE SULFATE (PF) 2 MG/ML IV SOLN: 1.0000 mg | INTRAVENOUS | Status: DC | PRN

## 2017-02-17 MED ORDER — MEMANTINE HCL 5 MG PO TABS
10.0000 mg | ORAL_TABLET | Freq: Two times a day (BID) | ORAL | Status: DC
  Administered 2017-02-17 – 2017-02-18 (×2): 10 mg via ORAL
  Filled 2017-02-17 (×2): qty 2

## 2017-02-17 MED ORDER — METOPROLOL TARTRATE 50 MG PO TABS
50.0000 mg | ORAL_TABLET | Freq: Two times a day (BID) | ORAL | Status: DC
  Administered 2017-02-17 – 2017-02-18 (×2): 50 mg via ORAL
  Filled 2017-02-17 (×2): qty 1

## 2017-02-17 MED ORDER — SUGAMMADEX SODIUM 200 MG/2ML IV SOLN
INTRAVENOUS | Status: DC | PRN
  Administered 2017-02-17: 130 mg via INTRAVENOUS

## 2017-02-17 MED ORDER — INSULIN ASPART 100 UNIT/ML ~~LOC~~ SOLN
0.0000 [IU] | Freq: Three times a day (TID) | SUBCUTANEOUS | Status: DC
  Administered 2017-02-17: 3 [IU] via SUBCUTANEOUS
  Administered 2017-02-18: 7 [IU] via SUBCUTANEOUS
  Administered 2017-02-18: 5 [IU] via SUBCUTANEOUS
  Filled 2017-02-17 (×3): qty 1

## 2017-02-17 MED ORDER — ROCURONIUM BROMIDE 50 MG/5ML IV SOLN
INTRAVENOUS | Status: AC
  Filled 2017-02-17: qty 1

## 2017-02-17 MED ORDER — CIPROFLOXACIN HCL 500 MG PO TABS: 500.0000 mg | ORAL_TABLET | Freq: Two times a day (BID) | ORAL | Status: DC

## 2017-02-17 MED ORDER — CEPHALEXIN 500 MG PO CAPS
500.0000 mg | ORAL_CAPSULE | Freq: Two times a day (BID) | ORAL | Status: DC
  Filled 2017-02-17: qty 1

## 2017-02-17 MED ORDER — LEVOTHYROXINE SODIUM 50 MCG PO TABS
50.0000 ug | ORAL_TABLET | Freq: Every day | ORAL | Status: DC
  Administered 2017-02-18: 50 ug via ORAL
  Filled 2017-02-17: qty 1

## 2017-02-17 MED ORDER — LORAZEPAM 0.5 MG PO TABS
0.5000 mg | ORAL_TABLET | Freq: Two times a day (BID) | ORAL | Status: DC
  Administered 2017-02-17 – 2017-02-18 (×2): 0.5 mg via ORAL
  Filled 2017-02-17 (×2): qty 1

## 2017-02-17 MED ORDER — SERTRALINE HCL 50 MG PO TABS
25.0000 mg | ORAL_TABLET | Freq: Every day | ORAL | Status: DC
  Administered 2017-02-18: 25 mg via ORAL
  Filled 2017-02-17: qty 1

## 2017-02-17 MED ORDER — ACETAMINOPHEN 650 MG RE SUPP: 650.0000 mg | Freq: Four times a day (QID) | RECTAL | Status: DC | PRN

## 2017-02-17 MED ORDER — CELECOXIB 200 MG PO CAPS
200.0000 mg | ORAL_CAPSULE | Freq: Two times a day (BID) | ORAL | Status: DC
  Administered 2017-02-17 – 2017-02-18 (×2): 200 mg via ORAL
  Filled 2017-02-17 (×2): qty 1

## 2017-02-17 MED ORDER — CEFAZOLIN SODIUM-DEXTROSE 2-3 GM-% IV SOLR
INTRAVENOUS | Status: DC | PRN
  Administered 2017-02-17: 2 g via INTRAVENOUS

## 2017-02-17 MED ORDER — CEFAZOLIN SODIUM-DEXTROSE 2-4 GM/100ML-% IV SOLN: 2.0000 g | INTRAVENOUS | Status: DC

## 2017-02-17 MED ORDER — FENTANYL CITRATE (PF) 100 MCG/2ML IJ SOLN
INTRAMUSCULAR | Status: DC | PRN
  Administered 2017-02-17: 50 ug via INTRAVENOUS
  Administered 2017-02-17 (×2): 25 ug via INTRAVENOUS
  Administered 2017-02-17 (×2): 50 ug via INTRAVENOUS

## 2017-02-17 MED ORDER — VITAMIN B-12 1000 MCG PO TABS
1000.0000 ug | ORAL_TABLET | Freq: Every day | ORAL | Status: DC
  Administered 2017-02-18: 1000 ug via ORAL
  Filled 2017-02-17: qty 1

## 2017-02-17 MED ORDER — ONDANSETRON HCL 4 MG/2ML IJ SOLN
INTRAMUSCULAR | Status: AC
  Filled 2017-02-17: qty 2

## 2017-02-17 MED ORDER — TRAZODONE HCL 100 MG PO TABS
100.0000 mg | ORAL_TABLET | Freq: Every day | ORAL | Status: DC
  Administered 2017-02-17: 100 mg via ORAL
  Filled 2017-02-17: qty 1

## 2017-02-17 MED ORDER — SUCCINYLCHOLINE CHLORIDE 20 MG/ML IJ SOLN
INTRAMUSCULAR | Status: DC | PRN
  Administered 2017-02-17: 80 mg via INTRAVENOUS

## 2017-02-17 MED ORDER — ASPIRIN 81 MG PO CHEW
324.0000 mg | CHEWABLE_TABLET | Freq: Every day | ORAL | Status: DC
  Administered 2017-02-18: 324 mg via ORAL
  Filled 2017-02-17: qty 4

## 2017-02-17 MED ORDER — SODIUM CHLORIDE 0.9 % IV SOLN: INTRAVENOUS | Status: DC

## 2017-02-17 MED ORDER — HYDROCODONE-ACETAMINOPHEN 7.5-325 MG PO TABS: 1.0000 | ORAL_TABLET | ORAL | Status: DC | PRN

## 2017-02-17 MED ORDER — METHOCARBAMOL 500 MG PO TABS
500.0000 mg | ORAL_TABLET | Freq: Four times a day (QID) | ORAL | Status: DC | PRN
  Administered 2017-02-17: 500 mg via ORAL
  Filled 2017-02-17: qty 1

## 2017-02-17 MED ORDER — SUGAMMADEX SODIUM 200 MG/2ML IV SOLN
INTRAVENOUS | Status: AC
  Filled 2017-02-17: qty 2

## 2017-02-17 MED ORDER — ONDANSETRON HCL 4 MG PO TABS: 4.0000 mg | ORAL_TABLET | Freq: Four times a day (QID) | ORAL | Status: DC | PRN

## 2017-02-17 MED ORDER — CEFAZOLIN SODIUM-DEXTROSE 2-4 GM/100ML-% IV SOLN
2.0000 g | Freq: Three times a day (TID) | INTRAVENOUS | Status: AC
  Administered 2017-02-17 – 2017-02-18 (×3): 2 g via INTRAVENOUS
  Filled 2017-02-17 (×4): qty 100

## 2017-02-17 MED ORDER — INSULIN DETEMIR 100 UNIT/ML ~~LOC~~ SOLN
20.0000 [IU] | Freq: Every day | SUBCUTANEOUS | Status: DC
  Administered 2017-02-18: 20 [IU] via SUBCUTANEOUS
  Filled 2017-02-17 (×3): qty 0.2

## 2017-02-17 MED ORDER — CHLORHEXIDINE GLUCONATE CLOTH 2 % EX PADS: 6.0000 | MEDICATED_PAD | Freq: Once | CUTANEOUS | Status: DC

## 2017-02-17 MED ORDER — BUPIVACAINE HCL (PF) 0.5 % IJ SOLN
INTRAMUSCULAR | Status: AC
  Filled 2017-02-17: qty 30

## 2017-02-17 MED ORDER — AMLODIPINE BESYLATE 10 MG PO TABS
10.0000 mg | ORAL_TABLET | Freq: Every day | ORAL | Status: DC
  Administered 2017-02-18: 10 mg via ORAL
  Filled 2017-02-17: qty 1

## 2017-02-17 MED ORDER — ACETAMINOPHEN 325 MG PO TABS: 650.0000 mg | ORAL_TABLET | Freq: Four times a day (QID) | ORAL | Status: DC | PRN

## 2017-02-17 MED ORDER — DOCUSATE SODIUM 100 MG PO CAPS: 100.0000 mg | ORAL_CAPSULE | Freq: Two times a day (BID) | ORAL | Status: DC | PRN

## 2017-02-17 MED ORDER — FLEET ENEMA 7-19 GM/118ML RE ENEM: 1.0000 | ENEMA | Freq: Once | RECTAL | Status: DC | PRN

## 2017-02-17 MED ORDER — ACETAMINOPHEN 500 MG PO TABS
500.0000 mg | ORAL_TABLET | Freq: Three times a day (TID) | ORAL | Status: DC
  Administered 2017-02-17 – 2017-02-18 (×3): 500 mg via ORAL
  Filled 2017-02-17 (×3): qty 1

## 2017-02-17 MED ORDER — LIDOCAINE HCL (PF) 2 % IJ SOLN
INTRAMUSCULAR | Status: AC
  Filled 2017-02-17: qty 2

## 2017-02-17 MED ORDER — NEOMYCIN-POLYMYXIN B GU 40-200000 IR SOLN
Status: DC | PRN
  Administered 2017-02-17: 4 mL

## 2017-02-17 MED ORDER — NEOMYCIN-POLYMYXIN B GU 40-200000 IR SOLN
Status: AC
  Filled 2017-02-17: qty 4

## 2017-02-17 MED ORDER — SENNA 8.6 MG PO TABS
1.0000 | ORAL_TABLET | Freq: Two times a day (BID) | ORAL | Status: DC
  Administered 2017-02-17 – 2017-02-18 (×2): 8.6 mg via ORAL
  Filled 2017-02-17 (×2): qty 1

## 2017-02-17 MED ORDER — FENTANYL CITRATE (PF) 100 MCG/2ML IJ SOLN: 25.0000 ug | INTRAMUSCULAR | Status: DC | PRN

## 2017-02-17 MED ORDER — CLINDAMYCIN PHOSPHATE 600 MG/50ML IV SOLN
600.0000 mg | Freq: Three times a day (TID) | INTRAVENOUS | Status: AC
  Administered 2017-02-17 – 2017-02-18 (×3): 600 mg via INTRAVENOUS
  Filled 2017-02-17 (×3): qty 50

## 2017-02-17 MED ORDER — METFORMIN HCL 500 MG PO TABS
1000.0000 mg | ORAL_TABLET | Freq: Two times a day (BID) | ORAL | Status: DC
  Administered 2017-02-17 – 2017-02-18 (×2): 1000 mg via ORAL
  Filled 2017-02-17 (×3): qty 2

## 2017-02-17 MED ORDER — SODIUM CHLORIDE 0.45 % IV SOLN
INTRAVENOUS | Status: DC
  Administered 2017-02-17 – 2017-02-18 (×2): via INTRAVENOUS

## 2017-02-17 MED ORDER — PANTOPRAZOLE SODIUM 40 MG PO TBEC
40.0000 mg | DELAYED_RELEASE_TABLET | Freq: Every day | ORAL | Status: DC
  Administered 2017-02-18: 40 mg via ORAL
  Filled 2017-02-17: qty 1

## 2017-02-17 MED ORDER — METOCLOPRAMIDE HCL 5 MG/ML IJ SOLN: 5.0000 mg | Freq: Three times a day (TID) | INTRAMUSCULAR | Status: DC | PRN

## 2017-02-17 MED ORDER — MAGNESIUM HYDROXIDE 400 MG/5ML PO SUSP: 30.0000 mL | Freq: Every day | ORAL | Status: DC | PRN

## 2017-02-17 MED ORDER — MECLIZINE HCL 25 MG PO TABS
25.0000 mg | ORAL_TABLET | Freq: Two times a day (BID) | ORAL | Status: DC | PRN
  Filled 2017-02-17: qty 1

## 2017-02-17 MED ORDER — ONDANSETRON HCL 4 MG/2ML IJ SOLN: 4.0000 mg | Freq: Once | INTRAMUSCULAR | Status: DC | PRN

## 2017-02-17 MED ORDER — METOCLOPRAMIDE HCL 10 MG PO TABS: 5.0000 mg | ORAL_TABLET | Freq: Three times a day (TID) | ORAL | Status: DC | PRN

## 2017-02-17 MED ORDER — CLINDAMYCIN PHOSPHATE 600 MG/50ML IV SOLN
INTRAVENOUS | Status: AC
  Filled 2017-02-17: qty 50

## 2017-02-17 MED ORDER — LIDOCAINE HCL (CARDIAC) 20 MG/ML IV SOLN
INTRAVENOUS | Status: DC | PRN
  Administered 2017-02-17: 100 mg via INTRAVENOUS

## 2017-02-17 MED ORDER — MELATONIN 5 MG PO TABS
2.5000 mg | ORAL_TABLET | Freq: Every day | ORAL | Status: DC
Start: 2017-02-17 — End: 2017-02-18
  Filled 2017-02-17 (×2): qty 0.5

## 2017-02-17 MED ORDER — OLANZAPINE 5 MG PO TABS
10.0000 mg | ORAL_TABLET | Freq: Every day | ORAL | Status: DC
  Administered 2017-02-17: 10 mg via ORAL
  Filled 2017-02-17: qty 2

## 2017-02-17 MED ORDER — BUPIVACAINE HCL (PF) 0.5 % IJ SOLN
INTRAMUSCULAR | Status: DC | PRN
  Administered 2017-02-17: 30 mL

## 2017-02-17 MED ORDER — ROCURONIUM BROMIDE 100 MG/10ML IV SOLN
INTRAVENOUS | Status: DC | PRN
  Administered 2017-02-17: 30 mg via INTRAVENOUS

## 2017-02-17 MED ORDER — ATORVASTATIN CALCIUM 20 MG PO TABS
40.0000 mg | ORAL_TABLET | Freq: Every day | ORAL | Status: DC
  Administered 2017-02-17: 40 mg via ORAL
  Filled 2017-02-17: qty 2

## 2017-02-17 MED ORDER — ONDANSETRON HCL 4 MG/2ML IJ SOLN: 4.0000 mg | Freq: Four times a day (QID) | INTRAMUSCULAR | Status: DC | PRN

## 2017-02-17 MED ORDER — FERROUS SULFATE 325 (65 FE) MG PO TABS
325.0000 mg | ORAL_TABLET | Freq: Three times a day (TID) | ORAL | Status: DC
  Administered 2017-02-17 – 2017-02-18 (×3): 325 mg via ORAL
  Filled 2017-02-17 (×3): qty 1

## 2017-02-17 SURGICAL SUPPLY — 39 items
BNDG COHESIVE 4X5 TAN STRL (GAUZE/BANDAGES/DRESSINGS) ×3 IMPLANT
BNDG ESMARK 4X12 TAN STRL LF (GAUZE/BANDAGES/DRESSINGS) ×3 IMPLANT
CANISTER SUCT 1200ML W/VALVE (MISCELLANEOUS) ×3 IMPLANT
CHLORAPREP W/TINT 26ML (MISCELLANEOUS) ×3 IMPLANT
CUFF TOURN 18 STER (MISCELLANEOUS) ×3 IMPLANT
CUFF TOURN 24 STER (MISCELLANEOUS) IMPLANT
DRAPE C-ARM XRAY 36X54 (DRAPES) IMPLANT
ELECT REM PT RETURN 9FT ADLT (ELECTROSURGICAL) ×3
ELECTRODE REM PT RTRN 9FT ADLT (ELECTROSURGICAL) ×1 IMPLANT
GAUZE PETRO XEROFOAM 1X8 (MISCELLANEOUS) ×3 IMPLANT
GAUZE SPONGE 4X4 12PLY STRL (GAUZE/BANDAGES/DRESSINGS) ×3 IMPLANT
GLOVE INDICATOR 8.0 STRL GRN (GLOVE) ×3 IMPLANT
GLOVE SURG ORTHO 8.5 STRL (GLOVE) ×3 IMPLANT
GOWN STRL REUS W/ TWL LRG LVL3 (GOWN DISPOSABLE) ×1 IMPLANT
GOWN STRL REUS W/TWL LRG LVL3 (GOWN DISPOSABLE) ×2
GOWN STRL REUS W/TWL LRG LVL4 (GOWN DISPOSABLE) ×3 IMPLANT
HANDLE YANKAUER SUCT BULB TIP (MISCELLANEOUS) ×3 IMPLANT
K wire ×6 IMPLANT
K-WIRE 2X5 SS THRDED S3 (WIRE) ×6
KIT RM TURNOVER STRD PROC AR (KITS) ×3 IMPLANT
KWIRE 2X5 SS THRDED S3 (WIRE) ×2 IMPLANT
LOOP VESSEL SUPERMAXI WHITE (MISCELLANEOUS) IMPLANT
NEEDLE SPNL 20GX3.5 QUINCKE YW (NEEDLE) ×3 IMPLANT
NS IRRIG 1000ML POUR BTL (IV SOLUTION) ×3 IMPLANT
PACK EXTREMITY ARMC (MISCELLANEOUS) ×3 IMPLANT
PADDING CAST 4IN STRL (MISCELLANEOUS) ×4
PADDING CAST BLEND 4X4 STRL (MISCELLANEOUS) ×2 IMPLANT
PASSER SUT SWANSON 36MM LOOP (INSTRUMENTS) ×3 IMPLANT
SLING ARM LRG DEEP (SOFTGOODS) IMPLANT
SLING ARM M TX990204 (SOFTGOODS) IMPLANT
SPONGE LAP 18X18 5 PK (GAUZE/BANDAGES/DRESSINGS) IMPLANT
STAPLER SKIN PROX 35W (STAPLE) ×3 IMPLANT
STOCKINETTE BIAS CUT 4 980044 (GAUZE/BANDAGES/DRESSINGS) ×3 IMPLANT
STOCKINETTE BIAS CUT 6 980064 (GAUZE/BANDAGES/DRESSINGS) ×3 IMPLANT
STOCKINETTE IMPERVIOUS 9X36 MD (GAUZE/BANDAGES/DRESSINGS) ×3 IMPLANT
SUT VIC AB 2-0 CT1 36 (SUTURE) ×6 IMPLANT
SUT VICRYL 3-0 27IN (SUTURE) ×6 IMPLANT
WIRE CERLCAGE COILS 1.0 10 (WIRE) ×3 IMPLANT
Wire Tray (Wire) ×3 IMPLANT

## 2017-02-17 NOTE — H&P (Signed)
THE PATIENT WAS SEEN PRIOR TO SURGERY TODAY.  HISTORY, ALLERGIES, HOME MEDICATIONS AND OPERATIVE PROCEDURE WERE REVIEWED. RISKS AND BENEFITS OF SURGERY DISCUSSED WITH PATIENT AGAIN.  NO CHANGES FROM INITIAL HISTORY AND PHYSICAL NOTED.    

## 2017-02-17 NOTE — Transfer of Care (Signed)
Immediate Anesthesia Transfer of Care Note  Patient: [name] Sizerdna M Belzer  Procedure(s) Performed: Procedure(s): OPEN REDUCTION INTERNAL FIXATION (ORIF) ELBOW/OLECRANON FRACTURE (Left)  Patient Location: PACU  Anesthesia Type:General  Level of Consciousness: drowsy and patient cooperative  Airway & Oxygen Therapy: Patient Spontanous Breathing and Patient connected to face mask oxygen  Post-op Assessment: Report given to RN and Post -op Vital signs reviewed and stable  Post vital signs: Reviewed and stable  Last Vitals:  Vitals:   02/17/17 1041 02/17/17 1300  BP: (!) 137/55 (!) 141/62  Pulse: 87 89  Resp: 16 18  Temp: (!) 35.7 C 36.8 C  SpO2: 97% 100%    Last Pain:  Vitals:   02/17/17 1300  TempSrc:   PainSc: 0-No pain         Complications: No apparent anesthesia complications

## 2017-02-17 NOTE — Anesthesia Preprocedure Evaluation (Signed)
Anesthesia Evaluation  Patient identified by MRN, date of birth, ID band Patient awake    Reviewed: Allergy & Precautions, H&P , NPO status , Patient's Chart, lab work & pertinent test results, reviewed documented beta blocker date and time   Airway Mallampati: II  TM Distance: >3 FB Neck ROM: full    Dental  (+) Teeth Intact, Poor Dentition   Pulmonary neg pulmonary ROS,    Pulmonary exam normal        Cardiovascular hypertension, + CAD  negative cardio ROS Normal cardiovascular exam Rhythm:regular Rate:Normal     Neuro/Psych PSYCHIATRIC DISORDERS negative neurological ROS  negative psych ROS   GI/Hepatic negative GI ROS, Neg liver ROS, GERD  Medicated,  Endo/Other  negative endocrine ROSdiabetesHypothyroidism   Renal/GU negative Renal ROS  negative genitourinary   Musculoskeletal   Abdominal   Peds  Hematology negative hematology ROS (+)   Anesthesia Other Findings Past Medical History: No date: Alzheimer disease No date: Atrial fibrillation (HCC) No date: CAD (coronary artery disease) No date: Dementia No date: 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 No date: GERD (gastroesophageal reflux disease) No date: Hip fracture (HCC) No date: Hypertension No date: Hypokalemia No date: Hypothyroidism No date: Schizophrenia (HCC) No date: Thyroid disease     Comment:  hypo Past Surgical History: No date: ABDOMINAL HYSTERECTOMY No date: CESAREAN SECTION     Comment:  x3 03/01/2016: HIP ARTHROPLASTY; Left     Comment:  Procedure: ARTHROPLASTY BIPOLAR HIP (HEMIARTHROPLASTY);               Surgeon: Juanell FairlyKevin Krasinski, MD;  Location: [facility] ORS;                Service: Orthopedics;  Laterality: Left;   Reproductive/Obstetrics negative OB ROS                             Anesthesia Physical Anesthesia Plan  ASA: III  Anesthesia Plan: General ETT   Post-op Pain  Management:    Induction:   PONV Risk Score and Plan: 4 or greater and Ondansetron, Dexamethasone, Midazolam and Propofol infusion  Airway Management Planned:   Additional Equipment:   Intra-op Plan:   Post-operative Plan:   Informed Consent: I have reviewed the patients History and Physical, chart, labs and discussed the procedure including the risks, benefits and alternatives for the proposed anesthesia with the patient or authorized representative who has indicated his/her understanding and acceptance.   Dental Advisory Given  Plan Discussed with: CRNA  Anesthesia Plan Comments:         Anesthesia Quick Evaluation

## 2017-02-17 NOTE — H&P (Signed)
[employer] [facility] [place] [facility]   PATIENT NAME: Danielle Boyer    MR#:  [id number]  DATE OF BIRTH:  09/13/1940  DATE OF ADMISSION:  02/17/2017  PRIMARY CARE PHYSICIAN: Marguarite ArbourSparks, Jeffrey D, MD   REQUESTING/REFERRING PHYSICIAN: Dr. Deeann SaintMiller Howard M.D.  CHIEF COMPLAINT:  No chief complaint on file.   HISTORY OF PRESENT ILLNESS: Danielle Cotadna Hoeg  is a 76 y.o. female with a known history of  Alzheimer's dementia, atrial fibrillation, coronary artery disease, diabetes, dementia, GERD, hypertension and hypothyroidism who is admitted by orthopedics for displaced fracture of the olecranon who underwent reduction internal fixation of the elbow. Patient has dementia and unable to provide any review of systems   PAST MEDICAL HISTORY:   Past Medical History:  Diagnosis Date  . Alzheimer disease   . Atrial fibrillation (HCC)   . CAD (coronary artery disease)   . Dementia   . 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 GERD (gastroesophageal reflux disease)   . Hip fracture (HCC)   . Hypertension   . Hypokalemia   . Hypothyroidism   . Schizophrenia (HCC)   . Thyroid disease    hypo    PAST SURGICAL HISTORY: Past Surgical History:  Procedure Laterality Date  . ABDOMINAL HYSTERECTOMY    . CESAREAN SECTION     x3  . HIP ARTHROPLASTY Left 03/01/2016   Procedure: ARTHROPLASTY BIPOLAR HIP (HEMIARTHROPLASTY);  Surgeon: Juanell FairlyKevin Krasinski, MD;  Location: [facility] ORS;  Service: Orthopedics;  Laterality: Left;    SOCIAL HISTORY:  Social History  Substance Use Topics  . Smoking status: Never Smoker  . Smokeless tobacco: Never Used  . Alcohol use No    FAMILY HISTORY:  Family History  Problem Relation Age of Onset  . Breast cancer Sister     DRUG ALLERGIES:  Allergies  Allergen Reactions  . Citalopram Other (See Comments)    Reaction:  Unknown   . Penicillins Rash and Other (See Comments)    Unable to obtain enough information to answer additional questions about this  medication.  Has patient had a PCN reaction causing immediate rash, facial/tongue/throat swelling, SOB or lightheadedness with hypotension: Yes Has patient had a PCN reaction causing severe rash involving mucus membranes or skin necrosis: No Has patient had a PCN reaction that required hospitalization: unknown Has patient had a PCN reaction occurring within the last 10 years: unknown If all of the above answers are "NO", t    REVIEW OF SYSTEMS:   CONSTITUTIONAL:Unable to provide due to dementia   MEDICATIONS AT HOME:  Prior to Admission medications   Medication Sig Start Date End Date Taking? Authorizing Provider  acetaminophen (TYLENOL) 500 MG tablet Take 500 mg by mouth 3 (three) times daily. Take at 8 am, 2 pm, and 6 pm. *Not to exceed 3 gm/24hr*   Yes [provider]  amLODipine (NORVASC) 10 MG tablet Take 10 mg by mouth daily.   Yes [provider]  aspirin 81 MG chewable tablet Chew 324 mg by mouth daily.   Yes [provider]  atorvastatin (LIPITOR) 40 MG tablet Take 1 tablet (40 mg total) by mouth daily at 6 PM. 06/11/15  Yes Jimmy FootmanHernandez-Gonzalez, Andrea, MD  bisacodyl (DULCOLAX) 5 MG EC tablet Take 1 tablet (5 mg total) by mouth daily as needed for moderate constipation. 03/03/16  Yes Shaune Pollackhen, Qing, MD  cyanocobalamin 1000 MCG tablet Take 1 tablet (1,000 mcg total) by mouth daily. 06/11/15  Yes Jimmy FootmanHernandez-Gonzalez, Andrea, MD  esomeprazole (NEXIUM) 20 MG capsule Take 20 mg by mouth daily.    Yes [provider]  ferrous sulfate 325 (65 FE) MG tablet Take 1 tablet (325 mg total) by mouth 3 (three) times daily after meals. 03/03/16  Yes Shaune Pollack, MD  HYDROcodone-acetaminophen (NORCO/VICODIN) 5-325 MG tablet Take 1 tablet by mouth every 4 (four) hours as needed for moderate pain. 09/04/16  Yes Sainani, Rolly Pancake, MD  insulin detemir (LEVEMIR) 100 UNIT/ML injection Inject 0.2 mLs (20 Units total) into the skin daily. 03/03/16  Yes Shaune Pollack, MD  levothyroxine  (SYNTHROID, LEVOTHROID) 50 MCG tablet Take 1 tablet (50 mcg total) by mouth daily before breakfast. 06/11/15  Yes Jimmy Footman, MD  LORazepam (ATIVAN) 0.5 MG tablet Take 1 tablet (0.5 mg total) by mouth every 6 (six) hours as needed for anxiety. And /or agitation. Patient taking differently: Take 0.5 mg by mouth 2 (two) times daily. And /or agitation. 09/04/16  Yes Houston Siren, MD  meclizine (ANTIVERT) 25 MG tablet Take 25 mg by mouth 2 (two) times daily as needed for dizziness.   Yes [provider]  Melatonin 3 MG TABS Take 3 mg by mouth at bedtime.   Yes [provider]  memantine (NAMENDA) 10 MG tablet Take 10 mg by mouth 2 (two) times daily.   Yes [provider]  metFORMIN (GLUCOPHAGE) 500 MG tablet Take 1,000 mg by mouth 2 (two) times daily with a meal.   Yes [provider]  metoprolol (LOPRESSOR) 50 MG tablet Take 50 mg by mouth 2 (two) times daily.   Yes [provider]  nystatin (NYSTATIN) powder Apply topically as needed.   Yes [provider]  OLANZapine (ZYPREXA) 10 MG tablet Take 1 tablet (10 mg total) by mouth at bedtime. 06/04/15  Yes Clapacs, Jackquline Denmark, MD  sertraline (ZOLOFT) 25 MG tablet Take 25 mg by mouth daily.   Yes [provider]  traZODone (DESYREL) 100 MG tablet Take 100 mg by mouth at bedtime.   Yes [provider]  ciprofloxacin (CIPRO) 500 MG tablet Take 1 tablet (500 mg total) by mouth 2 (two) times daily. Patient not taking: Reported on 02/17/2017 02/12/17   Jene Every, MD  docusate sodium (COLACE) 100 MG capsule Take 1 capsule (100 mg total) by mouth 2 (two) times daily. Patient not taking: Reported on 02/10/2017 03/03/16   Shaune Pollack, MD  oxyCODONE (ROXICODONE) 5 MG immediate release tablet Take 1 tablet (5 mg total) by mouth every 6 (six) hours as needed for moderate pain or severe pain. 02/10/17 02/10/18  Nita Sickle, MD      PHYSICAL EXAMINATION:   VITAL SIGNS: Blood  pressure (!) 137/54, pulse 89, temperature 98.3 F (36.8 C), resp. rate 17, height 5\' 3"  (1.6 m), weight 124 lb (56.2 kg), SpO2 95 %.  GENERAL:  76 y.o.-year-old patient lying in the bed with no acute distress.  EYES: Pupils equal, round, reactive to light and accommodation. No scleral icterus. Extraocular muscles intact.  HEENT: Head atraumatic, normocephalic. Oropharynx and nasopharynx clear.  NECK:  Supple, no jugular venous distention. No thyroid enlargement, no tenderness.  LUNGS: Normal breath sounds bilaterally, no wheezing, rales,rhonchi or crepitation. No use of accessory muscles of respiration.  CARDIOVASCULAR: S1, S2 normal. No murmurs, rubs, or gallops.  ABDOMEN: Soft, nontender, nondistended. Bowel sounds present. No organomegaly or mass.  EXTREMITIES: No pedal edema, cyanosis, or clubbing.  NEUROLOGIC: Cranial nerves II through XII are intact. Muscle strength 5/5 in all extremities.  Sensation intact. Gait not checked.  PSYCHIATRIC: Not agitated  SKIN: No obvious rash, lesion, or ulcer.   LABORATORY PANEL:   CBC  Recent Labs Lab 02/12/17 0621  WBC 12.0*  HGB 9.5*  HCT 29.1*  PLT 227  MCV 98.6  MCH 32.3  MCHC 32.7  RDW 15.1*   ------------------------------------------------------------------------------------------------------------------  Chemistries   Recent Labs Lab 02/12/17 0621  NA 142  K 3.9  CL 108  CO2 26  GLUCOSE 275*  BUN 15  CREATININE 0.61  CALCIUM 8.6*  AST 17  ALT 12*  ALKPHOS 60  BILITOT 0.7   ------------------------------------------------------------------------------------------------------------------ estimated creatinine clearance is 47.9 mL/min (by C-G formula based on SCr of 0.61 mg/dL). ------------------------------------------------------------------------------------------------------------------ No results for input(s): TSH, T4TOTAL, T3FREE, THYROIDAB in the last 72 hours.  Invalid input(s): FREET3   Coagulation  profile No results for input(s): INR, PROTIME in the last 168 hours. ------------------------------------------------------------------------------------------------------------------- No results for input(s): DDIMER in the last 72 hours. -------------------------------------------------------------------------------------------------------------------  Cardiac Enzymes  Recent Labs Lab 02/12/17 0621  TROPONINI <0.03   ------------------------------------------------------------------------------------------------------------------ Invalid input(s): POCBNP  ---------------------------------------------------------------------------------------------------------------  Urinalysis    Component Value Date/Time   COLORURINE AMBER (A) 02/12/2017 0622   APPEARANCEUR CLOUDY (A) 02/12/2017 0622   LABSPEC 1.025 02/12/2017 0622   PHURINE 5.0 02/12/2017 0622   GLUCOSEU >=500 (A) 02/12/2017 0622   HGBUR NEGATIVE 02/12/2017 0622   BILIRUBINUR NEGATIVE 02/12/2017 0622   KETONESUR NEGATIVE 02/12/2017 0622   PROTEINUR 30 (A) 02/12/2017 0622   NITRITE NEGATIVE 02/12/2017 0622   LEUKOCYTESUR MODERATE (A) 02/12/2017 0622     RADIOLOGY: No results found.  EKG: Orders placed or performed during the hospital encounter of 02/12/17  . EKG 12-Lead  . EKG 12-Lead    IMPRESSION AND PLAN: Patient is a 76 year old status post left elbow surgery  1. Diabetes type 2 : Continued therapy with Levemir and metformin, I'll place her on sliding scale insulin  2. Essential hypertension continue therapy with amlodipine  3. Hyperlipidemia continue atorvastatin  4. Hypothyroidism unspecified continue level thyroxine  5. GERD continue Protonix  6. Dementia continue therapy with trazodone and Zoloft  7. Recent urinary tract infection check a UA     All the records are reviewed and case discussed with ED provider. Management plans discussed with the patient, family and they are in  agreement.  CODE STATUS:    Code Status Orders        Start     Ordered   02/17/17 1345  Full code  Continuous     02/17/17 1344    Code Status History    Date Active Date Inactive Code Status Order ID Comments User Context   09/01/2016  6:53 AM 09/05/2016  3:39 AM Full Code [id number]  Arnaldo Natal, MD ED   02/29/2016  3:14 PM 03/03/2016  8:20 PM Full Code [id number]  Juanell Fairly, MD Inpatient   02/29/2016  9:19 AM 02/29/2016  3:14 PM Full Code [id number]  Shaune Pollack, MD Inpatient   06/06/2015  1:46 PM 06/12/2015  7:16 PM Full Code [id number]  Clapacs, Jackquline Denmark, MD Inpatient       TOTAL TIME TAKING CARE OF THIS PATIENT: 50 minutes.    Auburn Bilberry M.D on 02/17/2017 at 4:11 PM  Between 7am to 6pm - Pager - [phone]  After 6pm go to [email or web address] - password [email or web address] [facility]  [facility] [place] [facility]  Office  [phone]  CC: Primary care physician; Marguarite Arbour, MD

## 2017-02-17 NOTE — OR Nursing (Signed)
Dr. Pernell DupreAdams and Dr. Hyacinth MeekerMiller are aware that this patient's BP was in the 400's this am at the nursing home. They were instructed by Dr. Pernell DupreAdams to give her levimir insulin. It was reported that this was done and her BS is 274 now. Dr. Pernell DupreAdams and Dr. Hyacinth MeekerMiller are aware of this and Dr. Hyacinth MeekerMiller reports he will admit her to the hospital for observation today. Her daughter is in agreement with this.

## 2017-02-17 NOTE — Progress Notes (Signed)
Patient arrived to floor. VS stable. PIV infusing 1/2 NS @ 75cc/hr. Patient is alert to self only. Lung sounds clear. Heart sounds normal. No complaints of pain at this time, will continue to monitor.   Suzan SlickAlison L Zaylah Blecha, RN

## 2017-02-17 NOTE — Op Note (Signed)
02/17/2017  12:51 PM  PATIENT:  Danielle Boyer    PRE-OPERATIVE DIAGNOSIS:  [id number] displaced fx of olecranon pro with intrarticulate extn lt ulna  POST-OPERATIVE DIAGNOSIS:  Same  PROCEDURE:  OPEN REDUCTION INTERNAL FIXATION (ORIF) ELBOW/OLECRANON FRACTURE LEFT  SURGEON:  Valinda HoarMILLER,Gerline Ratto E, MD  ANESTHESIA:   GENERAL LMA  TOURNIQUET TIME: 52  MIN  COMPLICATIONS: NONE  PREOPERATIVE INDICATIONS:  Danielle Boyer is a  76 y.o. female with a diagnosis of S52.032A displaced fx of olecranon pro with intrarticulate extn lt ulna who failed conservative measures and elected for surgical management.    The risks benefits and alternatives were discussed with the patient preoperatively including but not limited to the risks of infection, bleeding, nerve injury, cardiopulmonary complications, the need for revision surgery, among others, and the patient was willing to proceed.  OPERATIVE IMPLANTS: 2  2.0 K-WIRES   OPERATIVE FINDINGS: COMMINUTED, DISPLACED LEFT OLECRANON FX  OPERATIVE PROCEDURE:  The patient was brought to the operating room after a  supraclavicular block, and underwent general LMA anesthesia.  The arm was prepped and draped sterilely and then exsanguinated.  Tourniquet was inflated to 250 mmHg.  A posterior incision was made starting along the proximal ulna curving laterally and then extending proximally up over the triceps.  Dissection was carried out sharply through subcutaneous tissue down to the posterior bony ridge of the ulna.  Soft tissue was elevated off medially and laterally off the ulna, carefully protecting the ulnar nerve.  The proximal fragment was isolated and the fracture site cleared of all debris and irrigated.  The arm was extended and the fracture was reduced as close to anatomically as possible. There was significant fragmentation.  A sweetheart clamp was used to maintain the reduction while the arm was flexed and 2  2.0 C wires were drilled through the proximal fragment  into the distal fragment.  Drill holes were made in the ulnar shaft distal to the fracture site and a 20-gauge wire passed through these.  The wire was then woven in a figure-of-eight and was tightened on the lateral side of the ulna.  This provided excellent stability to the fracture in flexion and extension.  Fluoroscopy showed the C-wire and the figure-of-eight wire to be in excellent position and alignment.  The fracture was reduced anatomically.  The C-wires  were bent, cut, and buried into the triceps tendon.  The 20-gauge wire was cut, leaving a knot that was buried as well.  Final examination showed excellent range of motion  with no separation at the fracture site.  After final irrigation, the subcutaneous tissue was closed with 2-0 Vicryl.  The skin was closed with staples.  Half percent Marcaine plain was instilled into the soft tissues.  An Aquacel and soft dressing with a posterior splint was applied and allowed to harden at 90 of flexion.  Tourniquet was deflated with good return of blood flow to the hand. Care was taken to minimize movement at the proximal humerus fracture throughout the procedure.  Patient was awakened and after placing a sling on the arm.  She was taken to the PACU in good condition.  Valinda HoarHoward E Danyah Guastella, MD

## 2017-02-17 NOTE — Clinical Social Work Note (Signed)
Clinical Social Work Assessment  Patient Details  Name: Danielle Boyer MRN: [id number] Date of Birth: 04/29/1941  Date of referral:  02/17/17               Reason for consult:  Other (Comment Required) (From [facility] )                Permission sought to share information with:  Oceanographeracility Contact Representative Permission granted to share information::  Yes, Verbal Permission Granted  Name::      [facility] memory care  Agency::     Relationship::     Contact Information:     Housing/Transportation Living arrangements for the past 2 months:  Assisted Living Facility Source of Information:  Facility, Adult Children Patient Interpreter Needed:  None Criminal Activity/Legal Involvement Pertinent to Current Situation/Hospitalization:  No - Comment as needed Significant Relationships:  Adult Children Lives with:  Facility Resident Do you feel safe going back to the place where you live?    Need for family participation in patient care:  Yes (Comment)  Care giving concerns:  [facility] located at [address] [place], [place] fax: [phone].    Social Worker assessment / plan:  Visual merchandiserClinical Social Worker (CSW) reviewed chart and noted that [facility]. Per Liborio NixonJanice patient is in memory care, walks with a walker at baseline and is on room air. Per Liborio NixonJanice since patient's elbow fracture she has been using a wheel chair only because she can't use a walker with the fracture. Liborio NixonJanice reported that patient can return to [facility] and will anticipate D/C tomorrow. Per chart patient is not alert and oriented. CSW contacted patient's daughter Danielle Boyer. Per daughter patient is agreeable for patient to return to [facility] with home health PT [facility] as Liborio NixonJanice requested. Daughter requested that [facility] provide transport. CSW will continue to follow and assist as needed.       Employment status:  Disabled (Comment on whether or not currently receiving Disability) Insurance information:  [place], [employer] PT Recommendations:  Not assessed at this time Information / Referral to community resources:  Other (Comment Required) (Patient will return to [facility]. )  Patient/Family's Response to care:  Patient's daughter is agreeable for patient to return to [facility] memory care.    Patient/Family's Understanding of and Emotional Response to Diagnosis, Current Treatment, and Prognosis:  Patient's daughter was very pleasant and thanked CSW for assistance.   Emotional Assessment Appearance:  Appears stated age Attitude/Demeanor/Rapport:  Unable to Assess Affect (typically observed):  Unable to Assess Orientation:  Oriented to Self, Fluctuating Orientation (Suspected and/or reported Sundowners) Alcohol / Substance use:  Not Applicable Psych involvement (Current and /or in the community):  No (Comment)  Discharge Needs  Concerns to be addressed:  Discharge Planning Concerns Readmission within the last 30 days:  No Current discharge risk:  Chronically ill, Cognitively Impaired Barriers to Discharge:  Continued Medical Work up   [employer], Darleen CrockerBailey M, LCSW 02/17/2017, 3:16 PM

## 2017-02-17 NOTE — Anesthesia Post-op Follow-up Note (Signed)
Anesthesia QCDR form completed.        

## 2017-02-17 NOTE — [place] FL2 (Signed)
[place] MEDICAID FL2 LEVEL OF CARE SCREENING TOOL     IDENTIFICATION  Patient Name: Danielle Boyer Birthdate: 06/27/1941 Sex: female Admission Date (Current Location): 02/17/2017  [facility] [place]:  [place]  ([id number] [facility]) Facility and Address:  [facility], [address], [place], [place] [address]      Provider Number: [id number]  Attending Physician Name and Address:  Deeann SaintMiller, Howard, MD  Relative Name and Phone Number:       Current Level of Care:   Recommended Level of Care: Skilled nursing facility Prior Approval Number:    Date Approved/Denied:   PASRR Number:  [id number])  Discharge Plan: SNF   Current Diagnoses: Primary: Dementia  Patient Active Problem List   Diagnosis Date Noted  . Closed fracture of left olecranon process 02/17/2017  . Sepsis (HCC) 09/01/2016  . Closed left hip fracture (HCC) 02/29/2016  . 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 Essential hypertension 06/07/2015  . Hearing loss 06/07/2015  . Hypothyroidism 06/07/2015  . Cardiovascular disease 06/07/2015  . Atrial fibrillation (HCC) 06/07/2015  . GERD (gastroesophageal reflux disease) 06/07/2015  . IBS (irritable bowel syndrome) 06/07/2015  . Osteoarthritis 06/07/2015  . Paranoid schizophrenia (HCC)   . Schizophrenia (HCC) 06/06/2015  . Diabetes (HCC) 03/19/2015    Orientation RESPIRATION BLADDER Height & Weight     Self  Normal Continent Weight: 124 lb (56.2 kg) Height:  5\' 3"  (160 cm)  BEHAVIORAL SYMPTOMS/MOOD NEUROLOGICAL BOWEL NUTRITION STATUS      Continent Diet (Diet: Full Liquid. )  AMBULATORY STATUS COMMUNICATION OF NEEDS Skin   Limited Assist Verbally Surgical wounds (Incision: Left Elbow )                       Personal Care Assistance Level of Assistance  Bathing, Feeding, Dressing Bathing Assistance: Limited assistance Feeding assistance: Independent Dressing Assistance: Limited assistance     Functional Limitations  Info  Sight, Hearing, Speech Sight Info: Adequate Hearing Info: Adequate Speech Info: Adequate    SPECIAL CARE FACTORS FREQUENCY  PT (By licensed PT)     PT Frequency: 7x a week              Contractures      Additional Factors Info  Code Status, Allergies Code Status Info:  (Full Code. ) Allergies Info:  (Citalopram, Penicillins)           Current Medications (02/17/2017):  This is the current hospital active medication list Current Facility-Administered Medications  Medication Dose Route Frequency Provider Last Rate Last Dose  . 0.45 % sodium chloride infusion   Intravenous Continuous Deeann SaintMiller, Howard, MD 75 mL/hr at 02/17/17 1345    . acetaminophen (TYLENOL) tablet 650 mg  650 mg Oral Q6H PRN Deeann SaintMiller, Howard, MD       Or  . acetaminophen (TYLENOL) suppository 650 mg  650 mg Rectal Q6H PRN Deeann SaintMiller, Howard, MD      . acetaminophen (TYLENOL) tablet 500 mg  500 mg Oral TID Deeann SaintMiller, Howard, MD      . [name][START ON 02/18/2017] amLODipine (NORVASC) tablet 10 mg  10 mg Oral Daily Deeann SaintMiller, Howard, MD      . aspirin chewable tablet 324 mg  324 mg Oral Daily Deeann SaintMiller, Howard, MD      . atorvastatin (LIPITOR) tablet 40 mg  40 mg Oral q1800 Deeann SaintMiller, Howard, MD      . bisacodyl (DULCOLAX) EC tablet 5 mg  5 mg Oral Daily PRN Deeann SaintMiller, Howard, MD      .  bisacodyl (DULCOLAX) suppository 10 mg  10 mg Rectal Daily PRN Deeann Saint, MD      . ceFAZolin (ANCEF) 2-4 GM/100ML-% IVPB           . ceFAZolin (ANCEF) IVPB 2g/100 mL premix  2 g Intravenous Q8H Deeann Saint, MD      . celecoxib (CELEBREX) capsule 200 mg  200 mg Oral Q12H Deeann Saint, MD      . clindamycin (CLEOCIN) IVPB 600 mg  600 mg Intravenous Epimenio Foot, MD      . docusate sodium (COLACE) capsule 100 mg  100 mg Oral BID PRN Deeann Saint, MD      . ferrous sulfate tablet 325 mg  325 mg Oral TID Sherilyn Cooter, MD      . HYDROcodone-acetaminophen [facility] 7.5-325 MG per tablet 1-2 tablet  1-2 tablet Oral Q4H PRN Deeann Saint, MD      . [name] ON 02/18/2017] insulin detemir (LEVEMIR) injection 20 Units  20 Units Subcutaneous Daily Deeann Saint, MD      . [name] ON 02/18/2017] levothyroxine (SYNTHROID, LEVOTHROID) tablet 50 mcg  50 mcg Oral QAC breakfast Deeann Saint, MD      . LORazepam (ATIVAN) tablet 0.5 mg  0.5 mg Oral BID Deeann Saint, MD      . magnesium hydroxide (MILK OF MAGNESIA) suspension 30 mL  30 mL Oral Daily PRN Deeann Saint, MD      . meclizine (ANTIVERT) tablet 25 mg  25 mg Oral BID PRN Deeann Saint, MD      . Melatonin TABS 2.5 mg  2.5 mg Oral QHS Deeann Saint, MD      . memantine [facility]) tablet 10 mg  10 mg Oral BID Deeann Saint, MD      . metFORMIN (GLUCOPHAGE) tablet 1,000 mg  1,000 mg Oral BID WC Deeann Saint, MD      . methocarbamol (ROBAXIN) tablet 500 mg  500 mg Oral Q6H PRN Deeann Saint, MD       Or  . methocarbamol (ROBAXIN) 500 mg in dextrose 5 % 50 mL IVPB  500 mg Intravenous Q6H PRN Deeann Saint, MD      . metoCLOPramide (REGLAN) tablet 5-10 mg  5-10 mg Oral Q8H PRN Deeann Saint, MD       Or  . metoCLOPramide (REGLAN) injection 5-10 mg  5-10 mg Intravenous Q8H PRN Deeann Saint, MD      . metoprolol tartrate (LOPRESSOR) tablet 50 mg  50 mg Oral BID Deeann Saint, MD      . morphine 2 MG/ML injection 1 mg  1 mg Intravenous Q2H PRN Deeann Saint, MD      . OLANZapine (ZYPREXA) tablet 10 mg  10 mg Oral QHS Deeann Saint, MD      . ondansetron [facility]) tablet 4 mg  4 mg Oral Q6H PRN Deeann Saint, MD       Or  . ondansetron [facility]) injection 4 mg  4 mg Intravenous Q6H PRN Deeann Saint, MD      . pantoprazole (PROTONIX) EC tablet 40 mg  40 mg Oral Daily Deeann Saint, MD      . Gwyndolyn Kaufman [facility]) tablet 8.6 mg  1 tablet Oral BID Deeann Saint, MD      . [name] ON 02/18/2017] sertraline (ZOLOFT) tablet 25 mg  25 mg Oral Daily Deeann Saint, MD      . sodium phosphate (FLEET) 7-19 GM/118ML enema 1 enema  1 enema Rectal Once PRN Deeann Saint, MD      .  traZODone (DESYREL) tablet 100 mg  100 mg Oral QHS Deeann Saint, MD      . vitamin B-12 (CYANOCOBALAMIN) tablet 1,000 mcg  1,000 mcg Oral Daily Deeann Saint, MD         Discharge Medications: Please see discharge summary for a list of discharge medications.  Relevant Imaging Results:  Relevant Lab Results:   Additional Information  (SSN: [id number])  Sample, Darleen Crocker, LCSW

## 2017-02-17 NOTE — Care Management Note (Signed)
Case Management Note  Patient Details  Name: Danielle Boyer MRN: [id number] Date of Birth: 11/02/1940  Subjective/Objective:  [facility]. Danielle Boyer at facility is [facility] [place]  TC to daughter Danielle Boyer. Discuss discharge planning. It is anticipated that patient will discharge back to facility tomorrow. She is agreeable to [facility]. Will need home health PT. Referral to Danielle BraunKaren with [facility]. Patient has a walke rbut  has been using her wheelchair                   Action/Plan: Will follow progression.   Expected Discharge Date:                  Expected Discharge Plan:  Home w Home Health Services  In-House Referral:     Discharge planning Services  CM Consult  Post Acute Care Choice:  Home Health Choice offered to:  Adult Children  DME Arranged:    DME Agency:     HH Arranged:  PT HH Agency:  [facility]  Status of Service:  In process, will continue to follow  If discussed at Long Length of Stay Meetings, dates discussed:    Additional Comments:  Danielle MemosLisa M Jabreel Chimento, RN 02/17/2017, 2:32 PM

## 2017-02-17 NOTE — Progress Notes (Signed)
15 minute call to floor. 

## 2017-02-17 NOTE — Anesthesia Procedure Notes (Addendum)
Procedure Name: Intubation Date/Time: 02/17/2017 11:17 AM Performed by: Jonna Clark Pre-anesthesia Checklist: Patient identified, Patient being monitored, Timeout performed, Emergency Drugs available and Suction available Patient Re-evaluated:Patient Re-evaluated prior to induction Oxygen Delivery Method: Circle system utilized Preoxygenation: Pre-oxygenation with 100% oxygen Induction Type: IV induction Ventilation: Mask ventilation without difficulty and Oral airway inserted - appropriate to patient size Laryngoscope Size: Mac and 3 Grade View: Grade I Tube type: Oral Tube size: 7.0 mm Number of attempts: 1 Airway Equipment and Method: Stylet Placement Confirmation: ETT inserted through vocal cords under direct vision,  positive ETCO2 and breath sounds checked- equal and bilateral Secured at: 23 cm Tube secured with: Tape Dental Injury: Teeth and Oropharynx as per pre-operative assessment

## 2017-02-18 DIAGNOSIS — S52032A Displaced fracture of olecranon process with intraarticular extension of left ulna, initial encounter for closed fracture: Secondary | ICD-10-CM | POA: Diagnosis not present

## 2017-02-18 LAB — COMPREHENSIVE METABOLIC PANEL
ALK PHOS: 57 U/L (ref 38–126)
ALT: 9 U/L — AB (ref 14–54)
AST: 10 U/L — ABNORMAL LOW (ref 15–41)
Albumin: 2.6 g/dL — ABNORMAL LOW (ref 3.5–5.0)
Anion gap: 5 (ref 5–15)
BILIRUBIN TOTAL: 0.7 mg/dL (ref 0.3–1.2)
BUN: 10 mg/dL (ref 6–20)
CALCIUM: 7.9 mg/dL — AB (ref 8.9–10.3)
CO2: 28 mmol/L (ref 22–32)
CREATININE: 0.47 mg/dL (ref 0.44–1.00)
Chloride: 110 mmol/L (ref 101–111)
GFR calc non Af Amer: >60 mL/min (ref >60)
Glucose, Bld: 257 mg/dL — ABNORMAL HIGH (ref 65–99)
Potassium: 2.9 mmol/L — ABNORMAL LOW (ref 3.5–5.1)
SODIUM: 143 mmol/L (ref 135–145)
TOTAL PROTEIN: 5.4 g/dL — AB (ref 6.5–8.1)

## 2017-02-18 LAB — GLUCOSE, CAPILLARY
GLUCOSE-CAPILLARY: 305 mg/dL — AB (ref 65–99)
Glucose-Capillary: 264 mg/dL — ABNORMAL HIGH (ref 65–99)

## 2017-02-18 LAB — POTASSIUM: Potassium: 3 mmol/L — ABNORMAL LOW (ref 3.5–5.1)

## 2017-02-18 LAB — MAGNESIUM: Magnesium: 1.5 mg/dL — ABNORMAL LOW (ref 1.7–2.4)

## 2017-02-18 MED ORDER — MAGNESIUM SULFATE 2 GM/50ML IV SOLN
2.0000 g | Freq: Once | INTRAVENOUS | Status: AC
  Administered 2017-02-18: 2 g via INTRAVENOUS
  Filled 2017-02-18: qty 50

## 2017-02-18 MED ORDER — CEPHALEXIN 500 MG PO CAPS: 500.0000 mg | ORAL_CAPSULE | Freq: Two times a day (BID) | ORAL | Status: DC

## 2017-02-18 MED ORDER — POTASSIUM CHLORIDE CRYS ER 20 MEQ PO TBCR
40.0000 meq | EXTENDED_RELEASE_TABLET | Freq: Two times a day (BID) | ORAL | Status: DC
  Administered 2017-02-18: 40 meq via ORAL

## 2017-02-18 MED ORDER — HYDROCODONE-ACETAMINOPHEN 5-325 MG PO TABS: 1.0000 | ORAL_TABLET | Freq: Four times a day (QID) | ORAL | 0 refills | Status: AC | PRN

## 2017-02-18 MED ORDER — POTASSIUM CHLORIDE CRYS ER 20 MEQ PO TBCR: 40.0000 meq | EXTENDED_RELEASE_TABLET | ORAL | Status: DC

## 2017-02-18 MED ORDER — POTASSIUM CHLORIDE CRYS ER 20 MEQ PO TBCR
EXTENDED_RELEASE_TABLET | ORAL | Status: AC
  Filled 2017-02-18: qty 2

## 2017-02-18 MED ORDER — POTASSIUM CHLORIDE CRYS ER 10 MEQ PO TBCR
10.0000 meq | EXTENDED_RELEASE_TABLET | Freq: Two times a day (BID) | ORAL | Status: DC
  Filled 2017-02-18: qty 1

## 2017-02-18 MED ORDER — POTASSIUM CHLORIDE CRYS ER 10 MEQ PO TBCR
10.0000 meq | EXTENDED_RELEASE_TABLET | Freq: Two times a day (BID) | ORAL | Status: DC
  Administered 2017-02-18: 10 meq via ORAL

## 2017-02-18 NOTE — Clinical Social Work Note (Signed)
Patient to be d/c'ed today to [facility].  Patient and family agreeable to plans will transport via ems RN to call report to [phone].  Aala Ransom, MSW, LCSWA [phone]  

## 2017-02-18 NOTE — Progress Notes (Signed)
Subjective:  Mild pain.  Pleasantly confused.    1 Day Post-Op Procedure(s) (LRB): OPEN REDUCTION INTERNAL FIXATION (ORIF) ELBOW/OLECRANON FRACTURE (Left)    Patient reports pain as mild.  Objective:   VITALS:   Vitals:   02/17/17 2306 02/18/17 0725  BP: (!) 150/61 (!) 138/52  Pulse: 97 82  Resp: 18 18  Temp: 99.1 F (37.3 C) 98.4 F (36.9 C)  SpO2: 93% 97%    Neurologically intact ABD soft Neurovascular intact Sensation intact distally Intact pulses distally Dorsiflexion/Plantar flexion intact Incision: dressing C/D/I  LABS No results for input(s): HGB, HCT, WBC, PLT in the last 72 hours.   Recent Labs  02/18/17 0345 02/18/17 1018  NA 143  --   K 2.9* 3.0*  BUN 10  --   CREATININE 0.47  --   GLUCOSE 257*  --     No results for input(s): LABPT, INR in the last 72 hours.   Assessment/Plan: 1 Day Post-Op Procedure(s) (LRB): OPEN REDUCTION INTERNAL FIXATION (ORIF) ELBOW/OLECRANON FRACTURE (Left)   Advance diet D/C IV fluids Discharge home with home health to assisted living

## 2017-02-18 NOTE — Progress Notes (Signed)
RN packed up patient's belongings, EMS called for transport. VS stable, PIV removed.   Suzan SlickAlison L Shenita Trego, RN

## 2017-02-18 NOTE — Progress Notes (Signed)
[facility] - [place] [facility]   PATIENT NAME: Danielle Boyer    MR#:  [id number]  DATE OF BIRTH:  08/17/1940  SUBJECTIVE:  CHIEF COMPLAINT:  No chief complaint on file. Low K and elevated sugars. No new issues,.pleasantly confused REVIEW OF SYSTEMS:  Review of Systems  Unable to perform ROS: Dementia    DRUG ALLERGIES:   Allergies  Allergen Reactions  . Citalopram Other (See Comments)    Reaction:  Unknown   . Penicillins Rash and Other (See Comments)    Unable to obtain enough information to answer additional questions about this medication.  Has patient had a PCN reaction causing immediate rash, facial/tongue/throat swelling, SOB or lightheadedness with hypotension: Yes Has patient had a PCN reaction causing severe rash involving mucus membranes or skin necrosis: No Has patient had a PCN reaction that required hospitalization: unknown Has patient had a PCN reaction occurring within the last 10 years: unknown If all of the above answers are "NO", t   VITALS:  Blood pressure (!) 138/52, pulse 82, temperature 98.4 F (36.9 C), temperature source Oral, resp. rate 18, height 5\' 3"  (1.6 m), weight 56.2 kg (124 lb), SpO2 97 %. PHYSICAL EXAMINATION:  Physical Exam  Constitutional: She is well-developed, well-nourished, and in no distress.  HENT:  Head: Normocephalic and atraumatic.  Eyes: Pupils are equal, round, and reactive to light. Conjunctivae and EOM are normal.  Neck: Normal range of motion. Neck supple. No tracheal deviation present. No thyromegaly present.  Cardiovascular: Normal rate, regular rhythm and normal heart sounds.   Pulmonary/Chest: Effort normal and breath sounds normal. No respiratory distress. She has no wheezes. She exhibits no tenderness.  Abdominal: Soft. Bowel sounds are normal. She exhibits no distension. There is no tenderness.  Musculoskeletal: Normal range of motion.  Neurological: She is alert. She is disoriented. No cranial  nerve deficit.  Pleasantly confused - at baseline form her dementia  Skin: Skin is warm and dry. No rash noted.  Psychiatric: Mood and affect normal.  Pleasantly confused - at baseline form her dementia   LABORATORY PANEL:  Female CBC  Recent Labs Lab 02/12/17 0621  WBC 12.0*  HGB 9.5*  HCT 29.1*  PLT 227   ------------------------------------------------------------------------------------------------------------------ Chemistries   Recent Labs Lab 02/18/17 0345  NA 143  K 2.9*  CL 110  CO2 28  GLUCOSE 257*  BUN 10  CREATININE 0.47  CALCIUM 7.9*  AST 10*  ALT 9*  ALKPHOS 57  BILITOT 0.7   RADIOLOGY:  No results found. ASSESSMENT AND PLAN:  Patient is a 76 year old status post left elbow surgery  * Hypokalemia: replete and recheck  * Uncontrolled Diabetes type 2 : Continued therapy with Levemir and metformin, sliding scale insulin - if she stays we can Consider adding Novolog 0-5 units qhs  * Essential hypertension continue amlodipine and metoprolol  * lt elbow pain - due to displaced fracture s/p OPEN REDUCTION INTERNAL FIXATION (ORIF) ELBOW/OLECRANON FRACTURE LEFT - pain mgmt per ortho  * Hyperlipidemia continue atorvastatin  * Hypothyroidism unspecified continue level thyroxine  * GERD continue Protonix  * Dementia continue therapy with trazodone and Zoloft    Ok to release from Medical stand-point whenever primary team wants to D/C    All the records are reviewed and case discussed with Care Management/Social Worker. Management plans discussed with the patient, nursing and they are in agreement.  CODE STATUS: Full Code  TOTAL TIME TAKING CARE OF THIS PATIENT: 20 minutes.  More than 50% of the time was spent in counseling/coordination of care: YES     Delfino Lovett M.D on 02/18/2017 at 9:53 AM  Between 7am to 6pm - Pager - [phone]  After 6pm go to [email or web address] - Social research officer, government  [facility]    Office  [phone]  CC: Primary care physician; Marguarite Arbour, MD  Note: This dictation was prepared with Dragon dictation along with smaller phrase technology. Any transcriptional errors that result from this process are unintentional.

## 2017-02-18 NOTE — Progress Notes (Signed)
Inpatient Diabetes Program Recommendations  AACE/ADA: New Consensus Statement on Inpatient Glycemic Control (2015)  Target Ranges:  Prepandial:   less than 140 mg/dL      Peak postprandial:   less than 180 mg/dL (1-2 hours)      Critically ill patients:  140 - 180 mg/dL   Lab Results  Component Value Date   GLUCAP 264 (H) 02/18/2017   HGBA1C 6.2 (H) 09/01/2016    Review of Glycemic Control  Results for [name], Rufus M (MRN [id number]) as of 02/18/2017 08:34  Ref. Range 02/17/2017 10:36 02/17/2017 13:12 02/17/2017 17:12 02/17/2017 20:50 02/18/2017 07:44  Glucose-Capillary Latest Ref Range: 65 - 99 mg/dL 119274 (H) 147231 (H) 829233 (H) 304 (H) 264 (H)    Diabetes history: Type 2 Outpatient Diabetes medications: Levemir 20 units q day, Metformin 1000 mg bid Current orders for Inpatient glycemic control: Levemir 20 units qday, Glucophage 1000mg  bid, Novolog 0-9 units tid  Inpatient Diabetes Program Recommendations:  Consider adding Novolog 0-5 units qhs   Susette RacerJulie Jenniefer Salak, RN, [place], [place], CDE Diabetes Coordinator Inpatient Diabetes Program  [phone] (Team Pager) [phone] [facility] Office) 02/18/2017 8:38 AM

## 2017-02-18 NOTE — Discharge Summary (Signed)
Physician Discharge Summary  Patient ID: [name] Sizerdna M Pilch MRN: [id number] DOB/AGE: 78/07/1940 78 y.o.  Admit date: 02/17/2017 Discharge date: 02/18/2017  Admission Diagnoses: Displaced fracture left olecranon and proximal humerus  Discharge Diagnoses: same  Active Problems:   Closed fracture of left olecranon process   Discharged Condition: good  Hospital Course: Minimal pain overnight.  Hospitalist saw patient for diabetes management.  K+ replaced as well.  Consults: Hospitalist  Significant Diagnostic Studies:    Treatments: antibiotics: kefzil and cleocin  Discharge Exam:  Alert but confused.  Dressing dry and csm good.   Disposition: 01-Home or Self Care  Discharge Instructions    Call MD for:  persistant nausea and vomiting    Complete by:  As directed    Call MD for:  redness, tenderness, or signs of infection (pain, swelling, redness, odor or green/yellow discharge around incision site)    Complete by:  As directed    Call MD for:  severe uncontrolled pain    Complete by:  As directed    Call MD for:  temperature >100.4    Complete by:  As directed    Diet - low sodium heart healthy    Complete by:  As directed    Discharge instructions    Complete by:  As directed    Elevate operative arm at all times Move fingers aggressively RTC as appointed   Increase activity slowly    Complete by:  As directed    Leave dressing on - Keep it clean, dry, and intact until clinic visit    Complete by:  As directed      Allergies as of 02/18/2017      Reactions   Citalopram Other (See Comments)   Reaction:  Unknown    Penicillins Rash, Other (See Comments)   Unable to obtain enough information to answer additional questions about this medication.  Has patient had a PCN reaction causing immediate rash, facial/tongue/throat swelling, SOB or lightheadedness with hypotension: Yes Has patient had a PCN reaction causing severe rash involving mucus membranes or skin necrosis:  No Has patient had a PCN reaction that required hospitalization: unknown Has patient had a PCN reaction occurring within the last 10 years: unknown If all of the above answers are "NO", t      Medication List    TAKE these medications   acetaminophen 500 MG tablet Commonly known as:  TYLENOL Take 500 mg by mouth 3 (three) times daily. Take at 8 am, 2 pm, and 6 pm. *Not to exceed 3 gm/24hr*   amLODipine 10 MG tablet Commonly known as:  NORVASC Take 10 mg by mouth daily.   aspirin 81 MG chewable tablet Chew 324 mg by mouth daily.   atorvastatin 40 MG tablet Commonly known as:  LIPITOR Take 1 tablet (40 mg total) by mouth daily at 6 PM.   bisacodyl 5 MG EC tablet Commonly known as:  DULCOLAX Take 1 tablet (5 mg total) by mouth daily as needed for moderate constipation.   ciprofloxacin 500 MG tablet Commonly known as:  CIPRO Take 1 tablet (500 mg total) by mouth 2 (two) times daily.   cyanocobalamin 1000 MCG tablet Take 1 tablet (1,000 mcg total) by mouth daily.   docusate sodium 100 MG capsule Commonly known as:  COLACE Take 1 capsule (100 mg total) by mouth 2 (two) times daily.   esomeprazole 20 MG capsule Commonly known as:  NEXIUM Take 20 mg by mouth daily.   ferrous sulfate 325 (  65 FE) MG tablet Take 1 tablet (325 mg total) by mouth 3 (three) times daily after meals.   HYDROcodone-acetaminophen 5-325 MG tablet Commonly known as:  NORCO/VICODIN Take 1 tablet by mouth every 4 (four) hours as needed for moderate pain. What changed:  Another medication with the same name was added. Make sure you understand how and when to take each.   HYDROcodone-acetaminophen 5-325 MG tablet Commonly known as:  NORCO Take 1 tablet by mouth every 6 (six) hours as needed. What changed:  You were already taking a medication with the same name, and this prescription was added. Make sure you understand how and when to take each.   insulin detemir 100 UNIT/ML injection Commonly known  as:  LEVEMIR Inject 0.2 mLs (20 Units total) into the skin daily.   levothyroxine 50 MCG tablet Commonly known as:  SYNTHROID, LEVOTHROID Take 1 tablet (50 mcg total) by mouth daily before breakfast.   LORazepam 0.5 MG tablet Commonly known as:  ATIVAN Take 1 tablet (0.5 mg total) by mouth every 6 (six) hours as needed for anxiety. And /or agitation. What changed:  when to take this  additional instructions   meclizine 25 MG tablet Commonly known as:  ANTIVERT Take 25 mg by mouth 2 (two) times daily as needed for dizziness.   Melatonin 3 MG Tabs Take 3 mg by mouth at bedtime.   memantine 10 MG tablet Commonly known as:  NAMENDA Take 10 mg by mouth 2 (two) times daily.   metFORMIN 500 MG tablet Commonly known as:  GLUCOPHAGE Take 1,000 mg by mouth 2 (two) times daily with a meal.   metoprolol tartrate 50 MG tablet Commonly known as:  LOPRESSOR Take 50 mg by mouth 2 (two) times daily.   nystatin powder Generic drug:  nystatin Apply topically as needed.   OLANZapine 10 MG tablet Commonly known as:  ZYPREXA Take 1 tablet (10 mg total) by mouth at bedtime.   oxyCODONE 5 MG immediate release tablet Commonly known as:  ROXICODONE Take 1 tablet (5 mg total) by mouth every 6 (six) hours as needed for moderate pain or severe pain.   sertraline 25 MG tablet Commonly known as:  ZOLOFT Take 25 mg by mouth daily.   traZODone 100 MG tablet Commonly known as:  DESYREL Take 100 mg by mouth at bedtime.       Contact information for follow-up providers    Deeann Saint, MD. Schedule an appointment as soon as possible for a visit in 10 day(s).   Specialty:  Specialist Contact information: [address] [place] [address] [phone]            Contact information for after-discharge care    Destination    [facility] Follow up.   Specialty:  Assisted Living Facility Contact information: [address] [place]  [place] [address] [phone]                  Signed: Valinda Hoar 02/18/2017, 12:39 PM

## 2017-02-18 NOTE — Progress Notes (Signed)
[facility] rounding unit visited with pt. Pt appeared confused and stated she did not recognize Ch, and that her ears are broken and could not hear what CH saying to her. Recognizing the dilemma of communicating with pt, CH prepared to exit Rm and that's when pt asked CH to comeback later. CH offered silence prayer and a ministry of presence to pt.    02/18/17 1000  Clinical Encounter Type  Visited With Patient  Visit Type Initial;Spiritual support  Referral From Chaplain  Spiritual Encounters  Spiritual Needs Prayer;Other (Comment)

## 2017-02-18 NOTE — Evaluation (Signed)
Physical Therapy Evaluation Patient Details Name: Danielle Boyer MRN: [id number] DOB: 05/17/1941 Today's Date: 02/18/2017   History of Present Illness  76 y.o. female with a known history of  Alzheimer's dementia, atrial fibrillation, coronary artery disease, diabetes, dementia, GERD, hypertension and hypothyroidism who is admitted by orthopedics for displaced fracture of the olecranon who underwent reduction internal fixation of the elbow.  Clinical Impression  Pt limited functionally with mobility, etc.  Mental status seems to be the biggest limiter, unsure how current status compares to baseline (mentally and physically) however pt was unable to maintain statis sitting balance was unable to get to fully upright; though she did "stand" ~30 seconds leaning legs back on bed and relying heavily on PT (HHA and WBing/stability assist with gait belt).  Second attempt at standing was not as successful, pt was tired, anxious and confused.      Follow Up Recommendations SNF    Equipment Recommendations   (hemiwalker, per progress)    Recommendations for Other Services       Precautions / Restrictions Precautions Precautions: Fall Required Braces or Orthoses: Sling (L UE immobilizer) Restrictions Weight Bearing Restrictions: Yes LUE Weight Bearing: Non weight bearing      Mobility  Bed Mobility Overal bed mobility: Needs Assistance Bed Mobility: Supine to Sit;Sit to Supine     Supine to sit: Mod assist;Max assist Sit to supine: Max assist   General bed mobility comments: Pt did make some attempt to assist getting to sitting, but was very limited and unable to do much on her own  Transfers Overall transfer level: Needs assistance Equipment used: 1 person hand held assist Transfers: Sit to/from Stand Sit to Stand: Max assist         General transfer comment: Pt leaning LEs back onto bed as well as having issues leaning to the R.  She needed heavy assist from PT to stay upright on  both standing attempts and was not safe or confident.  Unsure if pt would use hemiwalker appropriately, may be worth trying?  Ambulation/Gait             General Gait Details: Pt unable to stand w/o heavy assist (and leaning back on bed), not safe to attempt ambulation  Stairs            Wheelchair Mobility    Modified Rankin (Stroke Patients Only)       Balance Overall balance assessment: Needs assistance Sitting-balance support: Single extremity supported Sitting balance-Leahy Scale: Poor Sitting balance - Comments: needing constant assist/cuing/guarding Postural control: Right lateral lean;Posterior lean   Standing balance-Leahy Scale: Zero Standing balance comment: Pt unable to get to full upright even with HHA and heavy assist with gait belt to stay up.  Pt not at all safe, leaning back and to the R.                             Pertinent Vitals/Pain Pain Assessment:  (unable to rate, very pain sensitive with L UE )    Home Living Family/patient expects to be discharged to:: Skilled nursing facility                      Prior Function           Comments: [facility], able to state how much she was doing on her own, it does not appear that she could do a lot?  Hand Dominance        Extremity/Trunk Assessment   Upper Extremity Assessment Upper Extremity Assessment: Difficult to assess due to impaired cognition (R UE with some functional ROM, generally weak)    Lower Extremity Assessment Lower Extremity Assessment: Difficult to assess due to impaired cognition (grossly 3-/5, formal testing impossible)       Communication   Communication: HOH  Cognition   Behavior During Therapy:  (very confused) Overall Cognitive Status: Difficult to assess                                 General Comments: h/o cognitive impairment, unsure how this compares to baseline      General Comments      Exercises      Assessment/Plan    PT Assessment Patient needs continued PT services  PT Problem List Decreased strength;Decreased range of motion;Decreased activity tolerance;Decreased balance;Decreased mobility;Decreased cognition;Decreased coordination;Decreased knowledge of use of DME;Decreased safety awareness;Decreased knowledge of precautions;Pain       PT Treatment Interventions DME instruction;Gait training;Functional mobility training;Stair training;Therapeutic activities;Therapeutic exercise;Balance training;Cognitive remediation;Patient/family education    PT Goals (Current goals can be found in the Care Plan section)  Acute Rehab PT Goals PT Goal Formulation: Patient unable to participate in goal setting Time For Goal Achievement: 03/04/17 Potential to Achieve Goals: Fair    Frequency 7X/week   Barriers to discharge        Co-evaluation               AM-PAC PT "6 Clicks" Daily Activity  Outcome Measure Difficulty turning over in bed (including adjusting bedclothes, sheets and blankets)?: Total Difficulty moving from lying on back to sitting on the side of the bed? : Total Difficulty sitting down on and standing up from a chair with arms (e.g., wheelchair, bedside commode, etc,.)?: Total Help needed moving to and from a bed to chair (including a wheelchair)?: Total Help needed walking in hospital room?: Total Help needed climbing 3-5 steps with a railing? : Total 6 Click Score: 6    End of Session Equipment Utilized During Treatment: Gait belt Activity Tolerance: Patient limited by pain (confusion was very limiting) Patient left: with bed alarm set;with call Pring/phone within reach Nurse Communication: Mobility status PT Visit Diagnosis: Muscle weakness (generalized) (M62.81);Difficulty in walking, not elsewhere classified (R26.2)    Time: 8119-1478 PT Time Calculation (min) (ACUTE ONLY): 28 min   Charges:   PT Evaluation $PT Eval Low Complexity: 1 Low     PT G  Codes:   PT G-Codes **NOT FOR INPATIENT CLASS** Functional Assessment Tool Used: AM-PAC 6 Clicks Basic Mobility Functional Limitation: Mobility: Walking and moving around Mobility: Walking and Moving Around Current Status (G9562): 100 percent impaired, limited or restricted Mobility: Walking and Moving Around Goal Status (Z3086): At least 60 percent but less than 80 percent impaired, limited or restricted    Malachi Pro, DPT 02/18/2017, 10:33 AM

## 2017-02-18 NOTE — Care Management Important Message (Signed)
Important Message  Patient Details  Name: Danielle Boyer MRN: [id number] Date of Birth: 05/23/1941   Medicare Important Message Given:  N/A - LOS <3 / Initial given by admissions    Danielle MemosLisa M Jaslen Adcox, RN 02/18/2017, 1:57 PM

## 2017-02-18 NOTE — Progress Notes (Signed)
Called report to WyocenaBarbara at [facility].

## 2017-02-18 NOTE — Progress Notes (Signed)
K+ down to 2.09, from 3.9 yesterday. On call MD notified. Orders obtained for PO replacement BID.

## 2017-02-23 NOTE — Anesthesia Postprocedure Evaluation (Signed)
Anesthesia Post Note  Patient: Danielle Boyer  Procedure(s) Performed: Procedure(s) (LRB): OPEN REDUCTION INTERNAL FIXATION (ORIF) ELBOW/OLECRANON FRACTURE (Left)  Patient location during evaluation: PACU Anesthesia Type: General Level of consciousness: awake and alert Pain management: pain level controlled Vital Signs Assessment: post-procedure vital signs reviewed and stable Respiratory status: spontaneous breathing, nonlabored ventilation, respiratory function stable and patient connected to nasal cannula oxygen Cardiovascular status: blood pressure returned to baseline and stable Postop Assessment: no signs of nausea or vomiting Anesthetic complications: no     Last Vitals:  Vitals:   02/18/17 0725 02/18/17 1731  BP: (!) 138/52 (!) 137/53  Pulse: 82 87  Resp: 18 16  Temp: 36.9 C   SpO2: 97% 97%    Last Pain:  Vitals:   02/18/17 1028  TempSrc:   PainSc: Asleep                 Yevette Edwards

## 2017-05-21 ENCOUNTER — Emergency Department
Admission: EM | Admit: 2017-05-21 | Discharge: 2017-05-21 | Disposition: A | Discharge status: Home / Self Care | Payer: Medicare Other | Attending: Emergency Medicine | Admitting: Emergency Medicine

## 2017-05-21 ENCOUNTER — Emergency Department: Payer: Medicare Other

## 2017-05-21 DIAGNOSIS — R0789 Other chest pain: Secondary | ICD-10-CM | POA: Diagnosis not present

## 2017-05-21 DIAGNOSIS — G309 Alzheimer's disease, unspecified: Secondary | ICD-10-CM | POA: Diagnosis not present

## 2017-05-21 DIAGNOSIS — E109 Type 1 diabetes mellitus without complications: Secondary | ICD-10-CM | POA: Insufficient documentation

## 2017-05-21 DIAGNOSIS — R079 Chest pain, unspecified: Secondary | ICD-10-CM | POA: Diagnosis present

## 2017-05-21 DIAGNOSIS — Z7982 Long term (current) use of aspirin: Secondary | ICD-10-CM | POA: Diagnosis not present

## 2017-05-21 DIAGNOSIS — E039 Hypothyroidism, unspecified: Secondary | ICD-10-CM | POA: Diagnosis not present

## 2017-05-21 DIAGNOSIS — Z79899 Other long term (current) drug therapy: Secondary | ICD-10-CM | POA: Diagnosis not present

## 2017-05-21 DIAGNOSIS — I2510 Atherosclerotic heart disease of native coronary artery without angina pectoris: Secondary | ICD-10-CM | POA: Diagnosis not present

## 2017-05-21 DIAGNOSIS — I10 Essential (primary) hypertension: Secondary | ICD-10-CM | POA: Diagnosis not present

## 2017-05-21 DIAGNOSIS — Z794 Long term (current) use of insulin: Secondary | ICD-10-CM | POA: Insufficient documentation

## 2017-05-21 DIAGNOSIS — F0280 Dementia in other diseases classified elsewhere without behavioral disturbance: Secondary | ICD-10-CM | POA: Diagnosis not present

## 2017-05-21 LAB — COMPREHENSIVE METABOLIC PANEL
ALK PHOS: 86 U/L (ref 38–126)
ALT: 22 U/L (ref 14–54)
AST: 24 U/L (ref 15–41)
Albumin: 3.4 g/dL — ABNORMAL LOW (ref 3.5–5.0)
Anion gap: 9 (ref 5–15)
BILIRUBIN TOTAL: 0.5 mg/dL (ref 0.3–1.2)
BUN: 13 mg/dL (ref 6–20)
CALCIUM: 8.7 mg/dL — AB (ref 8.9–10.3)
CO2: 26 mmol/L (ref 22–32)
CREATININE: 0.51 mg/dL (ref 0.44–1.00)
Chloride: 107 mmol/L (ref 101–111)
Glucose, Bld: 127 mg/dL — ABNORMAL HIGH (ref 65–99)
Potassium: 3.5 mmol/L (ref 3.5–5.1)
Sodium: 142 mmol/L (ref 135–145)
Total Protein: 6.1 g/dL — ABNORMAL LOW (ref 6.5–8.1)

## 2017-05-21 LAB — CBC WITH DIFFERENTIAL/PLATELET
BASOS ABS: 0 10*3/uL (ref 0–0.1)
BASOS PCT: 1 %
EOS ABS: 0.3 10*3/uL (ref 0–0.7)
EOS PCT: 4 %
HCT: 36 % (ref 35.0–47.0)
HEMOGLOBIN: 11.6 g/dL — AB (ref 12.0–16.0)
Lymphocytes Relative: 22 %
Lymphs Abs: 1.9 10*3/uL (ref 1.0–3.6)
MCH: 31.2 pg (ref 26.0–34.0)
MCHC: 32.4 g/dL (ref 32.0–36.0)
MCV: 96.4 fL (ref 80.0–100.0)
Monocytes Absolute: 0.6 10*3/uL (ref 0.2–0.9)
Monocytes Relative: 7 %
NEUTROS PCT: 66 %
Neutro Abs: 5.6 10*3/uL (ref 1.4–6.5)
PLATELETS: 215 10*3/uL (ref 150–440)
RBC: 3.73 MIL/uL — AB (ref 3.80–5.20)
RDW: 15.4 % — ABNORMAL HIGH (ref 11.5–14.5)
WBC: 8.4 10*3/uL (ref 3.6–11.0)

## 2017-05-21 LAB — TROPONIN I

## 2017-05-21 LAB — LIPASE, BLOOD: LIPASE: 23 U/L (ref 11–51)

## 2017-05-21 NOTE — ED Notes (Signed)
E-signature pad not working, went over discharge instructions w/ patient and daughter, verbalized understanding

## 2017-05-21 NOTE — ED Provider Notes (Addendum)
[facility] Emergency Department Provider Note  ____________________________________________   I have reviewed the triage vital signs and the nursing notes.   HISTORY  Chief Complaint Chest Pain    HPI Danielle Boyer is a 76 y.o. female with a history of Alzheimer's disease, atrial fibrillation, CAD, dementia, anxiety, chronic pain syndrome, nonspecific chest pain, chronically, stent placed in 1999, appendectomy and hysterectomy in the past, presents today complaining of very vaguely described off and on chest pain for "months".  She is not having at this time.  She did have some of it this morning.  On the left side.  Nonradiating.  Nonexertional.  No shortness of breath.  Very poorly described.  It was "an ache".  Level 5 chart caveat; no further history available due to patient status.     Past Medical History:  Diagnosis Date  . Alzheimer disease   . Atrial fibrillation (HCC)   . CAD (coronary artery disease)   . Dementia   . 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 GERD (gastroesophageal reflux disease)   . Hip fracture (HCC)   . Hypertension   . Hypokalemia   . Hypothyroidism   . Schizophrenia (HCC)   . Thyroid disease    hypo    Patient Active Problem List   Diagnosis Date Noted  . Closed fracture of left olecranon process 02/17/2017  . Sepsis (HCC) 09/01/2016  . Closed left hip fracture (HCC) 02/29/2016  . 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 Essential hypertension 06/07/2015  . Hearing loss 06/07/2015  . Hypothyroidism 06/07/2015  . Cardiovascular disease 06/07/2015  . Atrial fibrillation (HCC) 06/07/2015  . GERD (gastroesophageal reflux disease) 06/07/2015  . IBS (irritable bowel syndrome) 06/07/2015  . Osteoarthritis 06/07/2015  . Paranoid schizophrenia (HCC)   . Schizophrenia (HCC) 06/06/2015  . Diabetes (HCC) 03/19/2015    Past Surgical History:  Procedure Laterality Date  . ABDOMINAL HYSTERECTOMY    .  ARTHROPLASTY BIPOLAR HIP (HEMIARTHROPLASTY) Left 03/01/2016   Performed by Juanell FairlyKrasinski, Kevin, MD at [facility] ORS  . CESAREAN SECTION     x3  . OPEN REDUCTION INTERNAL FIXATION (ORIF) ELBOW/OLECRANON FRACTURE Left 02/17/2017   Performed by Deeann SaintMiller, Howard, MD at [facility] ORS    Prior to Admission medications   Medication Sig Start Date End Date Taking? Authorizing Provider  acetaminophen (TYLENOL) 500 MG tablet Take 500 mg by mouth 3 (three) times daily. Take at 8 am, 2 pm, and 6 pm. *Not to exceed 3 gm/24hr*    [provider]  amLODipine (NORVASC) 10 MG tablet Take 10 mg by mouth daily.    [provider]  aspirin 81 MG chewable tablet Chew 324 mg by mouth daily.    [provider]  atorvastatin (LIPITOR) 40 MG tablet Take 1 tablet (40 mg total) by mouth daily at 6 PM. 06/11/15   Jimmy FootmanHernandez-Gonzalez, Andrea, MD  bisacodyl (DULCOLAX) 5 MG EC tablet Take 1 tablet (5 mg total) by mouth daily as needed for moderate constipation. 03/03/16   Shaune Pollackhen, Qing, MD  ciprofloxacin (CIPRO) 500 MG tablet Take 1 tablet (500 mg total) by mouth 2 (two) times daily. Patient not taking: Reported on 02/17/2017 02/12/17   Jene EveryKinner, Robert, MD  cyanocobalamin 1000 MCG tablet Take 1 tablet (1,000 mcg total) by mouth daily. 06/11/15   Jimmy FootmanHernandez-Gonzalez, Andrea, MD  docusate sodium (COLACE) 100 MG capsule Take 1 capsule (100 mg total) by mouth 2 (two) times daily. Patient not taking: Reported on 02/10/2017 03/03/16  Shaune Pollackhen, Qing, MD  esomeprazole (NEXIUM) 20 MG capsule Take 20 mg by mouth daily.     [provider]  ferrous sulfate 325 (65 FE) MG tablet Take 1 tablet (325 mg total) by mouth 3 (three) times daily after meals. 03/03/16   Shaune Pollackhen, Qing, MD  HYDROcodone-acetaminophen [facility](NORCO) 5-325 MG tablet Take 1 tablet by mouth every 6 (six) hours as needed. 02/18/17   Deeann SaintMiller, Howard, MD  HYDROcodone-acetaminophen (NORCO/VICODIN) 5-325 MG tablet Take 1 tablet by mouth every 4 (four) hours as needed for moderate  pain. 09/04/16   Houston SirenSainani, Vivek J, MD  insulin detemir (LEVEMIR) 100 UNIT/ML injection Inject 0.2 mLs (20 Units total) into the skin daily. 03/03/16   Shaune Pollackhen, Qing, MD  levothyroxine (SYNTHROID, LEVOTHROID) 50 MCG tablet Take 1 tablet (50 mcg total) by mouth daily before breakfast. 06/11/15   Jimmy FootmanHernandez-Gonzalez, Andrea, MD  LORazepam (ATIVAN) 0.5 MG tablet Take 1 tablet (0.5 mg total) by mouth every 6 (six) hours as needed for anxiety. And /or agitation. Patient taking differently: Take 0.5 mg by mouth 2 (two) times daily. And /or agitation. 09/04/16   Houston SirenSainani, Vivek J, MD  meclizine (ANTIVERT) 25 MG tablet Take 25 mg by mouth 2 (two) times daily as needed for dizziness.    [provider]  Melatonin 3 MG TABS Take 3 mg by mouth at bedtime.    [provider]  memantine (NAMENDA) 10 MG tablet Take 10 mg by mouth 2 (two) times daily.    [provider]  metFORMIN (GLUCOPHAGE) 500 MG tablet Take 1,000 mg by mouth 2 (two) times daily with a meal.    [provider]  metoprolol (LOPRESSOR) 50 MG tablet Take 50 mg by mouth 2 (two) times daily.    [provider]  nystatin (NYSTATIN) powder Apply topically as needed.    [provider]  OLANZapine (ZYPREXA) 10 MG tablet Take 1 tablet (10 mg total) by mouth at bedtime. 06/04/15   Clapacs, Jackquline DenmarkJohn T, MD  oxyCODONE (ROXICODONE) 5 MG immediate release tablet Take 1 tablet (5 mg total) by mouth every 6 (six) hours as needed for moderate pain or severe pain. 02/10/17 02/10/18  Nita SickleVeronese, Weston, MD  sertraline (ZOLOFT) 25 MG tablet Take 25 mg by mouth daily.    [provider]  traZODone (DESYREL) 100 MG tablet Take 100 mg by mouth at bedtime.    [provider]    Allergies Citalopram and Penicillins  Family History  Problem Relation Age of Onset  . Breast cancer Sister     Social History Social History   Tobacco Use  . Smoking status: Never Smoker  . Smokeless tobacco: Never Used   Substance Use Topics  . Alcohol use: No  . Drug use: No    Review of Systems Constitutional: No fever/chills Eyes: No visual changes. ENT: No sore throat. No stiff neck no neck pain Cardiovascular: see hpi chest pain. Respiratory: Denies shortness of breath. Gastrointestinal:   no vomiting.  No diarrhea.  No constipation. Genitourinary: Negative for dysuria. Musculoskeletal: Negative lower extremity swelling Skin: Negative for rash. Neurological: Negative for severe headaches, focal weakness or numbness.   ____________________________________________   PHYSICAL EXAM:  VITAL SIGNS: ED Triage Vitals [05/21/17 1131]  Enc Vitals Group     BP 131/68     Pulse Rate 62     Resp 16     Temp 98.5 F (36.9 C)     Temp Source Oral     SpO2 96 %  Weight 124 lb (56.2 kg)     Height      Head Circumference      Peak Flow      Pain Score      Pain Loc      Pain Edu?      Excl. in GC?     Constitutional: Alert and oriented. Well appearing and in no acute distress. Eyes: Conjunctivae are normal Head: Atraumatic HEENT: No congestion/rhinnorhea. Mucous membranes are moist.  Oropharynx non-erythematous Neck:   Nontender with no meningismus, no masses, no stridor Cardiovascular: Normal rate, regular rhythm. Grossly normal heart sounds.  Good peripheral circulation. Respiratory: Normal respiratory effort.  No retractions. Lungs CTAB. Abdominal: Soft and nontender. No distention. No guarding no rebound Back:  There is no focal tenderness or step off.  there is no midline tenderness there are no lesions noted. there is no CVA tenderness Musculoskeletal: No lower extremity tenderness, no upper extremity tenderness. No joint effusions, no DVT signs strong distal pulses no edema Neurologic:  Normal speech and language. No gross focal neurologic deficits are appreciated.  Skin:  Skin is warm, dry and intact. No rash noted. Psychiatric: Mood and affect are normal. Speech and behavior  are normal.  ____________________________________________   LABS (all labs ordered are listed, but only abnormal results are displayed)  Labs Reviewed  CBC WITH DIFFERENTIAL/PLATELET  TROPONIN I  COMPREHENSIVE METABOLIC PANEL  LIPASE, BLOOD    Pertinent labs  results that were available during my care of the patient were reviewed by me and considered in my medical decision making (see chart for details). ____________________________________________  EKG  I personally interpreted any EKGs ordered by me or triage Sinus rhythm rate 59 bpm no acute ST elevation no acute ST depression normal axis no acute ischemic changes ____________________________________________  RADIOLOGY  Pertinent labs & imaging results that were available during my care of the patient were reviewed by me and considered in my medical decision making (see chart for details). If possible, patient and/or family made aware of any abnormal findings.  No results found. ____________________________________________    PROCEDURES  Procedure(s) performed: None  Procedures  Critical Care performed: None  ____________________________________________   INITIAL IMPRESSION / ASSESSMENT AND PLAN / ED COURSE  Pertinent labs & imaging results that were available during my care of the patient were reviewed by me and considered in my medical decision making (see chart for details).  Patient with poorly described chest pain chronic pain, and a stent placed in 1999 presents today complaining of left-sided chest pain which is now gone.  She apparently had this morning.  We will send cardiac enzymes and reassess no evidence of ACS PE or dissection on initial exam.  There is some degree of reproducibility  ----------------------------------------- 1:32 PM on 05/21/2017 -----------------------------------------  Patient has no chest pain and no recollection of chest pain and feels that she is at her baseline.  We will  send a second cardiac enzyme.  Family are at bedside.  They think that she probably needed "a Coca-Cola and a burp" we will give her Coca-Cola here as that is her request.  If second cardiac enzyme is negative it is my hope that we can safely get the patient home with outpatient follow-up.    ____________________________________________   FINAL CLINICAL IMPRESSION(S) / ED DIAGNOSES  Final diagnoses:  None      This chart was dictated using voice recognition software.  Despite best efforts to proofread,  errors can occur which can change  meaning.      Jeanmarie Plant, MD 05/21/17 1152    Jeanmarie Plant, MD 05/21/17 [phone]

## 2017-05-21 NOTE — ED Triage Notes (Signed)
Pt came to ED via EMS from [facility] c/o left sided chest pain starting this morning. Pt has history of dementia, diabetes. Denies pain currently. VS stable.

## 2018-03-22 ENCOUNTER — Other Ambulatory Visit: Payer: Self-pay

## 2018-03-22 ENCOUNTER — Emergency Department
Admission: EM | Admit: 2018-03-22 | Discharge: 2018-03-23 | Disposition: A | Discharge status: Home / Self Care | Payer: Medicare Other | Attending: Emergency Medicine | Admitting: Emergency Medicine

## 2018-03-22 DIAGNOSIS — I10 Essential (primary) hypertension: Secondary | ICD-10-CM | POA: Diagnosis not present

## 2018-03-22 DIAGNOSIS — E039 Hypothyroidism, unspecified: Secondary | ICD-10-CM | POA: Diagnosis not present

## 2018-03-22 DIAGNOSIS — I2510 Atherosclerotic heart disease of native coronary artery without angina pectoris: Secondary | ICD-10-CM | POA: Diagnosis not present

## 2018-03-22 DIAGNOSIS — Z7982 Long term (current) use of aspirin: Secondary | ICD-10-CM | POA: Diagnosis not present

## 2018-03-22 DIAGNOSIS — R197 Diarrhea, unspecified: Secondary | ICD-10-CM | POA: Diagnosis present

## 2018-03-22 DIAGNOSIS — Z79899 Other long term (current) drug therapy: Secondary | ICD-10-CM | POA: Diagnosis not present

## 2018-03-22 DIAGNOSIS — E119 Type 2 diabetes mellitus without complications: Secondary | ICD-10-CM | POA: Insufficient documentation

## 2018-03-22 DIAGNOSIS — N390 Urinary tract infection, site not specified: Secondary | ICD-10-CM | POA: Diagnosis not present

## 2018-03-22 LAB — CBC
HCT: 35.8 % (ref 35.0–47.0)
HEMOGLOBIN: 12.2 g/dL (ref 12.0–16.0)
MCH: 34.8 pg — AB (ref 26.0–34.0)
MCHC: 34.1 g/dL (ref 32.0–36.0)
MCV: 102 fL — ABNORMAL HIGH (ref 80.0–100.0)
PLATELETS: 205 10*3/uL (ref 150–440)
RBC: 3.51 MIL/uL — AB (ref 3.80–5.20)
RDW: 13.7 % (ref 11.5–14.5)
WBC: 11.4 10*3/uL — AB (ref 3.6–11.0)

## 2018-03-22 NOTE — ED Triage Notes (Signed)
Patient coming from [facility] [address] For water black stool. EMS states that they were told by one staff this is normal for her with one of her meds but the staff that called for EMS was new. Patient has hx of dementia.

## 2018-03-23 DIAGNOSIS — R197 Diarrhea, unspecified: Secondary | ICD-10-CM | POA: Diagnosis not present

## 2018-03-23 LAB — LIPASE, BLOOD: Lipase: 29 U/L (ref 11–51)

## 2018-03-23 LAB — URINALYSIS, COMPLETE (UACMP) WITH MICROSCOPIC
BILIRUBIN URINE: NEGATIVE
Glucose, UA: NEGATIVE mg/dL
HGB URINE DIPSTICK: NEGATIVE
Ketones, ur: NEGATIVE mg/dL
Nitrite: POSITIVE — AB
PH: 5 (ref 5.0–8.0)
Protein, ur: NEGATIVE mg/dL
SPECIFIC GRAVITY, URINE: 1.021 (ref 1.005–1.030)
Squamous Epithelial / LPF: NONE SEEN (ref 0–5)
WBC, UA: >50 WBC/hpf — ABNORMAL HIGH (ref 0–5)

## 2018-03-23 LAB — GASTROINTESTINAL PANEL BY PCR, STOOL (REPLACES STOOL CULTURE)
ASTROVIRUS: NOT DETECTED
Adenovirus F40/41: NOT DETECTED
CAMPYLOBACTER SPECIES: NOT DETECTED
CYCLOSPORA CAYETANENSIS: NOT DETECTED
Cryptosporidium: NOT DETECTED
ENTAMOEBA HISTOLYTICA: NOT DETECTED
ENTEROTOXIGENIC E COLI (ETEC): NOT DETECTED
Enteroaggregative E coli (EAEC): NOT DETECTED
Enteropathogenic E coli (EPEC): NOT DETECTED
Giardia lamblia: NOT DETECTED
Norovirus GI/GII: NOT DETECTED
PLESIMONAS SHIGELLOIDES: NOT DETECTED
Rotavirus A: NOT DETECTED
SALMONELLA SPECIES: NOT DETECTED
SAPOVIRUS (I, II, IV, AND V): NOT DETECTED
SHIGA LIKE TOXIN PRODUCING E COLI (STEC): NOT DETECTED
SHIGELLA/ENTEROINVASIVE E COLI (EIEC): NOT DETECTED
VIBRIO CHOLERAE: NOT DETECTED
Vibrio species: NOT DETECTED
Yersinia enterocolitica: NOT DETECTED

## 2018-03-23 LAB — COMPREHENSIVE METABOLIC PANEL
ALT: 19 U/L (ref 0–44)
ANION GAP: 10 (ref 5–15)
AST: 20 U/L (ref 15–41)
Albumin: 3.8 g/dL (ref 3.5–5.0)
Alkaline Phosphatase: 63 U/L (ref 38–126)
BILIRUBIN TOTAL: 0.4 mg/dL (ref 0.3–1.2)
BUN: 21 mg/dL (ref 8–23)
CALCIUM: 9.1 mg/dL (ref 8.9–10.3)
CO2: 25 mmol/L (ref 22–32)
Chloride: 109 mmol/L (ref 98–111)
Creatinine, Ser: 0.63 mg/dL (ref 0.44–1.00)
Glucose, Bld: 110 mg/dL — ABNORMAL HIGH (ref 70–99)
Potassium: 3.7 mmol/L (ref 3.5–5.1)
Sodium: 144 mmol/L (ref 135–145)
TOTAL PROTEIN: 6.7 g/dL (ref 6.5–8.1)

## 2018-03-23 LAB — C DIFFICILE QUICK SCREEN W PCR REFLEX
C DIFFICLE (CDIFF) ANTIGEN: NEGATIVE
C Diff interpretation: NOT DETECTED
C Diff toxin: NEGATIVE

## 2018-03-23 MED ORDER — SODIUM CHLORIDE 0.9 % IV SOLN
1.0000 g | Freq: Once | INTRAVENOUS | Status: AC
  Administered 2018-03-23: 1 g via INTRAVENOUS
  Filled 2018-03-23: qty 10

## 2018-03-23 MED ORDER — LOPERAMIDE HCL 2 MG PO CAPS
2.0000 mg | ORAL_CAPSULE | Freq: Once | ORAL | Status: AC
  Administered 2018-03-23: 2 mg via ORAL
  Filled 2018-03-23: qty 1

## 2018-03-23 MED ORDER — CEPHALEXIN 500 MG PO CAPS: 500.0000 mg | ORAL_CAPSULE | Freq: Three times a day (TID) | ORAL | 0 refills | Status: AC

## 2018-03-23 NOTE — ED Notes (Signed)
Patient given warm blankets.

## 2018-03-23 NOTE — Discharge Instructions (Addendum)
1.  Take antibiotic as prescribed (Keflex 500 mg 3 times daily x7 days). 2.  You may take Imodium as needed for diarrhea. 3.  Return to the ER for worsening symptoms, persistent vomiting, fever or other concerns.

## 2018-03-23 NOTE — ED Notes (Signed)
Nurse checked if patient had bowel movement and patient had passed gas but had no stool and obtained urine sample

## 2018-03-23 NOTE — ED Notes (Signed)
Patient had a bowel movement of black watery stool. Nurse cleaned up patient

## 2018-03-23 NOTE — ED Notes (Signed)
Patient had another episode of water stool and was cleaned up and barrier cream applied

## 2018-03-23 NOTE — ED Provider Notes (Signed)
[facility] Emergency Department Provider Note   ____________________________________________   First MD Initiated Contact with Patient 03/23/18 0021     (approximate)  I have reviewed the triage vital signs and the nursing notes.   HISTORY  Chief Complaint Diarrhea  Level V caveat: Limited by dementia  HPI Danielle Boyer is a 77 y.o. female brought to the ED via EMS from [facility] for black and watery stools.  EMS reports one staff member told him this is normal for her because of her medications.  However, the staff member who called EMS is new to the patient.  And herself voices no complaints.  Specifically, denies recent fever, chills, chest pain, shortness of breath, abdominal pain, nausea or vomiting.   Past Medical History:  Diagnosis Date  . Alzheimer disease   . Atrial fibrillation (HCC)   . CAD (coronary artery disease)   . Dementia   . 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 GERD (gastroesophageal reflux disease)   . Hip fracture (HCC)   . Hypertension   . Hypokalemia   . Hypothyroidism   . Schizophrenia (HCC)   . Thyroid disease    hypo    Patient Active Problem List   Diagnosis Date Noted  . Closed fracture of left olecranon process 02/17/2017  . Sepsis (HCC) 09/01/2016  . Closed left hip fracture (HCC) 02/29/2016  . 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 Essential hypertension 06/07/2015  . Hearing loss 06/07/2015  . Hypothyroidism 06/07/2015  . Cardiovascular disease 06/07/2015  . Atrial fibrillation (HCC) 06/07/2015  . GERD (gastroesophageal reflux disease) 06/07/2015  . IBS (irritable bowel syndrome) 06/07/2015  . Osteoarthritis 06/07/2015  . Paranoid schizophrenia (HCC)   . Schizophrenia (HCC) 06/06/2015  . Diabetes (HCC) 03/19/2015    Past Surgical History:  Procedure Laterality Date  . ABDOMINAL HYSTERECTOMY    . CESAREAN SECTION     x3  . HIP ARTHROPLASTY Left 03/01/2016     Procedure: ARTHROPLASTY BIPOLAR HIP (HEMIARTHROPLASTY);  Surgeon: Juanell Fairly, MD;  Location: [facility] ORS;  Service: Orthopedics;  Laterality: Left;  . ORIF ELBOW FRACTURE Left 02/17/2017   Procedure: OPEN REDUCTION INTERNAL FIXATION (ORIF) ELBOW/OLECRANON FRACTURE;  Surgeon: Deeann Saint, MD;  Location: [facility] ORS;  Service: Orthopedics;  Laterality: Left;    Prior to Admission medications   Medication Sig Start Date End Date Taking? Authorizing Provider  acetaminophen (TYLENOL) 500 MG tablet Take 500 mg by mouth 3 (three) times daily. Take at 8 am, 2 pm, and 6 pm. *Not to exceed 3 gm/24hr*    [provider]  amLODipine (NORVASC) 10 MG tablet Take 10 mg by mouth daily.    [provider]  aspirin 81 MG chewable tablet Chew 324 mg by mouth daily.    [provider]  atorvastatin (LIPITOR) 40 MG tablet Take 1 tablet (40 mg total) by mouth daily at 6 PM. Patient not taking: Reported on 05/21/2017 06/11/15   Jimmy Footman, MD  bisacodyl (DULCOLAX) 5 MG EC tablet Take 1 tablet (5 mg total) by mouth daily as needed for moderate constipation. Patient not taking: Reported on 05/21/2017 03/03/16   Shaune Pollack, MD  ciprofloxacin (CIPRO) 500 MG tablet Take 1 tablet (500 mg total) by mouth 2 (two) times daily. Patient not taking: Reported on 02/17/2017 02/12/17   Jene Every, MD  cyanocobalamin 1000 MCG tablet Take 1 tablet (1,000 mcg total) by mouth daily. Patient not taking: Reported on 05/21/2017 06/11/15  Jimmy Footman, MD  docusate sodium (COLACE) 100 MG capsule Take 1 capsule (100 mg total) by mouth 2 (two) times daily. 03/03/16   Shaune Pollack, MD  ergocalciferol (VITAMIN D2) 50000 units capsule Take 50,000 Units once a week by mouth.    [provider]  ferrous sulfate 325 (65 FE) MG tablet Take 1 tablet (325 mg total) by mouth 3 (three) times daily after meals. 03/03/16   Shaune Pollack, MD  HYDROcodone-acetaminophen [facility]) 5-325 MG tablet  Take 1 tablet by mouth every 6 (six) hours as needed. 02/18/17   Deeann Saint, MD  insulin detemir (LEVEMIR) 100 UNIT/ML injection Inject 0.2 mLs (20 Units total) into the skin daily. Patient not taking: Reported on 05/21/2017 03/03/16   Shaune Pollack, MD  Insulin Glargine [facility] ) Inject 20 Units daily into the skin.    [provider]  levothyroxine (SYNTHROID, LEVOTHROID) 50 MCG tablet Take 1 tablet (50 mcg total) by mouth daily before breakfast. 06/11/15   Jimmy Footman, MD  LORazepam (ATIVAN) 0.5 MG tablet Take 1 tablet (0.5 mg total) by mouth every 6 (six) hours as needed for anxiety. And /or agitation. Patient not taking: Reported on 05/21/2017 09/04/16   Houston Siren, MD  Melatonin 3 MG TABS Take 3 mg by mouth at bedtime.    [provider]  memantine (NAMENDA) 10 MG tablet Take 10 mg by mouth 2 (two) times daily.    [provider]  metFORMIN (GLUCOPHAGE) 500 MG tablet Take 500 mg 2 (two) times daily with a meal by mouth.     [provider]  metoprolol (LOPRESSOR) 50 MG tablet Take 50 mg by mouth 2 (two) times daily.    [provider]  Multiple Vitamin (THEREMS PO) Take 1 tablet daily by mouth.    [provider]  OLANZapine (ZYPREXA) 10 MG tablet Take 1 tablet (10 mg total) by mouth at bedtime. Patient not taking: Reported on 05/21/2017 06/04/15   Clapacs, Jackquline Denmark, MD  omeprazole (PRILOSEC) 20 MG capsule Take 20 mg daily by mouth.    [provider]  sertraline (ZOLOFT) 25 MG tablet Take 12.5 mg daily by mouth.     [provider]  traZODone (DESYREL) 50 MG tablet Take 12.5 mg 2 (two) times daily by mouth.     [provider]    Allergies Citalopram and Penicillins  Family History  Problem Relation Age of Onset  . Breast cancer Sister     Social History Social History   Tobacco Use  . Smoking status: Never Smoker  . Smokeless tobacco: Never Used  Substance Use Topics    . Alcohol use: No  . Drug use: No    Review of Systems  Constitutional: No fever/chills Eyes: No visual changes. ENT: No sore throat. Cardiovascular: Denies chest pain. Respiratory: Denies shortness of breath. Gastrointestinal: No abdominal pain.  No nausea, no vomiting.  Positive for black and watery stools.  No constipation. Genitourinary: Negative for dysuria. Musculoskeletal: Negative for back pain. Skin: Negative for rash. Neurological: Negative for headaches, focal weakness or numbness.   ____________________________________________   PHYSICAL EXAM:  VITAL SIGNS: ED Triage Vitals  Enc Vitals Group     BP 03/22/18 2330 (!) 125/58     Pulse Rate 03/22/18 2330 76     Resp 03/22/18 2331 18     Temp 03/22/18 2331 98.7 F (37.1 C)     Temp Source 03/22/18 2331 Oral     SpO2 03/22/18 2330 95 %  Weight --      Height --      Head Circumference --      Peak Flow --      Pain Score 03/22/18 2332 0     Pain Loc --      Pain Edu? --      Excl. in GC? --     Constitutional: Alert and oriented. Well appearing and in no acute distress. Eyes: Conjunctivae are normal. PERRL. EOMI. Head: Atraumatic. Nose: No congestion/rhinnorhea. Mouth/Throat: Mucous membranes are moist.  Oropharynx non-erythematous. Neck: No stridor.   Cardiovascular: Normal rate, regular rhythm. Grossly normal heart sounds.  Good peripheral circulation. Respiratory: Normal respiratory effort.  No retractions. Lungs CTAB. Gastrointestinal: Soft and nontender to light or deep palpation. No distention. No abdominal bruits. No CVA tenderness. Musculoskeletal: No lower extremity tenderness nor edema.  No joint effusions. Neurologic:  Normal speech and language. No gross focal neurologic deficits are appreciated. No gait instability. Skin:  Skin is warm, dry and intact. No rash noted. Psychiatric: Mood and affect are normal. Speech and behavior are normal.  ____________________________________________    LABS (all labs ordered are listed, but only abnormal results are displayed)  Labs Reviewed  COMPREHENSIVE METABOLIC PANEL - Abnormal; Notable for the following components:      Result Value   Glucose, Bld 110 (*)    All other components within normal limits  CBC - Abnormal; Notable for the following components:   WBC 11.4 (*)    RBC 3.51 (*)    MCV 102.0 (*)    MCH 34.8 (*)    All other components within normal limits  URINALYSIS, COMPLETE (UACMP) WITH MICROSCOPIC - Abnormal; Notable for the following components:   Color, Urine AMBER (*)    APPearance HAZY (*)    Nitrite POSITIVE (*)    Leukocytes, UA MODERATE (*)    WBC, UA >50 (*)    Bacteria, UA MANY (*)    All other components within normal limits  C DIFFICILE QUICK SCREEN W PCR REFLEX  GASTROINTESTINAL PANEL BY PCR, STOOL (REPLACES STOOL CULTURE)  URINE CULTURE  LIPASE, BLOOD   ____________________________________________  EKG  None ____________________________________________  RADIOLOGY  ED MD interpretation: None  Official radiology report(s): No results found.  ____________________________________________   PROCEDURES  Procedure(s) performed:   Rectal exam: External exam remarkable for dark green, loose stool.  No external hemorrhoids noted.  Heme negative.   Procedures  Critical Care performed: No  ____________________________________________   INITIAL IMPRESSION / ASSESSMENT AND PLAN / ED COURSE  As part of my medical decision making, I reviewed the following data within the electronic MEDICAL RECORD NUMBER Nursing notes reviewed and incorporated, Labs reviewed, Old chart reviewed and Notes from prior ED visits   77 year old female coming from [facility] with black and watery stools which evidently are normal for her.  Differential diagnosis includes but is not limited to baseline stool appearance, C. difficile, gastroenteritis, appearance on iron, melena, etc.  Laboratory  results remarkable for UTI.  Will administer 1 g IV Rocephin.  Will collect stool specimen for C. difficile and bio fire.   Clinical Course as of Mar 23 426  Wed Mar 23, 2018  0155 C. difficile noted to be negative.  Imodium given for continued loose stools.   [JS]  0425 Results of biofire received which is negative.  Will discharge back to facility on Keflex for UTI and as needed Imodium for loose stools.  Strict return precautions given.  Patient  verbalizes understanding and agrees with plan of care.   [JS]    Clinical Course User Index [JS] Irean Hong, MD     ____________________________________________   FINAL CLINICAL IMPRESSION(S) / ED DIAGNOSES  Final diagnoses:  Diarrhea in adult patient  Urinary tract infection without hematuria, site unspecified     ED Discharge Orders    None       Note:  This document was prepared using Dragon voice recognition software and may include unintentional dictation errors.    Irean Hong, MD 03/23/18 [phone]

## 2018-03-23 NOTE — ED Notes (Signed)
Patient had soiled themselves and this nurse and with assistance from another nurse and tech cleaned up patient. Patient had another watery bowel stool while being cleaned up.

## 2018-03-26 LAB — URINE CULTURE

## 2019-12-06 ENCOUNTER — Encounter: Payer: Self-pay | Admitting: Intensive Care

## 2019-12-06 ENCOUNTER — Other Ambulatory Visit: Payer: Self-pay

## 2019-12-06 ENCOUNTER — Emergency Department: Payer: Medicare Other

## 2019-12-06 ENCOUNTER — Emergency Department
Admission: EM | Admit: 2019-12-06 | Discharge: 2019-12-07 | Disposition: A | Discharge status: Skilled Nursing Facility | Payer: Medicare Other | Attending: Emergency Medicine | Admitting: Emergency Medicine

## 2019-12-06 DIAGNOSIS — G309 Alzheimer's disease, unspecified: Secondary | ICD-10-CM | POA: Insufficient documentation

## 2019-12-06 DIAGNOSIS — R112 Nausea with vomiting, unspecified: Secondary | ICD-10-CM | POA: Diagnosis not present

## 2019-12-06 DIAGNOSIS — R1032 Left lower quadrant pain: Secondary | ICD-10-CM | POA: Insufficient documentation

## 2019-12-06 DIAGNOSIS — I119 Hypertensive heart disease without heart failure: Secondary | ICD-10-CM | POA: Insufficient documentation

## 2019-12-06 DIAGNOSIS — R4182 Altered mental status, unspecified: Secondary | ICD-10-CM | POA: Diagnosis present

## 2019-12-06 DIAGNOSIS — Z79899 Other long term (current) drug therapy: Secondary | ICD-10-CM | POA: Insufficient documentation

## 2019-12-06 DIAGNOSIS — I2510 Atherosclerotic heart disease of native coronary artery without angina pectoris: Secondary | ICD-10-CM | POA: Insufficient documentation

## 2019-12-06 DIAGNOSIS — N390 Urinary tract infection, site not specified: Secondary | ICD-10-CM | POA: Diagnosis not present

## 2019-12-06 DIAGNOSIS — Z794 Long term (current) use of insulin: Secondary | ICD-10-CM | POA: Insufficient documentation

## 2019-12-06 DIAGNOSIS — E109 Type 1 diabetes mellitus without complications: Secondary | ICD-10-CM | POA: Insufficient documentation

## 2019-12-06 LAB — COMPREHENSIVE METABOLIC PANEL
ALT: 12 U/L (ref 0–44)
AST: 16 U/L (ref 15–41)
Albumin: 3.7 g/dL (ref 3.5–5.0)
Alkaline Phosphatase: 77 U/L (ref 38–126)
Anion gap: 10 (ref 5–15)
BUN: 20 mg/dL (ref 8–23)
CO2: 25 mmol/L (ref 22–32)
Calcium: 8.9 mg/dL (ref 8.9–10.3)
Chloride: 105 mmol/L (ref 98–111)
Creatinine, Ser: 0.69 mg/dL (ref 0.44–1.00)
GFR calc Af Amer: >60 mL/min (ref >60)
GFR calc non Af Amer: >60 mL/min (ref >60)
Glucose, Bld: 145 mg/dL — ABNORMAL HIGH (ref 70–99)
Potassium: 3.8 mmol/L (ref 3.5–5.1)
Sodium: 140 mmol/L (ref 135–145)
Total Bilirubin: 0.9 mg/dL (ref 0.3–1.2)
Total Protein: 7.2 g/dL (ref 6.5–8.1)

## 2019-12-06 LAB — URINALYSIS, COMPLETE (UACMP) WITH MICROSCOPIC
Bilirubin Urine: NEGATIVE
Glucose, UA: NEGATIVE mg/dL
Hgb urine dipstick: NEGATIVE
Ketones, ur: NEGATIVE mg/dL
Nitrite: POSITIVE — AB
Protein, ur: NEGATIVE mg/dL
Specific Gravity, Urine: >1.046 — ABNORMAL HIGH (ref 1.005–1.030)
pH: 5 (ref 5.0–8.0)

## 2019-12-06 LAB — CBC
HCT: 36.2 % (ref 36.0–46.0)
Hemoglobin: 12.3 g/dL (ref 12.0–15.0)
MCH: 34.1 pg — ABNORMAL HIGH (ref 26.0–34.0)
MCHC: 34 g/dL (ref 30.0–36.0)
MCV: 100.3 fL — ABNORMAL HIGH (ref 80.0–100.0)
Platelets: 185 10*3/uL (ref 150–400)
RBC: 3.61 MIL/uL — ABNORMAL LOW (ref 3.87–5.11)
RDW: 13.1 % (ref 11.5–15.5)
WBC: 17.3 10*3/uL — ABNORMAL HIGH (ref 4.0–10.5)
nRBC: 0 % (ref 0.0–0.2)

## 2019-12-06 LAB — GLUCOSE, CAPILLARY
Glucose-Capillary: 135 mg/dL — ABNORMAL HIGH (ref 70–99)
Glucose-Capillary: 131 mg/dL — ABNORMAL HIGH (ref 70–99)

## 2019-12-06 MED ORDER — FOSFOMYCIN TROMETHAMINE 3 G PO PACK: 3.0000 g | PACK | Freq: Once | ORAL | 0 refills | Status: AC

## 2019-12-06 MED ORDER — ONDANSETRON HCL 4 MG PO TABS: 4.0000 mg | ORAL_TABLET | Freq: Every day | ORAL | 0 refills | Status: AC | PRN

## 2019-12-06 MED ORDER — IOHEXOL 300 MG/ML  SOLN
100.0000 mL | Freq: Once | INTRAMUSCULAR | Status: AC | PRN
  Administered 2019-12-06: 100 mL via INTRAVENOUS

## 2019-12-06 MED ORDER — FOSFOMYCIN TROMETHAMINE 3 G PO PACK
3.0000 g | PACK | Freq: Once | ORAL | Status: AC
  Administered 2019-12-07: 3 g via ORAL
  Filled 2019-12-06: qty 3

## 2019-12-06 MED ORDER — ONDANSETRON HCL 4 MG/2ML IJ SOLN
4.0000 mg | Freq: Once | INTRAMUSCULAR | Status: AC
  Administered 2019-12-06: 4 mg via INTRAVENOUS
  Filled 2019-12-06: qty 2

## 2019-12-06 MED ORDER — SODIUM CHLORIDE 0.9 % IV BOLUS
1000.0000 mL | Freq: Once | INTRAVENOUS | Status: AC
  Administered 2019-12-06: 1000 mL via INTRAVENOUS

## 2019-12-06 NOTE — ED Triage Notes (Signed)
Patient's daughter states facility had called her and told her that patient had vomited up her breakfast this morning and had not eaten all day today.

## 2019-12-06 NOTE — ED Provider Notes (Addendum)
[facility] Emergency Department Provider Note  Time seen: 8:25 PM  I have reviewed the triage vital signs and the nursing notes.   HISTORY  Chief Complaint Altered Mental Status   HPI Danielle Boyer is a 79 y.o. female with a past medical history of dementia, CAD, diabetes, gastric reflux, hypertension, schizophrenia, presents to the emergency department for nausea vomiting.  According to EMS report patient was noted to have vomited earlier today and has not wanted to eat or drink anything today.  Here the patient has significant dementia is unable to contribute to her history or review of systems.  She does appear comfortable at this time.  No distress in no apparent pain.   Past Medical History:  Diagnosis Date  . Alzheimer disease (HCC)   . Atrial fibrillation (HCC)   . CAD (coronary artery disease)   . Dementia (HCC)   . 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 GERD (gastroesophageal reflux disease)   . Hip fracture (HCC)   . Hypertension   . Hypokalemia   . Hypothyroidism   . Schizophrenia (HCC)   . Thyroid disease    hypo    Patient Active Problem List   Diagnosis Date Noted  . Closed fracture of left olecranon process 02/17/2017  . Sepsis (HCC) 09/01/2016  . Closed left hip fracture (HCC) 02/29/2016  . 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 Essential hypertension 06/07/2015  . Hearing loss 06/07/2015  . Hypothyroidism 06/07/2015  . Cardiovascular disease 06/07/2015  . Atrial fibrillation (HCC) 06/07/2015  . GERD (gastroesophageal reflux disease) 06/07/2015  . IBS (irritable bowel syndrome) 06/07/2015  . Osteoarthritis 06/07/2015  . Paranoid schizophrenia (HCC)   . Schizophrenia (HCC) 06/06/2015  . Diabetes (HCC) 03/19/2015    Past Surgical History:  Procedure Laterality Date  . ABDOMINAL HYSTERECTOMY    . CESAREAN SECTION     x3  . HIP ARTHROPLASTY Left 03/01/2016   Procedure: ARTHROPLASTY BIPOLAR HIP  (HEMIARTHROPLASTY);  Surgeon: Juanell Fairly, MD;  Location: [facility] ORS;  Service: Orthopedics;  Laterality: Left;  . ORIF ELBOW FRACTURE Left 02/17/2017   Procedure: OPEN REDUCTION INTERNAL FIXATION (ORIF) ELBOW/OLECRANON FRACTURE;  Surgeon: Deeann Saint, MD;  Location: [facility] ORS;  Service: Orthopedics;  Laterality: Left;    Prior to Admission medications   Medication Sig Start Date End Date Taking? Authorizing Provider  acetaminophen (TYLENOL) 500 MG tablet Take 500 mg by mouth 3 (three) times daily. Take at 8 am, 2 pm, and 6 pm. *Not to exceed 3 gm/24hr*    [provider]  amLODipine (NORVASC) 10 MG tablet Take 10 mg by mouth daily.    [provider]  aspirin 81 MG chewable tablet Chew 324 mg by mouth daily.    [provider]  atorvastatin (LIPITOR) 40 MG tablet Take 1 tablet (40 mg total) by mouth daily at 6 PM. Patient not taking: Reported on 05/21/2017 06/11/15   Jimmy Footman, MD  bisacodyl (DULCOLAX) 5 MG EC tablet Take 1 tablet (5 mg total) by mouth daily as needed for moderate constipation. Patient not taking: Reported on 05/21/2017 03/03/16   Shaune Pollack, MD  cephALEXin (KEFLEX) 500 MG capsule Take 1 capsule (500 mg total) by mouth 3 (three) times daily. 03/23/18   Irean Hong, MD  ciprofloxacin (CIPRO) 500 MG tablet Take 1 tablet (500 mg total) by mouth 2 (two) times daily. Patient not taking: Reported on 02/17/2017 02/12/17   Jene Every, MD  cyanocobalamin 1000 MCG  tablet Take 1 tablet (1,000 mcg total) by mouth daily. Patient not taking: Reported on 05/21/2017 06/11/15   Jimmy Footman, MD  docusate sodium (COLACE) 100 MG capsule Take 1 capsule (100 mg total) by mouth 2 (two) times daily. 03/03/16   Shaune Pollack, MD  ergocalciferol (VITAMIN D2) 50000 units capsule Take 50,000 Units once a week by mouth.    [provider]  ferrous sulfate 325 (65 FE) MG tablet Take 1 tablet (325 mg total) by mouth 3 (three) times daily after  meals. 03/03/16   Shaune Pollack, MD  HYDROcodone-acetaminophen [facility]) 5-325 MG tablet Take 1 tablet by mouth every 6 (six) hours as needed. 02/18/17   Deeann Saint, MD  insulin detemir (LEVEMIR) 100 UNIT/ML injection Inject 0.2 mLs (20 Units total) into the skin daily. Patient not taking: Reported on 05/21/2017 03/03/16   Shaune Pollack, MD  Insulin Glargine [facility]) Inject 20 Units daily into the skin.    [provider]  levothyroxine (SYNTHROID, LEVOTHROID) 50 MCG tablet Take 1 tablet (50 mcg total) by mouth daily before breakfast. 06/11/15   Jimmy Footman, MD  LORazepam (ATIVAN) 0.5 MG tablet Take 1 tablet (0.5 mg total) by mouth every 6 (six) hours as needed for anxiety. And /or agitation. Patient not taking: Reported on 05/21/2017 09/04/16   Houston Siren, MD  Melatonin 3 MG TABS Take 3 mg by mouth at bedtime.    [provider]  memantine (NAMENDA) 10 MG tablet Take 10 mg by mouth 2 (two) times daily.    [provider]  metFORMIN (GLUCOPHAGE) 500 MG tablet Take 500 mg 2 (two) times daily with a meal by mouth.     [provider]  metoprolol (LOPRESSOR) 50 MG tablet Take 50 mg by mouth 2 (two) times daily.    [provider]  Multiple Vitamin (THEREMS PO) Take 1 tablet daily by mouth.    [provider]  OLANZapine (ZYPREXA) 10 MG tablet Take 1 tablet (10 mg total) by mouth at bedtime. Patient not taking: Reported on 05/21/2017 06/04/15   Clapacs, Jackquline Denmark, MD  omeprazole (PRILOSEC) 20 MG capsule Take 20 mg daily by mouth.    [provider]  sertraline (ZOLOFT) 25 MG tablet Take 12.5 mg daily by mouth.     [provider]  traZODone (DESYREL) 50 MG tablet Take 12.5 mg 2 (two) times daily by mouth.     [provider]    Allergies  Allergen Reactions  . Citalopram Other (See Comments)    Reaction:  Unknown   . Penicillins Rash and Other (See Comments)    Unable to obtain enough  information to answer additional questions about this medication.  Has patient had a PCN reaction causing immediate rash, facial/tongue/throat swelling, SOB or lightheadedness with hypotension: Yes Has patient had a PCN reaction causing severe rash involving mucus membranes or skin necrosis: No Has patient had a PCN reaction that required hospitalization: unknown Has patient had a PCN reaction occurring within the last 10 years: unknown If all of the above answers are "NO", t    Family History  Problem Relation Age of Onset  . Breast cancer Sister     Social History Social History   Tobacco Use  . Smoking status: Never Smoker  . Smokeless tobacco: Never Used  Substance Use Topics  . Alcohol use: No  . Drug use: No    Review of Systems Unable to obtain adequate/accurate review of systems secondary to baseline  dementia.  ____________________________________________   PHYSICAL EXAM:  VITAL SIGNS: ED Triage Vitals  Enc Vitals Group     BP 12/06/19 1541 (!) 113/45     Pulse Rate 12/06/19 1541 76     Resp 12/06/19 1541 16     Temp 12/06/19 1541 99.2 F (37.3 C)     Temp Source 12/06/19 1541 Oral     SpO2 12/06/19 1541 98 %     Weight 12/06/19 1543 150 lb (68 kg)     Height 12/06/19 1543 5\' 5"  (1.651 m)     Head Circumference --      Peak Flow --      Pain Score --      Pain Loc --      Pain Edu? --      Excl. in [place]? --    Constitutional: Patient is awake and alert, no acute distress.  Patient is interactive and will talk with me but due to severe dementia cannot accurately answer questions. Eyes: Normal exam ENT      Head: Normocephalic and atraumatic.      Mouth/Throat: Mucous membranes are moist. Cardiovascular: Normal rate, regular rhythm.  Respiratory: Normal respiratory effort without tachypnea nor retractions. Breath sounds are clear  Gastrointestinal: Soft, appears to have mild reaction to left lower quadrant palpation.  However denies pain, but history of  dementia. Musculoskeletal: Nontender with normal range of motion in all extremities.  Neurologic:  Normal speech and language. No gross focal neurologic deficits  Skin:  Skin is warm, dry and intact.  Psychiatric: Mood and affect are normal.   ____________________________________________   RADIOLOGY  CT is essentially negative for acute abnormality.  ____________________________________________   INITIAL IMPRESSION / ASSESSMENT AND PLAN / ED COURSE  Pertinent labs & imaging results that were available during my care of the patient were reviewed by me and considered in my medical decision making (see chart for details).   Patient presents to the emergency department for vomiting earlier today with decreased appetite.  Patient has significant dementia and cannot contribute to her history or review of systems.  She is talkative and interactive does not appear to be in any acute distress or discomfort.  Does appear to grimace with left lower quadrant palpation.  Patient's labs show a leukocytosis of 17,000.  No other significant findings on work-up thus far.  Urine is pending.  We will obtain a CT scan abdomen/pelvis to further evaluate.  We will IV hydrate while awaiting CT results.  CT negative for acute abnormality.  Patient appears well.  Daughter is here with the patient.  Patient has had no further nausea or vomiting.  States she is feeling better denies any pain.  We will attempt p.o. challenge.  His lungs the patient is able to tolerate p.o. we will discharge home to a nursing facility with nausea medication as needed.  Patient and daughter agreeable.  Urinalysis has resulted concerning for possible urinary tract infection.  We will dose fosfomycin in the emergency department.  I will prescribe an additional dose to be taken in 3 days.  Patient daughter agreeable to plan of care.  Again discussed return precautions.  TOKIKO DIEFENDERFER was evaluated in Emergency Department on 12/06/2019 for the  symptoms described in the history of present illness. She was evaluated in the context of the global COVID-19 pandemic, which necessitated consideration that the patient might be at risk for infection with the SARS-CoV-2 virus that causes COVID-19. Institutional protocols and algorithms that pertain  to the evaluation of patients at risk for COVID-19 are in a state of rapid change based on information released by regulatory bodies including the CDC and federal and state organizations. These policies and algorithms were followed during the patient's care in the ED.  ____________________________________________   FINAL CLINICAL IMPRESSION(S) / ED DIAGNOSES  Nausea vomiting UTI   Minna Antis, MD 12/06/19 5391    Minna Antis, MD 12/06/19 2329

## 2019-12-06 NOTE — ED Triage Notes (Addendum)
PAtient arrived by EMS from [facility]. HX schizophrenia and dementia. Staff reported patient is not acting herself, emesis today, not eating, not as active, reports higher blood sugars than normal. Staff reports patient is normally A&O x3. Patient hard of hearing. Patient oriented to self and place in triage. Disoriented to time and situation. Pleasantly confused

## 2019-12-07 DIAGNOSIS — R112 Nausea with vomiting, unspecified: Secondary | ICD-10-CM | POA: Diagnosis not present

## 2020-11-02 ENCOUNTER — Emergency Department: Payer: Medicare Other

## 2020-11-02 ENCOUNTER — Emergency Department
Admission: EM | Admit: 2020-11-02 | Discharge: 2020-11-02 | Disposition: A | Discharge status: Home / Self Care | Payer: Medicare Other | Attending: Emergency Medicine | Admitting: Emergency Medicine

## 2020-11-02 ENCOUNTER — Other Ambulatory Visit: Payer: Self-pay

## 2020-11-02 DIAGNOSIS — R109 Unspecified abdominal pain: Secondary | ICD-10-CM | POA: Insufficient documentation

## 2020-11-02 DIAGNOSIS — R197 Diarrhea, unspecified: Secondary | ICD-10-CM | POA: Insufficient documentation

## 2020-11-02 DIAGNOSIS — E109 Type 1 diabetes mellitus without complications: Secondary | ICD-10-CM | POA: Insufficient documentation

## 2020-11-02 DIAGNOSIS — F0390 Unspecified dementia without behavioral disturbance: Secondary | ICD-10-CM | POA: Insufficient documentation

## 2020-11-02 DIAGNOSIS — I2510 Atherosclerotic heart disease of native coronary artery without angina pectoris: Secondary | ICD-10-CM | POA: Diagnosis not present

## 2020-11-02 DIAGNOSIS — Z79899 Other long term (current) drug therapy: Secondary | ICD-10-CM | POA: Insufficient documentation

## 2020-11-02 DIAGNOSIS — E039 Hypothyroidism, unspecified: Secondary | ICD-10-CM | POA: Diagnosis not present

## 2020-11-02 DIAGNOSIS — Z96642 Presence of left artificial hip joint: Secondary | ICD-10-CM | POA: Diagnosis not present

## 2020-11-02 DIAGNOSIS — R4182 Altered mental status, unspecified: Secondary | ICD-10-CM | POA: Diagnosis present

## 2020-11-02 DIAGNOSIS — Z7984 Long term (current) use of oral hypoglycemic drugs: Secondary | ICD-10-CM | POA: Insufficient documentation

## 2020-11-02 DIAGNOSIS — R112 Nausea with vomiting, unspecified: Secondary | ICD-10-CM | POA: Diagnosis not present

## 2020-11-02 DIAGNOSIS — Z7982 Long term (current) use of aspirin: Secondary | ICD-10-CM | POA: Diagnosis not present

## 2020-11-02 DIAGNOSIS — Z794 Long term (current) use of insulin: Secondary | ICD-10-CM | POA: Insufficient documentation

## 2020-11-02 DIAGNOSIS — Z20822 Contact with and (suspected) exposure to covid-19: Secondary | ICD-10-CM | POA: Insufficient documentation

## 2020-11-02 DIAGNOSIS — I10 Essential (primary) hypertension: Secondary | ICD-10-CM | POA: Diagnosis not present

## 2020-11-02 LAB — CBC WITH DIFFERENTIAL/PLATELET
Abs Immature Granulocytes: 0.03 10*3/uL (ref 0.00–0.07)
Basophils Absolute: 0 10*3/uL (ref 0.0–0.1)
Basophils Relative: 0 %
Eosinophils Absolute: 0.2 10*3/uL (ref 0.0–0.5)
Eosinophils Relative: 2 %
HCT: 35.2 % — ABNORMAL LOW (ref 36.0–46.0)
Hemoglobin: 11.4 g/dL — ABNORMAL LOW (ref 12.0–15.0)
Immature Granulocytes: 1 %
Lymphocytes Relative: 32 %
Lymphs Abs: 2.1 10*3/uL (ref 0.7–4.0)
MCH: 32.3 pg (ref 26.0–34.0)
MCHC: 32.4 g/dL (ref 30.0–36.0)
MCV: 99.7 fL (ref 80.0–100.0)
Monocytes Absolute: 0.5 10*3/uL (ref 0.1–1.0)
Monocytes Relative: 8 %
Neutro Abs: 3.7 10*3/uL (ref 1.7–7.7)
Neutrophils Relative %: 57 %
Platelets: 190 10*3/uL (ref 150–400)
RBC: 3.53 MIL/uL — ABNORMAL LOW (ref 3.87–5.11)
RDW: 12.5 % (ref 11.5–15.5)
WBC: 6.5 10*3/uL (ref 4.0–10.5)
nRBC: 0 % (ref 0.0–0.2)

## 2020-11-02 LAB — COMPREHENSIVE METABOLIC PANEL
ALT: 12 U/L (ref 0–44)
AST: 13 U/L — ABNORMAL LOW (ref 15–41)
Albumin: 3.1 g/dL — ABNORMAL LOW (ref 3.5–5.0)
Alkaline Phosphatase: 78 U/L (ref 38–126)
Anion gap: 8 (ref 5–15)
BUN: 12 mg/dL (ref 8–23)
CO2: 24 mmol/L (ref 22–32)
Calcium: 8.5 mg/dL — ABNORMAL LOW (ref 8.9–10.3)
Chloride: 106 mmol/L (ref 98–111)
Creatinine, Ser: 0.62 mg/dL (ref 0.44–1.00)
GFR, Estimated: >60 mL/min (ref >60)
Glucose, Bld: 290 mg/dL — ABNORMAL HIGH (ref 70–99)
Potassium: 3.8 mmol/L (ref 3.5–5.1)
Sodium: 138 mmol/L (ref 135–145)
Total Bilirubin: 0.6 mg/dL (ref 0.3–1.2)
Total Protein: 6.1 g/dL — ABNORMAL LOW (ref 6.5–8.1)

## 2020-11-02 LAB — URINALYSIS, COMPLETE (UACMP) WITH MICROSCOPIC
Bilirubin Urine: NEGATIVE
Glucose, UA: >=500 mg/dL — AB
Hgb urine dipstick: NEGATIVE
Ketones, ur: NEGATIVE mg/dL
Nitrite: NEGATIVE
Protein, ur: NEGATIVE mg/dL
Specific Gravity, Urine: 1.003 — ABNORMAL LOW (ref 1.005–1.030)
pH: 5 (ref 5.0–8.0)

## 2020-11-02 LAB — PROTIME-INR
INR: 1 (ref 0.8–1.2)
Prothrombin Time: 13.3 seconds (ref 11.4–15.2)

## 2020-11-02 LAB — RESP PANEL BY RT-PCR (FLU A&B, COVID) ARPGX2
Influenza A by PCR: NEGATIVE
Influenza B by PCR: NEGATIVE
SARS Coronavirus 2 by RT PCR: NEGATIVE

## 2020-11-02 LAB — LACTIC ACID, PLASMA
Lactic Acid, Venous: 2.5 mmol/L (ref 0.5–1.9)
Lactic Acid, Venous: 2 mmol/L (ref 0.5–1.9)

## 2020-11-02 LAB — APTT: aPTT: 29 seconds (ref 24–36)

## 2020-11-02 MED ORDER — LACTATED RINGERS IV BOLUS
1000.0000 mL | Freq: Once | INTRAVENOUS | Status: AC
  Administered 2020-11-02: 1000 mL via INTRAVENOUS

## 2020-11-02 MED ORDER — FOSFOMYCIN TROMETHAMINE 3 G PO PACK
3.0000 g | PACK | Freq: Once | ORAL | Status: AC
  Administered 2020-11-02: 3 g via ORAL
  Filled 2020-11-02: qty 3

## 2020-11-02 MED ORDER — IOHEXOL 300 MG/ML  SOLN
100.0000 mL | Freq: Once | INTRAMUSCULAR | Status: AC | PRN
  Administered 2020-11-02: 100 mL via INTRAVENOUS

## 2020-11-02 NOTE — ED Provider Notes (Signed)
[facility] Emergency Department Provider Note  ____________________________________________   Event Date/Time   First MD Initiated Contact with Patient 11/02/20 1306     (approximate)  I have reviewed the triage vital signs and the nursing notes.   HISTORY  Chief Complaint Altered Mental Status   HPI [name] Danielle Boyer is a 80 y.o. female with a past medical history of Alzheimer's dementia, A. fib, CAD, DM, HTN, hypothyroidism, and schizophrenia who presents via EMS from nursing facility for some increased confusion from baseline starting today as well as report of some nonbloody nonbilious vomiting and diarrhea that started last night.  Patient is unable for any history secondary to being altered on arrival.  No other history is any available impression presentation.         Past Medical History:  Diagnosis Date  . Alzheimer disease (HCC)   . Atrial fibrillation (HCC)   . CAD (coronary artery disease)   . Dementia (HCC)   . 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 GERD (gastroesophageal reflux disease)   . Hip fracture (HCC)   . Hypertension   . Hypokalemia   . Hypothyroidism   . Schizophrenia (HCC)   . Thyroid disease    hypo    Patient Active Problem List   Diagnosis Date Noted  . Closed fracture of left olecranon process 02/17/2017  . Sepsis (HCC) 09/01/2016  . Closed left hip fracture (HCC) 02/29/2016  . 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 Essential hypertension 06/07/2015  . Hearing loss 06/07/2015  . Hypothyroidism 06/07/2015  . Cardiovascular disease 06/07/2015  . Atrial fibrillation (HCC) 06/07/2015  . GERD (gastroesophageal reflux disease) 06/07/2015  . IBS (irritable bowel syndrome) 06/07/2015  . Osteoarthritis 06/07/2015  . Paranoid schizophrenia (HCC)   . Schizophrenia (HCC) 06/06/2015  . Diabetes (HCC) 03/19/2015    Past Surgical History:  Procedure Laterality Date  . ABDOMINAL HYSTERECTOMY    .  CESAREAN SECTION     x3  . HIP ARTHROPLASTY Left 03/01/2016   Procedure: ARTHROPLASTY BIPOLAR HIP (HEMIARTHROPLASTY);  Surgeon: Juanell FairlyKevin Krasinski, MD;  Location: [facility] ORS;  Service: Orthopedics;  Laterality: Left;  . ORIF ELBOW FRACTURE Left 02/17/2017   Procedure: OPEN REDUCTION INTERNAL FIXATION (ORIF) ELBOW/OLECRANON FRACTURE;  Surgeon: Deeann SaintMiller, Howard, MD;  Location: [facility] ORS;  Service: Orthopedics;  Laterality: Left;    Prior to Admission medications   Medication Sig Start Date End Date Taking? Authorizing Provider  acetaminophen (TYLENOL) 500 MG tablet Take 500 mg by mouth 3 (three) times daily. Take at 8 am, 2 pm, and 6 pm. *Not to exceed 3 gm/24hr*    [provider]  amLODipine (NORVASC) 10 MG tablet Take 10 mg by mouth daily.    [provider]  aspirin 81 MG chewable tablet Chew 324 mg by mouth daily.    [provider]  atorvastatin (LIPITOR) 40 MG tablet Take 1 tablet (40 mg total) by mouth daily at 6 PM. Patient not taking: Reported on 05/21/2017 06/11/15   Jimmy FootmanHernandez-Gonzalez, Andrea, MD  bisacodyl (DULCOLAX) 5 MG EC tablet Take 1 tablet (5 mg total) by mouth daily as needed for moderate constipation. Patient not taking: Reported on 05/21/2017 03/03/16   Shaune Pollackhen, Qing, MD  cephALEXin (KEFLEX) 500 MG capsule Take 1 capsule (500 mg total) by mouth 3 (three) times daily. 03/23/18   Irean HongSung, Jade J, MD  ciprofloxacin (CIPRO) 500 MG tablet Take 1 tablet (500 mg total) by mouth 2 (two) times daily. Patient not  taking: Reported on 02/17/2017 02/12/17   Jene Every, MD  cyanocobalamin 1000 MCG tablet Take 1 tablet (1,000 mcg total) by mouth daily. Patient not taking: Reported on 05/21/2017 06/11/15   Jimmy Footman, MD  docusate sodium (COLACE) 100 MG capsule Take 1 capsule (100 mg total) by mouth 2 (two) times daily. 03/03/16   Shaune Pollack, MD  ergocalciferol (VITAMIN D2) 50000 units capsule Take 50,000 Units once a week by mouth.    [provider]   ferrous sulfate 325 (65 FE) MG tablet Take 1 tablet (325 mg total) by mouth 3 (three) times daily after meals. 03/03/16   Shaune Pollack, MD  HYDROcodone-acetaminophen [facility]) 5-325 MG tablet Take 1 tablet by mouth every 6 (six) hours as needed. 02/18/17   Deeann Saint, MD  insulin detemir (LEVEMIR) 100 UNIT/ML injection Inject 0.2 mLs (20 Units total) into the skin daily. Patient not taking: Reported on 05/21/2017 03/03/16   Shaune Pollack, MD  Insulin Glargine [facility]) Inject 20 Units daily into the skin.    [provider]  levothyroxine (SYNTHROID, LEVOTHROID) 50 MCG tablet Take 1 tablet (50 mcg total) by mouth daily before breakfast. 06/11/15   Jimmy Footman, MD  LORazepam (ATIVAN) 0.5 MG tablet Take 1 tablet (0.5 mg total) by mouth every 6 (six) hours as needed for anxiety. And /or agitation. Patient not taking: Reported on 05/21/2017 09/04/16   Houston Siren, MD  Melatonin 3 MG TABS Take 3 mg by mouth at bedtime.    [provider]  memantine (NAMENDA) 10 MG tablet Take 10 mg by mouth 2 (two) times daily.    [provider]  metFORMIN (GLUCOPHAGE) 500 MG tablet Take 500 mg 2 (two) times daily with a meal by mouth.     [provider]  metoprolol (LOPRESSOR) 50 MG tablet Take 50 mg by mouth 2 (two) times daily.    [provider]  Multiple Vitamin (THEREMS PO) Take 1 tablet daily by mouth.    [provider]  OLANZapine (ZYPREXA) 10 MG tablet Take 1 tablet (10 mg total) by mouth at bedtime. Patient not taking: Reported on 05/21/2017 06/04/15   Clapacs, Jackquline Denmark, MD  omeprazole (PRILOSEC) 20 MG capsule Take 20 mg daily by mouth.    [provider]  ondansetron (ZOFRAN) 4 MG tablet Take 1 tablet (4 mg total) by mouth daily as needed for nausea or vomiting. 12/06/19   Minna Antis, MD  sertraline (ZOLOFT) 25 MG tablet Take 12.5 mg daily by mouth.     [provider]  traZODone (DESYREL) 50 MG tablet Take  12.5 mg 2 (two) times daily by mouth.     [provider]    Allergies Citalopram and Penicillins  Family History  Problem Relation Age of Onset  . Breast cancer Sister     Social History Social History   Tobacco Use  . Smoking status: Never Smoker  . Smokeless tobacco: Never Used  Substance Use Topics  . Alcohol use: No  . Drug use: No    Review of Systems  Review of Systems  Unable to perform ROS: Mental status change      ____________________________________________   PHYSICAL EXAM:  VITAL SIGNS: ED Triage Vitals  Enc Vitals Group     BP      Pulse      Resp      Temp      Temp src      SpO2  Weight      Height      Head Circumference      Peak Flow      Pain Score      Pain Loc      Pain Edu?      Excl. in [place]?    Vitals:   11/02/20 1400 11/02/20 1430  BP: 105/72 (!) 119/53  Pulse: 71 67  Resp: 15 (!) 21  Temp:    SpO2: 96% 98%   Physical Exam Vitals and nursing note reviewed.  Constitutional:      General: She is not in acute distress.    Appearance: She is well-developed.  HENT:     Head: Normocephalic and atraumatic.     Right Ear: External ear normal.     Left Ear: External ear normal.     Nose: Nose normal.     Mouth/Throat:     Mouth: Mucous membranes are dry.  Eyes:     Conjunctiva/sclera: Conjunctivae normal.  Cardiovascular:     Rate and Rhythm: Normal rate and regular rhythm.     Heart sounds: No murmur heard.   Pulmonary:     Effort: Pulmonary effort is normal. No respiratory distress.     Breath sounds: Normal breath sounds.  Abdominal:     Palpations: Abdomen is soft.     Tenderness: There is abdominal tenderness.  Musculoskeletal:     Cervical back: Neck supple.  Skin:    General: Skin is warm and dry.     Capillary Refill: Capillary refill takes 2 to 3 seconds.  Neurological:     Mental Status: She is disoriented and confused.     Patient is awake and alert and tracks examiner but does not  respond to direct questions or follow commands.  She withdraws all EXTR to noxious stimuli.  Pupils equally round and symmetric to light. ____________________________________________   LABS (all labs ordered are listed, but only abnormal results are displayed)  Labs Reviewed  CULTURE, BLOOD (SINGLE)  URINE CULTURE  RESP PANEL BY RT-PCR (FLU A&B, COVID) ARPGX2  GASTROINTESTINAL PANEL BY PCR, STOOL (REPLACES STOOL CULTURE)  C DIFFICILE QUICK SCREEN W PCR REFLEX  LACTIC ACID, PLASMA  LACTIC ACID, PLASMA  COMPREHENSIVE METABOLIC PANEL  CBC WITH DIFFERENTIAL/PLATELET  PROTIME-INR  APTT  URINALYSIS, COMPLETE (UACMP) WITH MICROSCOPIC   ____________________________________________  EKG  Sinus rhythm with a ventricular rate of 73, normal axis, unremarkable intervals without clearance of acute ischemia or significant underlying arrhythmia. ____________________________________________  RADIOLOGY  ED MD interpretation: Chest x-ray has no clear for consolidation constipation, significant edema or other clear acute intrathoracic process.  Official radiology report(s): DG Chest Portable 1 View  Result Date: 11/02/2020 CLINICAL DATA:  Nausea and vomiting, altered mental status EXAM: PORTABLE CHEST 1 VIEW COMPARISON:  November 2018 FINDINGS: Low lung volumes. No new consolidation or edema. A basilar atelectasis/scarring. No pleural effusion or pneumothorax. IMPRESSION: No acute process in the chest. Electronically Signed   By: Guadlupe Spanish M.D.   On: 11/02/2020 13:39    ____________________________________________   PROCEDURES  Procedure(s) performed (including Critical Care):  .1-3 Lead EKG Interpretation Performed by: Gilles Chiquito, MD Authorized by: Gilles Chiquito, MD     Interpretation: normal     ECG rate assessment: normal     Rhythm: sinus rhythm     Ectopy: none     Conduction: normal       ____________________________________________   INITIAL IMPRESSION /  ASSESSMENT AND PLAN / ED COURSE  Patient presents with above to history exam for assessment of some increased confusion and altered mental status from baseline in the setting of about 24 hours of nausea and vomiting diarrhea.  On exam she does not follow commands but appears awake and does not provide any history or answer questions directly.  She does appear tender in her abdomen.  Chest appears little bit dry.  Differential includes SAH, acute metabolic derangements, dehydration, acute infectious process and endocrine derangements.  No history or exam features to suggest acute recent trauma and Evalose patient for toxic ingestion.  ECG shows no clear arrhythmia and chest x-ray shows evidence of pneumonia or acute volume overload..  Will plan to obtain CT head and CT abdomen as well as labs including CBC, CMP, lactic, urinalysis, C. difficile and GI pathogen panel given reports of diarrhea.  We will also obtain COVID and flu.  Care patient signed over to oncoming rider approximately 1500.  Plan is to follow-up these imaging and lab studies and reassess.     ____________________________________________   FINAL CLINICAL IMPRESSION(S) / ED DIAGNOSES  Final diagnoses:  Altered mental status, unspecified altered mental status type    Medications - No data to display   ED Discharge Orders    None       Note:  This document was prepared using Dragon voice recognition software and may include unintentional dictation errors.   Gilles Chiquito, MD 11/02/20 1536

## 2020-11-02 NOTE — ED Notes (Signed)
Pts family provided with discharge by Ronnald Collum, RN as well as signature of discharge obtained. Pt transported by EMS at this time with belongings in bag. Ronnald Collum, RN provided report back to SNF.

## 2020-11-02 NOTE — ED Notes (Signed)
2 sets of blood cultures, red top, light green top, dark green top, blue top, lavender top, and gray top sent down at this time.

## 2020-11-02 NOTE — ED Notes (Signed)
Green top resent to lab at this time. 

## 2020-11-02 NOTE — ED Notes (Addendum)
Attempted to straight cath/ place purewick, pt combative and uncooperative. Will inform MD.

## 2020-11-02 NOTE — ED Provider Notes (Signed)
-----------------------------------------   7:48 PM on 11/02/2020 -----------------------------------------  Patient care assumed from Dr. Katrinka Blazing.  Currently the patient appears well.  Son is in the room with the patient.  Given her reassuring work-up I believe the patient would be safe for discharge back to her nursing facility.  Patient's urinalysis does show rare bacteria we will treat with a one-time dose of fosfomycin in the emergency department.  Lab work largely nonrevealing otherwise.  CT scans negative.  Chest x-ray negative.  Son agreeable to plan of care.   Minna Antis, MD 11/02/20 1949

## 2020-11-02 NOTE — ED Triage Notes (Signed)
Per EMS: Staff reports pt is "more altered than usual" and weak, and roommate states pt had N/V/D last night. Pt is alert, confused x4. Pt has been treated for a UTI x 5 days.

## 2020-11-03 ENCOUNTER — Other Ambulatory Visit: Payer: Self-pay

## 2020-11-03 ENCOUNTER — Emergency Department
Admission: EM | Admit: 2020-11-03 | Discharge: 2020-11-04 | Disposition: A | Discharge status: Home / Self Care | Payer: Medicare Other | Attending: Emergency Medicine | Admitting: Emergency Medicine

## 2020-11-03 ENCOUNTER — Emergency Department: Payer: Medicare Other

## 2020-11-03 ENCOUNTER — Encounter: Payer: Self-pay | Admitting: Emergency Medicine

## 2020-11-03 DIAGNOSIS — R4182 Altered mental status, unspecified: Secondary | ICD-10-CM | POA: Diagnosis present

## 2020-11-03 DIAGNOSIS — E039 Hypothyroidism, unspecified: Secondary | ICD-10-CM | POA: Diagnosis not present

## 2020-11-03 DIAGNOSIS — I2510 Atherosclerotic heart disease of native coronary artery without angina pectoris: Secondary | ICD-10-CM | POA: Insufficient documentation

## 2020-11-03 DIAGNOSIS — I10 Essential (primary) hypertension: Secondary | ICD-10-CM | POA: Diagnosis not present

## 2020-11-03 DIAGNOSIS — Z96642 Presence of left artificial hip joint: Secondary | ICD-10-CM | POA: Insufficient documentation

## 2020-11-03 DIAGNOSIS — R739 Hyperglycemia, unspecified: Secondary | ICD-10-CM

## 2020-11-03 DIAGNOSIS — Z7982 Long term (current) use of aspirin: Secondary | ICD-10-CM | POA: Insufficient documentation

## 2020-11-03 DIAGNOSIS — Z8659 Personal history of other mental and behavioral disorders: Secondary | ICD-10-CM | POA: Diagnosis not present

## 2020-11-03 DIAGNOSIS — Z79899 Other long term (current) drug therapy: Secondary | ICD-10-CM | POA: Insufficient documentation

## 2020-11-03 DIAGNOSIS — Z7984 Long term (current) use of oral hypoglycemic drugs: Secondary | ICD-10-CM | POA: Insufficient documentation

## 2020-11-03 DIAGNOSIS — F0390 Unspecified dementia without behavioral disturbance: Secondary | ICD-10-CM | POA: Diagnosis not present

## 2020-11-03 DIAGNOSIS — Z794 Long term (current) use of insulin: Secondary | ICD-10-CM | POA: Diagnosis not present

## 2020-11-03 DIAGNOSIS — E1165 Type 2 diabetes mellitus with hyperglycemia: Secondary | ICD-10-CM | POA: Insufficient documentation

## 2020-11-03 LAB — CBG MONITORING, ED
Glucose-Capillary: 311 mg/dL — ABNORMAL HIGH (ref 70–99)
Glucose-Capillary: 292 mg/dL — ABNORMAL HIGH (ref 70–99)

## 2020-11-03 LAB — COMPREHENSIVE METABOLIC PANEL
ALT: 14 U/L (ref 0–44)
AST: 17 U/L (ref 15–41)
Albumin: 3.7 g/dL (ref 3.5–5.0)
Alkaline Phosphatase: 98 U/L (ref 38–126)
Anion gap: 9 (ref 5–15)
BUN: 15 mg/dL (ref 8–23)
CO2: 27 mmol/L (ref 22–32)
Calcium: 9.3 mg/dL (ref 8.9–10.3)
Chloride: 102 mmol/L (ref 98–111)
Creatinine, Ser: 0.74 mg/dL (ref 0.44–1.00)
GFR, Estimated: >60 mL/min (ref >60)
Glucose, Bld: 363 mg/dL — ABNORMAL HIGH (ref 70–99)
Potassium: 4.5 mmol/L (ref 3.5–5.1)
Sodium: 138 mmol/L (ref 135–145)
Total Bilirubin: 0.7 mg/dL (ref 0.3–1.2)
Total Protein: 7.4 g/dL (ref 6.5–8.1)

## 2020-11-03 LAB — CBC WITH DIFFERENTIAL/PLATELET
Abs Immature Granulocytes: 0.04 10*3/uL (ref 0.00–0.07)
Basophils Absolute: 0 10*3/uL (ref 0.0–0.1)
Basophils Relative: 0 %
Eosinophils Absolute: 0.2 10*3/uL (ref 0.0–0.5)
Eosinophils Relative: 2 %
HCT: 38.5 % (ref 36.0–46.0)
Hemoglobin: 12.8 g/dL (ref 12.0–15.0)
Immature Granulocytes: 1 %
Lymphocytes Relative: 20 %
Lymphs Abs: 1.7 10*3/uL (ref 0.7–4.0)
MCH: 32.9 pg (ref 26.0–34.0)
MCHC: 33.2 g/dL (ref 30.0–36.0)
MCV: 99 fL (ref 80.0–100.0)
Monocytes Absolute: 0.5 10*3/uL (ref 0.1–1.0)
Monocytes Relative: 6 %
Neutro Abs: 5.9 10*3/uL (ref 1.7–7.7)
Neutrophils Relative %: 71 %
Platelets: 190 10*3/uL (ref 150–400)
RBC: 3.89 MIL/uL (ref 3.87–5.11)
RDW: 12.8 % (ref 11.5–15.5)
WBC: 8.3 10*3/uL (ref 4.0–10.5)
nRBC: 0 % (ref 0.0–0.2)

## 2020-11-03 LAB — TROPONIN I (HIGH SENSITIVITY)
Troponin I (High Sensitivity): 4 ng/L (ref <18)
Troponin I (High Sensitivity): 4 ng/L (ref <18)

## 2020-11-03 LAB — TSH: TSH: 3.443 u[IU]/mL (ref 0.350–4.500)

## 2020-11-03 LAB — T4, FREE: Free T4: 0.89 ng/dL (ref 0.61–1.12)

## 2020-11-03 MED ORDER — SODIUM CHLORIDE 0.9 % IV BOLUS
1000.0000 mL | Freq: Once | INTRAVENOUS | Status: AC
  Administered 2020-11-03: 1000 mL via INTRAVENOUS

## 2020-11-03 NOTE — ED Triage Notes (Signed)
Pt via EMS from [facility]. Pt was seen and d/c yesterday. Per EMS, pt is already dx with UTI and has been treated with antibiotics. Family wanted pt sent out because the pt is still altered. Pt does have a hx of dementia. Pt has no complaints. Pt is alert and oriented to self only.

## 2020-11-03 NOTE — ED Notes (Signed)
Pt given diet ginger ale for PO challenge. Resting and appears comfortable in bed, son remains at bedside. Stretcher locked in low position with side rails up x2, call light in reach.

## 2020-11-03 NOTE — Discharge Instructions (Addendum)
Her work-up was reassuring.  I did an MRI to make sure there is no evidence of stroke and all of her labs were normal except for her elevated sugar which got better after fluids.  We do not like to lower the sugars too fast in the emergency room due to the risk of low sugars therefore she will just need to talk with her primary care doctor to adjust her insulin.  She may just need to be on a larger nighttime insulin.  Son was at bedside who stated that she seemed to be acting at her baseline self therefore patient be discharged back to your facility

## 2020-11-03 NOTE — ED Notes (Signed)
Attempted to obtain urine sample with in and out cath. Patient confused, as she has been for duration of her ED visit, and refuses to let nursing staff perform cath. Patient clamped legs closed, yelling "leave me alone". Will notify MD. Patient's son remains at bedside, stretcher in low position with wheels locked, call light in reach, side rails up x2.

## 2020-11-03 NOTE — ED Notes (Addendum)
Report attempted to [facility] with no answer.

## 2020-11-03 NOTE — ED Notes (Signed)
[facility] to transport to [facility]

## 2020-11-03 NOTE — ED Provider Notes (Signed)
[facility] Emergency Department Provider Note  ____________________________________________   Event Date/Time   First MD Initiated Contact with Patient 11/03/20 1644     (approximate)  I have reviewed the triage vital signs and the nursing notes.   HISTORY  Chief Complaint Altered Mental Status    HPI Danielle Boyer is a 80 y.o. female who comes in from EMS from [facility].  Patient has a history of dementia  On review of records patient was seen yesterday with a reassuring work-up and urine showed rare bacteria so patient was treated with a one-time dose of fosfomycin in the emergency department.  According to EMS patient was transported here due to concerns from the family that patient was still more confused than normal.  However the son showed up in the room and he stated that it was because the facility thought she was more confused than normal not him.  I did call the facility who stated that she had a vomiting episode yesterday and some chest pain yesterday but had already had a reassuring work-up from that.  However today they noted the sugar was really elevated at 444 and that she appeared "drugged up" so they wanted her reevaluated.  Unable to get full HPI from patient due to dementia   Past Medical History:  Diagnosis Date  . Alzheimer disease (HCC)   . Atrial fibrillation (HCC)   . CAD (coronary artery disease)   . Dementia (HCC)   . 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 GERD (gastroesophageal reflux disease)   . Hip fracture (HCC)   . Hypertension   . Hypokalemia   . Hypothyroidism   . Schizophrenia (HCC)   . Thyroid disease    hypo    Patient Active Problem List   Diagnosis Date Noted  . Closed fracture of left olecranon process 02/17/2017  . Sepsis (HCC) 09/01/2016  . Closed left hip fracture (HCC) 02/29/2016  . 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 Essential hypertension 06/07/2015  . Hearing loss  06/07/2015  . Hypothyroidism 06/07/2015  . Cardiovascular disease 06/07/2015  . Atrial fibrillation (HCC) 06/07/2015  . GERD (gastroesophageal reflux disease) 06/07/2015  . IBS (irritable bowel syndrome) 06/07/2015  . Osteoarthritis 06/07/2015  . Paranoid schizophrenia (HCC)   . Schizophrenia (HCC) 06/06/2015  . Diabetes (HCC) 03/19/2015    Past Surgical History:  Procedure Laterality Date  . ABDOMINAL HYSTERECTOMY    . CESAREAN SECTION     x3  . HIP ARTHROPLASTY Left 03/01/2016   Procedure: ARTHROPLASTY BIPOLAR HIP (HEMIARTHROPLASTY);  Surgeon: Juanell Fairly, MD;  Location: [facility] ORS;  Service: Orthopedics;  Laterality: Left;  . ORIF ELBOW FRACTURE Left 02/17/2017   Procedure: OPEN REDUCTION INTERNAL FIXATION (ORIF) ELBOW/OLECRANON FRACTURE;  Surgeon: Deeann Saint, MD;  Location: [facility] ORS;  Service: Orthopedics;  Laterality: Left;    Prior to Admission medications   Medication Sig Start Date End Date Taking? Authorizing Provider  acetaminophen (TYLENOL) 500 MG tablet Take 500 mg by mouth 3 (three) times daily. Take at 8 am, 2 pm, and 6 pm. *Not to exceed 3 gm/24hr*    [provider]  amLODipine (NORVASC) 10 MG tablet Take 10 mg by mouth daily.    [provider]  aspirin 81 MG chewable tablet Chew 324 mg by mouth daily.    [provider]  atorvastatin (LIPITOR) 40 MG tablet Take 1 tablet (40 mg total) by mouth daily at 6 PM. Patient not taking: Reported  on 05/21/2017 06/11/15   Jimmy Footman, MD  bisacodyl (DULCOLAX) 5 MG EC tablet Take 1 tablet (5 mg total) by mouth daily as needed for moderate constipation. Patient not taking: Reported on 05/21/2017 03/03/16   Shaune Pollack, MD  cephALEXin (KEFLEX) 500 MG capsule Take 1 capsule (500 mg total) by mouth 3 (three) times daily. 03/23/18   Irean Hong, MD  ciprofloxacin (CIPRO) 500 MG tablet Take 1 tablet (500 mg total) by mouth 2 (two) times daily. Patient not taking: Reported on 02/17/2017  02/12/17   Jene Every, MD  cyanocobalamin 1000 MCG tablet Take 1 tablet (1,000 mcg total) by mouth daily. Patient not taking: Reported on 05/21/2017 06/11/15   Jimmy Footman, MD  docusate sodium (COLACE) 100 MG capsule Take 1 capsule (100 mg total) by mouth 2 (two) times daily. 03/03/16   Shaune Pollack, MD  ergocalciferol (VITAMIN D2) 50000 units capsule Take 50,000 Units once a week by mouth.    [provider]  ferrous sulfate 325 (65 FE) MG tablet Take 1 tablet (325 mg total) by mouth 3 (three) times daily after meals. 03/03/16   Shaune Pollack, MD  HYDROcodone-acetaminophen [facility]) 5-325 MG tablet Take 1 tablet by mouth every 6 (six) hours as needed. 02/18/17   Deeann Saint, MD  insulin detemir (LEVEMIR) 100 UNIT/ML injection Inject 0.2 mLs (20 Units total) into the skin daily. Patient not taking: Reported on 05/21/2017 03/03/16   Shaune Pollack, MD  Insulin Glargine [facility]) Inject 20 Units daily into the skin.    [provider]  levothyroxine (SYNTHROID, LEVOTHROID) 50 MCG tablet Take 1 tablet (50 mcg total) by mouth daily before breakfast. 06/11/15   Jimmy Footman, MD  LORazepam (ATIVAN) 0.5 MG tablet Take 1 tablet (0.5 mg total) by mouth every 6 (six) hours as needed for anxiety. And /or agitation. Patient not taking: Reported on 05/21/2017 09/04/16   Houston Siren, MD  Melatonin 3 MG TABS Take 3 mg by mouth at bedtime.    [provider]  memantine (NAMENDA) 10 MG tablet Take 10 mg by mouth 2 (two) times daily.    [provider]  metFORMIN (GLUCOPHAGE) 500 MG tablet Take 500 mg 2 (two) times daily with a meal by mouth.     [provider]  metoprolol (LOPRESSOR) 50 MG tablet Take 50 mg by mouth 2 (two) times daily.    [provider]  Multiple Vitamin (THEREMS PO) Take 1 tablet daily by mouth.    [provider]  OLANZapine (ZYPREXA) 10 MG tablet Take 1 tablet (10 mg total) by mouth at  bedtime. Patient not taking: Reported on 05/21/2017 06/04/15   Clapacs, Jackquline Denmark, MD  omeprazole (PRILOSEC) 20 MG capsule Take 20 mg daily by mouth.    [provider]  ondansetron (ZOFRAN) 4 MG tablet Take 1 tablet (4 mg total) by mouth daily as needed for nausea or vomiting. 12/06/19   Minna Antis, MD  sertraline (ZOLOFT) 25 MG tablet Take 12.5 mg daily by mouth.     [provider]  traZODone (DESYREL) 50 MG tablet Take 12.5 mg 2 (two) times daily by mouth.     [provider]    Allergies Citalopram and Penicillins  Family History  Problem Relation Age of Onset  . Breast cancer Sister     Social History Social History   Tobacco Use  . Smoking status: Never Smoker  . Smokeless tobacco: Never Used  Substance Use Topics  . Alcohol  use: No  . Drug use: No      Review of Systems Unable to get full ROS from patient due to dementia ____________________________________________   PHYSICAL EXAM:  VITAL SIGNS: ED Triage Vitals [11/03/20 1641]  Enc Vitals Group     BP (!) 124/54     Pulse Rate 62     Resp 18     Temp 97.7 F (36.5 C)     Temp Source Oral     SpO2 97 %     Weight 130 lb (59 kg)     Height 5\' 5"  (1.651 m)     Head Circumference      Peak Flow      Pain Score      Pain Loc      Pain Edu?      Excl. in GC?     Constitutional: Alert and oriented x1. Well appearing and in no acute distress.  Moving all extremities Eyes: Conjunctivae are normal. EOMI. Head: Atraumatic. Nose: No congestion/rhinnorhea. Mouth/Throat: Mucous membranes are moist.   Neck: No stridor. Trachea Midline. FROM Cardiovascular: Normal rate, regular rhythm. Grossly normal heart sounds.  Good peripheral circulation. Respiratory: Normal respiratory effort.  No retractions. Lungs CTAB. Gastrointestinal: Soft and nontender. No distention. No abdominal bruits.  Musculoskeletal: No lower extremity tenderness nor edema.  No joint effusions. Neurologic:   Normal speech and language. No gross focal neurologic deficits are appreciated.  Patient does appear confused but has baseline dementia Skin:  Skin is warm, dry and intact. No rash noted. Psychiatric: Mood and affect are normal. Speech and behavior are normal. GU: Deferred   ____________________________________________   LABS (all labs ordered are listed, but only abnormal results are displayed)  Labs Reviewed  COMPREHENSIVE METABOLIC PANEL - Abnormal; Notable for the following components:      Result Value   Glucose, Bld 363 (*)    All other components within normal limits  CBG MONITORING, ED - Abnormal; Notable for the following components:   Glucose-Capillary 311 (*)    All other components within normal limits  CBG MONITORING, ED - Abnormal; Notable for the following components:   Glucose-Capillary 292 (*)    All other components within normal limits  CBC WITH DIFFERENTIAL/PLATELET  TSH  T4, FREE  URINALYSIS, COMPLETE (UACMP) WITH MICROSCOPIC  TROPONIN I (HIGH SENSITIVITY)  TROPONIN I (HIGH SENSITIVITY)   ____________________________________________   ED ECG REPORT I, , the attending physician, personally viewed and interpreted this ECG.  Normal sinus rate of 70, no ST elevation, no T wave inversions, normal intervals ____________________________________________  RADIOLOGY   Official radiology report(s): CT Head Wo Contrast  Result Date: 11/02/2020 CLINICAL DATA:  Mental status change EXAM: CT HEAD WITHOUT CONTRAST TECHNIQUE: Contiguous axial images were obtained from the base of the skull through the vertex without intravenous contrast. COMPARISON:  August 2018 FINDINGS: Brain: There is no acute intracranial hemorrhage, mass effect, or edema. Gray-white differentiation is preserved. There is no extra-axial fluid collection. Prominence of the ventricles and sulci reflects generalized parenchymal volume loss. There is disproportionate temporal volume loss.  Patchy hypoattenuation in the supratentorial white matter is nonspecific but probably reflects mild to moderate chronic microvascular ischemic changes. Vascular: There is atherosclerotic calcification at the skull base. Skull: Calvarium is unremarkable. Sinuses/Orbits: Partially imaged right maxillary sinus is nearly completely opacified. Bilateral lens replacements. Other: None. IMPRESSION: No acute intracranial hemorrhage or evidence of acute infarction. Partially imaged nonspecific right maxillary sinus inflammatory changes. Chronic microvascular ischemic changes.  Electronically Signed   By: Guadlupe Spanish M.D.   On: 11/02/2020 17:46   CT ABDOMEN PELVIS W CONTRAST  Result Date: 11/02/2020 CLINICAL DATA:  Altered level of consciousness, weakness, nausea/vomiting/diarrhea, urinary tract infection EXAM: CT ABDOMEN AND PELVIS WITH CONTRAST TECHNIQUE: Multidetector CT imaging of the abdomen and pelvis was performed using the standard protocol following bolus administration of intravenous contrast. CONTRAST:  OMNIPAQUE IOHEXOL 300 MG/ML  SOLN COMPARISON:  12/06/2019 FINDINGS: Lower chest: No acute pleural or parenchymal lung disease. Hepatobiliary: No focal liver abnormality is seen. No gallstones, gallbladder wall thickening, or biliary dilatation. Pancreas: Unremarkable. No pancreatic ductal dilatation or surrounding inflammatory changes. Spleen: Normal in size without focal abnormality. Adrenals/Urinary Tract: Stable indeterminate 14 mm left adrenal nodule. Right adrenals unremarkable. Stable bilateral kidneys. No urinary tract calculi or obstruction. The bladder is unremarkable. Stomach/Bowel: No bowel obstruction or ileus. Diverticulosis of the sigmoid colon without diverticulitis. No bowel wall thickening or inflammatory change. Vascular/Lymphatic: Aortic atherosclerosis. No enlarged abdominal or pelvic lymph nodes. Reproductive: Status post hysterectomy. No adnexal masses. Other: No free fluid or free  gas.  No abdominal wall hernia. Musculoskeletal: Postsurgical changes from left hip arthroplasty. There are no acute or destructive bony lesions. Reconstructed images demonstrate no additional findings. IMPRESSION: 1. No acute intra-abdominal or intrapelvic process. 2. Sigmoid diverticulosis without diverticulitis. 3. Stable indeterminate left adrenal nodule likely adenoma. 4.  Aortic Atherosclerosis (ICD10-I70.0). Electronically Signed   By: Sharlet Salina M.D.   On: 11/02/2020 17:47    ____________________________________________   PROCEDURES  Procedure(s) performed (including Critical Care):  Procedures   ____________________________________________   INITIAL IMPRESSION / ASSESSMENT AND PLAN / ED COURSE  Danielle Boyer was evaluated in Emergency Department on 11/03/2020 for the symptoms described in the history of present illness. She was evaluated in the context of the global COVID-19 pandemic, which necessitated consideration that the patient might be at risk for infection with the SARS-CoV-2 virus that causes COVID-19. Institutional protocols and algorithms that pertain to the evaluation of patients at risk for COVID-19 are in a state of rapid change based on information released by regulatory bodies including the CDC and federal and state organizations. These policies and algorithms were followed during the patient's care in the ED.     Patient is a well-appearing 80 year old who comes in for concerns for change in mental status.  Patient was treated for UTI yesterday with fosfomycin.  She had moderate leuks and only 11-20 WBCs.  Not super convincing for UTI but did get treatment cultures already pending.  Patient was afebrile with normal white count so it seems less likely to be what is causing her mental status change.  I did order an MRI to make sure no evidence of stroke.  CT head that yesterday negative for intercranial hemorrhage.  I check thyroid levels and look for evidence of DKA.   These were all reassuring.  I did give a liter of fluid for patient's hyperglycemia and her glucoses are trending down.  I do not think there is utility in rechecking her urine today given patient's already had treatment and does not appear septic.  Patient does have baseline dementia is moving all around and it would be very hard to get a catheterization.  She already had a CT abdomen and a chest x-ray yesterday that were negative.  She also had a negative COVID and flu test.  Son is in the room who states that he feels that patient is at baseline self.  At  this time given reassuring work-up will discharge patient and have him follow-up with the PCP for her elevated sugars for adjustment of her medications.      ____________________________________________   FINAL CLINICAL IMPRESSION(S) / ED DIAGNOSES   Final diagnoses:  Dementia without behavioral disturbance, unspecified dementia type (HCC)  Hyperglycemia      MEDICATIONS GIVEN DURING THIS VISIT:  Medications  sodium chloride 0.9 % bolus 1,000 mL (1,000 mLs Intravenous New Bag/Given 11/03/20 2025)     ED Discharge Orders    None       Note:  This document was prepared using Dragon voice recognition software and may include unintentional dictation errors.   Concha SeFunke, Airlie Blumenberg E, MD 11/03/20 [phone]

## 2020-11-04 LAB — URINE CULTURE: Culture: <10000 — AB

## 2020-11-04 NOTE — ED Notes (Signed)
Report attempted x4 with no answer or extension that lead to answered phone call.

## 2020-11-07 LAB — CULTURE, BLOOD (SINGLE)
Culture: NO GROWTH
Special Requests: ADEQUATE

## 2021-02-06 ENCOUNTER — Other Ambulatory Visit: Payer: Self-pay

## 2021-02-06 ENCOUNTER — Emergency Department

## 2021-02-06 ENCOUNTER — Emergency Department
Admission: EM | Admit: 2021-02-06 | Discharge: 2021-02-07 | Disposition: A | Discharge status: Home / Self Care | Attending: Emergency Medicine | Admitting: Emergency Medicine

## 2021-02-06 DIAGNOSIS — Z96642 Presence of left artificial hip joint: Secondary | ICD-10-CM | POA: Diagnosis not present

## 2021-02-06 DIAGNOSIS — Z79899 Other long term (current) drug therapy: Secondary | ICD-10-CM | POA: Diagnosis not present

## 2021-02-06 DIAGNOSIS — G309 Alzheimer's disease, unspecified: Secondary | ICD-10-CM | POA: Insufficient documentation

## 2021-02-06 DIAGNOSIS — R079 Chest pain, unspecified: Secondary | ICD-10-CM

## 2021-02-06 DIAGNOSIS — N390 Urinary tract infection, site not specified: Secondary | ICD-10-CM | POA: Diagnosis not present

## 2021-02-06 DIAGNOSIS — I2510 Atherosclerotic heart disease of native coronary artery without angina pectoris: Secondary | ICD-10-CM | POA: Insufficient documentation

## 2021-02-06 DIAGNOSIS — E039 Hypothyroidism, unspecified: Secondary | ICD-10-CM | POA: Diagnosis not present

## 2021-02-06 DIAGNOSIS — Z7982 Long term (current) use of aspirin: Secondary | ICD-10-CM | POA: Insufficient documentation

## 2021-02-06 DIAGNOSIS — B9689 Other specified bacterial agents as the cause of diseases classified elsewhere: Secondary | ICD-10-CM | POA: Diagnosis not present

## 2021-02-06 DIAGNOSIS — Z7984 Long term (current) use of oral hypoglycemic drugs: Secondary | ICD-10-CM | POA: Insufficient documentation

## 2021-02-06 DIAGNOSIS — I10 Essential (primary) hypertension: Secondary | ICD-10-CM | POA: Diagnosis not present

## 2021-02-06 DIAGNOSIS — F0281 Dementia in other diseases classified elsewhere with behavioral disturbance: Secondary | ICD-10-CM | POA: Insufficient documentation

## 2021-02-06 DIAGNOSIS — Z794 Long term (current) use of insulin: Secondary | ICD-10-CM | POA: Insufficient documentation

## 2021-02-06 DIAGNOSIS — R0781 Pleurodynia: Secondary | ICD-10-CM | POA: Insufficient documentation

## 2021-02-06 LAB — CBC WITH DIFFERENTIAL/PLATELET
Abs Immature Granulocytes: 0.02 10*3/uL (ref 0.00–0.07)
Basophils Absolute: 0 10*3/uL (ref 0.0–0.1)
Basophils Relative: 0 %
Eosinophils Absolute: 0.3 10*3/uL (ref 0.0–0.5)
Eosinophils Relative: 4 %
HCT: 34.6 % — ABNORMAL LOW (ref 36.0–46.0)
Hemoglobin: 12 g/dL (ref 12.0–15.0)
Immature Granulocytes: 0 %
Lymphocytes Relative: 31 %
Lymphs Abs: 2.3 10*3/uL (ref 0.7–4.0)
MCH: 34.1 pg — ABNORMAL HIGH (ref 26.0–34.0)
MCHC: 34.7 g/dL (ref 30.0–36.0)
MCV: 98.3 fL (ref 80.0–100.0)
Monocytes Absolute: 0.4 10*3/uL (ref 0.1–1.0)
Monocytes Relative: 6 %
Neutro Abs: 4.4 10*3/uL (ref 1.7–7.7)
Neutrophils Relative %: 59 %
Platelets: 185 10*3/uL (ref 150–400)
RBC: 3.52 MIL/uL — ABNORMAL LOW (ref 3.87–5.11)
RDW: 13.3 % (ref 11.5–15.5)
WBC: 7.5 10*3/uL (ref 4.0–10.5)
nRBC: 0 % (ref 0.0–0.2)

## 2021-02-06 LAB — HEPATIC FUNCTION PANEL
ALT: 19 U/L (ref 0–44)
AST: 19 U/L (ref 15–41)
Albumin: 3.6 g/dL (ref 3.5–5.0)
Alkaline Phosphatase: 68 U/L (ref 38–126)
Bilirubin, Direct: 0.1 mg/dL (ref 0.0–0.2)
Indirect Bilirubin: 0.6 mg/dL (ref 0.3–0.9)
Total Bilirubin: 0.7 mg/dL (ref 0.3–1.2)
Total Protein: 6.6 g/dL (ref 6.5–8.1)

## 2021-02-06 LAB — URINALYSIS, COMPLETE (UACMP) WITH MICROSCOPIC
Bilirubin Urine: NEGATIVE
Glucose, UA: 150 mg/dL — AB
Ketones, ur: NEGATIVE mg/dL
Nitrite: POSITIVE — AB
Protein, ur: 100 mg/dL — AB
Specific Gravity, Urine: 1.016 (ref 1.005–1.030)
Squamous Epithelial / HPF: NONE SEEN (ref 0–5)
WBC, UA: >50 WBC/hpf — ABNORMAL HIGH (ref 0–5)
pH: 8 (ref 5.0–8.0)

## 2021-02-06 LAB — CBC
HCT: 35.3 % — ABNORMAL LOW (ref 36.0–46.0)
Hemoglobin: 12 g/dL (ref 12.0–15.0)
MCH: 33.6 pg (ref 26.0–34.0)
MCHC: 34 g/dL (ref 30.0–36.0)
MCV: 98.9 fL (ref 80.0–100.0)
Platelets: 181 10*3/uL (ref 150–400)
RBC: 3.57 MIL/uL — ABNORMAL LOW (ref 3.87–5.11)
RDW: 13.3 % (ref 11.5–15.5)
WBC: 7.4 10*3/uL (ref 4.0–10.5)
nRBC: 0 % (ref 0.0–0.2)

## 2021-02-06 LAB — TROPONIN I (HIGH SENSITIVITY)
Troponin I (High Sensitivity): 5 ng/L (ref <18)
Troponin I (High Sensitivity): 6 ng/L (ref <18)
Troponin I (High Sensitivity): 6 ng/L (ref <18)

## 2021-02-06 LAB — BASIC METABOLIC PANEL
Anion gap: 9 (ref 5–15)
BUN: 19 mg/dL (ref 8–23)
CO2: 25 mmol/L (ref 22–32)
Calcium: 9.3 mg/dL (ref 8.9–10.3)
Chloride: 105 mmol/L (ref 98–111)
Creatinine, Ser: 0.66 mg/dL (ref 0.44–1.00)
GFR, Estimated: >60 mL/min (ref >60)
Glucose, Bld: 344 mg/dL — ABNORMAL HIGH (ref 70–99)
Potassium: 4.3 mmol/L (ref 3.5–5.1)
Sodium: 139 mmol/L (ref 135–145)

## 2021-02-06 LAB — D-DIMER, QUANTITATIVE: D-Dimer, Quant: 0.63 ug/mL-FEU — ABNORMAL HIGH (ref 0.00–0.50)

## 2021-02-06 MED ORDER — IOHEXOL 350 MG/ML SOLN
100.0000 mL | Freq: Once | INTRAVENOUS | Status: AC | PRN
  Administered 2021-02-06: 100 mL via INTRAVENOUS

## 2021-02-06 MED ORDER — SULFAMETHOXAZOLE-TRIMETHOPRIM 800-160 MG PO TABS: 1.0000 | ORAL_TABLET | Freq: Two times a day (BID) | ORAL | 0 refills | Status: AC

## 2021-02-06 NOTE — ED Notes (Signed)
Patient transported to CT 

## 2021-02-06 NOTE — ED Notes (Signed)
Pt given sandwich tray 

## 2021-02-06 NOTE — ED Notes (Signed)
Pt given water at this time 

## 2021-02-06 NOTE — ED Provider Notes (Signed)
[facility] Emergency Department Provider Note   ____________________________________________   Event Date/Time   First MD Initiated Contact with Patient 02/06/21 1550     (approximate)  I have reviewed the triage vital signs and the nursing notes.   HISTORY  Chief Complaint Chest Pain    HPI Danielle Boyer is a 80 y.o. female patient has a history of schizophrenia and dementia and is under hospice care.  It is very difficult to get a history from her as she is constantly wandering off the subject and has even asked the nurse for candy.  She does appear to have chest pain that may be pleuritic.  Does not appear to be severe.  She does not appear to be in any kind of respiratory distress or even short of breath.         Past Medical History:  Diagnosis Date   Alzheimer disease (HCC)    Atrial fibrillation (HCC)    CAD (coronary artery disease)    Dementia (HCC)    Diabetes mellitus without complication (HCC)    type I   GERD (gastroesophageal reflux disease)    Hip fracture (HCC)    Hypertension    Hypokalemia    Hypothyroidism    Schizophrenia (HCC)    Thyroid disease    hypo    Patient Active Problem List   Diagnosis Date Noted   Closed fracture of left olecranon process 02/17/2017   Sepsis (HCC) 09/01/2016   Closed left hip fracture (HCC) 02/29/2016   Mild neurocognitive disorder 06/07/2015   Essential hypertension 06/07/2015   Hearing loss 06/07/2015   Hypothyroidism 06/07/2015   Cardiovascular disease 06/07/2015   Atrial fibrillation (HCC) 06/07/2015   GERD (gastroesophageal reflux disease) 06/07/2015   IBS (irritable bowel syndrome) 06/07/2015   Osteoarthritis 06/07/2015   Paranoid schizophrenia (HCC)    Schizophrenia (HCC) 06/06/2015   Diabetes (HCC) 03/19/2015    Past Surgical History:  Procedure Laterality Date   ABDOMINAL HYSTERECTOMY     CESAREAN SECTION     x3   HIP ARTHROPLASTY Left 03/01/2016   Procedure:  ARTHROPLASTY BIPOLAR HIP (HEMIARTHROPLASTY);  Surgeon: Juanell FairlyKevin Krasinski, MD;  Location: [facility] ORS;  Service: Orthopedics;  Laterality: Left;   ORIF ELBOW FRACTURE Left 02/17/2017   Procedure: OPEN REDUCTION INTERNAL FIXATION (ORIF) ELBOW/OLECRANON FRACTURE;  Surgeon: Deeann SaintMiller, Howard, MD;  Location: [facility] ORS;  Service: Orthopedics;  Laterality: Left;    Prior to Admission medications   Medication Sig Start Date End Date Taking? Authorizing Provider  sulfamethoxazole-trimethoprim (BACTRIM DS) 800-160 MG tablet Take 1 tablet by mouth 2 (two) times daily. 02/06/21  Yes Arnaldo NatalMalinda, Ellisyn Icenhower F, MD  acetaminophen (TYLENOL) 500 MG tablet Take 500 mg by mouth 3 (three) times daily. Take at 8 am, 2 pm, and 6 pm. *Not to exceed 3 gm/24hr*    [provider]  amLODipine (NORVASC) 10 MG tablet Take 10 mg by mouth daily.    [provider]  aspirin 81 MG chewable tablet Chew 324 mg by mouth daily.    [provider]  atorvastatin (LIPITOR) 40 MG tablet Take 1 tablet (40 mg total) by mouth daily at 6 PM. Patient not taking: Reported on 05/21/2017 06/11/15   Jimmy FootmanHernandez-Gonzalez, Andrea, MD  bisacodyl (DULCOLAX) 5 MG EC tablet Take 1 tablet (5 mg total) by mouth daily as needed for moderate constipation. Patient not taking: Reported on 05/21/2017 03/03/16   Shaune Pollackhen, Qing, MD  cephALEXin (KEFLEX) 500 MG capsule Take 1 capsule (500 mg total)  by mouth 3 (three) times daily. 03/23/18   Irean Hong, MD  ciprofloxacin (CIPRO) 500 MG tablet Take 1 tablet (500 mg total) by mouth 2 (two) times daily. Patient not taking: Reported on 02/17/2017 02/12/17   Jene Every, MD  cyanocobalamin 1000 MCG tablet Take 1 tablet (1,000 mcg total) by mouth daily. Patient not taking: Reported on 05/21/2017 06/11/15   Jimmy Footman, MD  docusate sodium (COLACE) 100 MG capsule Take 1 capsule (100 mg total) by mouth 2 (two) times daily. 03/03/16   Shaune Pollack, MD  ergocalciferol (VITAMIN D2) 50000 units capsule Take  50,000 Units once a week by mouth.    [provider]  ferrous sulfate 325 (65 FE) MG tablet Take 1 tablet (325 mg total) by mouth 3 (three) times daily after meals. 03/03/16   Shaune Pollack, MD  HYDROcodone-acetaminophen [facility]) 5-325 MG tablet Take 1 tablet by mouth every 6 (six) hours as needed. 02/18/17   Deeann Saint, MD  insulin detemir (LEVEMIR) 100 UNIT/ML injection Inject 0.2 mLs (20 Units total) into the skin daily. Patient not taking: Reported on 05/21/2017 03/03/16   Shaune Pollack, MD  Insulin Glargine [facility] [place]) Inject 20 Units daily into the skin.    [provider]  levothyroxine (SYNTHROID, LEVOTHROID) 50 MCG tablet Take 1 tablet (50 mcg total) by mouth daily before breakfast. 06/11/15   Jimmy Footman, MD  LORazepam (ATIVAN) 0.5 MG tablet Take 1 tablet (0.5 mg total) by mouth every 6 (six) hours as needed for anxiety. And /or agitation. Patient not taking: Reported on 05/21/2017 09/04/16   Houston Siren, MD  Melatonin 3 MG TABS Take 3 mg by mouth at bedtime.    [provider]  memantine (NAMENDA) 10 MG tablet Take 10 mg by mouth 2 (two) times daily.    [provider]  metFORMIN (GLUCOPHAGE) 500 MG tablet Take 500 mg 2 (two) times daily with a meal by mouth.     [provider]  metoprolol (LOPRESSOR) 50 MG tablet Take 50 mg by mouth 2 (two) times daily.    [provider]  Multiple Vitamin (THEREMS PO) Take 1 tablet daily by mouth.    [provider]  OLANZapine (ZYPREXA) 10 MG tablet Take 1 tablet (10 mg total) by mouth at bedtime. Patient not taking: Reported on 05/21/2017 06/04/15   Clapacs, Jackquline Denmark, MD  omeprazole (PRILOSEC) 20 MG capsule Take 20 mg daily by mouth.    [provider]  ondansetron (ZOFRAN) 4 MG tablet Take 1 tablet (4 mg total) by mouth daily as needed for nausea or vomiting. 12/06/19   Minna Antis, MD  sertraline (ZOLOFT) 25 MG tablet Take 12.5 mg daily by mouth.      [provider]  traZODone (DESYREL) 50 MG tablet Take 12.5 mg 2 (two) times daily by mouth.     [provider]    Allergies Citalopram and Penicillins  Family History  Problem Relation Age of Onset   Breast cancer Sister     Social History Social History   Tobacco Use   Smoking status: Never   Smokeless tobacco: Never  Substance Use Topics   Alcohol use: No   Drug use: No    Review of Systems Unable to obtain in any meaningful fashion  ____________________________________________   PHYSICAL EXAM:  VITAL SIGNS: ED Triage Vitals [02/06/21 1246]  Enc Vitals Group     BP 123/64     Pulse Rate 77  Resp 18     Temp 98.7 F (37.1 C)     Temp Source Oral     SpO2 98 %     Weight 150 lb (68 kg)     Height 5\' 6"  (1.676 m)     Head Circumference      Peak Flow      Pain Score      Pain Loc      Pain Edu?      Excl. in GC?     Constitutional: Alert and oriented to person only Eyes: Conjunctivae are n slightly injected. PER Head: Atraumatic. Nose: No congestion/rhinnorhea. Mouth/Throat: Mucous membranes are moist.  Oropharynx non-erythematous. Neck: No stridor. Cardiovascular: Normal rate, regular rhythm. Grossly normal heart sounds.  Good peripheral circulation. Respiratory: Normal respiratory effort.  No retractions. Lungs CTAB. Gastrointestinal: Soft and nontender. No distention. No abdominal bruits. N Musculoskeletal: No lower extremity tenderness nor edema.  Neurologic:  Normal speech and language. No gross focal neurologic deficits are appreciated.  Skin:  Skin is warm, dry and intact. No rash noted.   ____________________________________________   LABS (all labs ordered are listed, but only abnormal results are displayed)  Labs Reviewed  BASIC METABOLIC PANEL - Abnormal; Notable for the following components:      Result Value   Glucose, Bld 344 (*)    All other components within normal limits  CBC - Abnormal; Notable for the  following components:   RBC 3.57 (*)    HCT 35.3 (*)    All other components within normal limits  CBC WITH DIFFERENTIAL/PLATELET - Abnormal; Notable for the following components:   RBC 3.52 (*)    HCT 34.6 (*)    MCH 34.1 (*)    All other components within normal limits  D-DIMER, QUANTITATIVE - Abnormal; Notable for the following components:   D-Dimer, Quant 0.63 (*)    All other components within normal limits  URINALYSIS, COMPLETE (UACMP) WITH MICROSCOPIC - Abnormal; Notable for the following components:   Color, Urine YELLOW (*)    APPearance CLOUDY (*)    Glucose, UA 150 (*)    Hgb urine dipstick SMALL (*)    Protein, ur 100 (*)    Nitrite POSITIVE (*)    Leukocytes,Ua LARGE (*)    WBC, UA >50 (*)    Bacteria, UA MANY (*)    All other components within normal limits  HEPATIC FUNCTION PANEL  TROPONIN I (HIGH SENSITIVITY)  TROPONIN I (HIGH SENSITIVITY)  TROPONIN I (HIGH SENSITIVITY)  TROPONIN I (HIGH SENSITIVITY)   ____________________________________________  EKG  EKG read interpreted by me shows normal sinus rhythm rate of 76 normal axis low voltage no acute ST-T wave changes are seen ____________________________________________  RADIOLOGY , personally viewed and evaluated these images (plain radiographs) as part of my medical decision making, as well as reviewing the written report by the radiologist.  ED MD interpretation: Chest x-ray read by radiology reviewed by me shows no acute changes  Official radiology report(s): DG Chest 2 View  Result Date: 02/06/2021 CLINICAL DATA:  Chest pain EXAM: CHEST - 2 VIEW COMPARISON:  None. FINDINGS: Stable heart size. Atherosclerotic calcification of the aortic knob. No focal airspace consolidation, pleural effusion, or pneumothorax. Bony demineralization. Chronic deformity of the left humeral head and neck. IMPRESSION: No active cardiopulmonary disease. Electronically Signed   By: 04/08/2021 D.O.   On:  02/06/2021 14:02   CT Angio Chest PE W and/or Wo Contrast  Result Date:  02/06/2021 CLINICAL DATA:  Positive ddimer. Per RN notes: Pt comes into the ED via [facility] from [facility] and is currently under hospice care. Pt has schizophrenia at baseline. Pt c/o nonspecific chest pain with a single episode of vomiting as well as dysuria EXAM: CT ANGIOGRAPHY CHEST WITH CONTRAST TECHNIQUE: Multidetector CT imaging of the chest was performed using the standard protocol during bolus administration of intravenous contrast. Multiplanar CT image reconstructions and MIPs were obtained to evaluate the vascular anatomy. CONTRAST:  OMNIPAQUE IOHEXOL 350 MG/ML SOLN COMPARISON:  Chest x-ray 02/06/2021, CT chest 08/09/2006, CT abdomen pelvis 12/06/2019 FINDINGS: Cardiovascular: Satisfactory opacification of the pulmonary arteries to the segmental level. Unable to evaluate at the subsegmental level due to respiratory motion artifact. No evidence of pulmonary embolism. Normal heart size. No significant pericardial effusion. The thoracic aorta is normal in caliber. Moderate atherosclerotic plaque of the thoracic aorta. At least 3 vessel coronary artery calcifications. Mediastinum/Nodes: No enlarged mediastinal, hilar, or axillary lymph nodes. Redemonstration of a tiny hiatal. Question circumferential distal esophageal wall thickening. Thyroid gland and trachea demonstrate no significant findings. Lungs/Pleura: Bilateral lower lobe subsegmental atelectasis and motion. No focal consolidation. Few scattered pulmonary micronodules. No pulmonary mass. No pleural effusion. No pneumothorax. Upper Abdomen: No acute abnormality. Musculoskeletal: No chest wall abnormality. No acute or significant osseous findings. Review of the MIP images confirms the above findings. IMPRESSION: 1. No central or segmental pulmonary embolus. Unable to evaluate more distally due to motion artifact. 2. No acute intrapulmonary abnormality. 3.  Tiny hiatal hernia. Question circumferential distal esophageal wall thickening. 4. Aortic Atherosclerosis (ICD10-I70.0) as well as at least 3 vessel coronary calcifications. Electronically Signed   By: Tish Frederickson M.D.   On: 02/06/2021 21:14    ____________________________________________   PROCEDURES  Procedure(s) performed (including Critical Care):  Procedures   ____________________________________________   INITIAL IMPRESSION / ASSESSMENT AND PLAN / ED COURSE  Patient with schizophrenia and dementia.  She is not making much sense but seems to indicate that her chest is tender to palpation.  It may also be tender to respiration.  There is no sign of MI on her EKG or troponin.  Chest x-ray looks okay.  D-dimer is minimally elevated but CT angio was negative.  I do not believe this patient has any life-threatening condition at this time.  We will have her return for any further problems and follow-up with her doctor.  We will have her come back as needed.  She does have a UTI which I will treat.              ____________________________________________   FINAL CLINICAL IMPRESSION(S) / ED DIAGNOSES  Final diagnoses:  Nonspecific chest pain  Urinary tract infection with hematuria, site unspecified     ED Discharge Orders          Ordered    sulfamethoxazole-trimethoprim (BACTRIM DS) 800-160 MG tablet  2 times daily        02/06/21 2339             Note:  This document was prepared using Dragon voice recognition software and may include unintentional dictation errors.    Arnaldo Natal, MD 02/07/21 [phone]

## 2021-02-06 NOTE — ED Provider Notes (Signed)
Emergency Medicine Provider Triage Evaluation Note  Danielle Boyer , a 80 y.o. female  was evaluated in triage.  Pt presents from facility for complaint of chest pain.  Patient has known schizophrenia, is under hospice care and known dementia.  She is unable to answer any questions appropriately for me.  Review of Systems   ROS limited by patient's mental status  Physical Exam  BP 123/64 (BP Location: Left Arm)   Pulse 77   Temp 98.7 F (37.1 C) (Oral)   Resp 18   Ht 5\' 6"  (1.676 m)   Wt 68 kg   SpO2 98%   BMI 24.21 kg/m  Gen:   Awake, no distress , disoriented Resp:  Normal effort    Medical Decision Making  Medically screening exam initiated at 12:54 PM.  Appropriate orders placed.  FE OKUBO was informed that the remainder of the evaluation will be completed by another provider, this initial triage assessment does not replace that evaluation, and the importance of remaining in the ED until their evaluation is complete.     [name] Sizer, PA 02/06/21 1256    04/08/21, MD 02/06/21 1344

## 2021-02-06 NOTE — ED Triage Notes (Signed)
Pt comes into the ED via [facility] from [facility] and is currently under hospice care.  Pt has schizophrenia at baseline.  Pt c/o nonspecific chest pain with a single episode of vomiting as well as dysuria.  136/62, 60 HR, NSR.

## 2021-02-06 NOTE — ED Notes (Signed)
Assisted pt with brief, linen, and gown change.  Family at bedside.

## 2021-02-06 NOTE — Discharge Instructions (Addendum)
Please return for increasing pain, fever or shortness of breath.  Patient's troponins and other cardiac studies show no etiology for her pain.  Her chest CT does not show anything either.  She does have a UTI which I will treat with Bactrim 1 twice a day with plenty of fluid.  Please return for any further problems or any worsening or fever.

## 2021-02-06 NOTE — ED Notes (Signed)
Family at bedside. 

## 2021-02-06 NOTE — ED Notes (Signed)
Report received from Kelly, RN

## 2021-02-06 NOTE — ED Notes (Addendum)
Pt cleaned and sheets changed at this time x2 RNS. Yellow fall socks placed on patient.

## 2021-02-06 NOTE — ED Triage Notes (Signed)
See first nurse note, when pt asked if hurting, pt asks " is that candy, do you have candy, I am leaving if you dont have any" Pt disoriented, not answering questions appropriately. NAD noted

## 2021-02-07 NOTE — ED Notes (Addendum)
Spoke with daughter [name] and informed her of pt discharge and confirmed return location to [facility]

## 2021-02-07 NOTE — ED Notes (Signed)
Spoke with Yvone at [phone] and notified her pt will be arriving back to the facility.

## 2021-02-07 NOTE — ED Notes (Signed)
Able to contact Daughter once again to verify [facility] location of pt. Per Daughter, its [facility] on [address].

## 2021-06-15 ENCOUNTER — Emergency Department

## 2021-06-15 ENCOUNTER — Emergency Department
Admission: EM | Admit: 2021-06-15 | Discharge: 2021-06-19 | Disposition: A | Discharge status: Home / Self Care | Attending: Emergency Medicine | Admitting: Emergency Medicine

## 2021-06-15 ENCOUNTER — Other Ambulatory Visit: Payer: Self-pay

## 2021-06-15 DIAGNOSIS — Z20822 Contact with and (suspected) exposure to covid-19: Secondary | ICD-10-CM | POA: Diagnosis not present

## 2021-06-15 DIAGNOSIS — W19XXXA Unspecified fall, initial encounter: Secondary | ICD-10-CM | POA: Diagnosis not present

## 2021-06-15 DIAGNOSIS — G309 Alzheimer's disease, unspecified: Secondary | ICD-10-CM | POA: Diagnosis not present

## 2021-06-15 DIAGNOSIS — Z7984 Long term (current) use of oral hypoglycemic drugs: Secondary | ICD-10-CM | POA: Insufficient documentation

## 2021-06-15 DIAGNOSIS — M25559 Pain in unspecified hip: Secondary | ICD-10-CM | POA: Insufficient documentation

## 2021-06-15 DIAGNOSIS — Z7982 Long term (current) use of aspirin: Secondary | ICD-10-CM | POA: Insufficient documentation

## 2021-06-15 DIAGNOSIS — E119 Type 2 diabetes mellitus without complications: Secondary | ICD-10-CM | POA: Insufficient documentation

## 2021-06-15 DIAGNOSIS — Z794 Long term (current) use of insulin: Secondary | ICD-10-CM | POA: Diagnosis not present

## 2021-06-15 DIAGNOSIS — E039 Hypothyroidism, unspecified: Secondary | ICD-10-CM | POA: Diagnosis not present

## 2021-06-15 DIAGNOSIS — I2510 Atherosclerotic heart disease of native coronary artery without angina pectoris: Secondary | ICD-10-CM | POA: Insufficient documentation

## 2021-06-15 DIAGNOSIS — Z79899 Other long term (current) drug therapy: Secondary | ICD-10-CM | POA: Insufficient documentation

## 2021-06-15 DIAGNOSIS — Y92129 Unspecified place in nursing home as the place of occurrence of the external cause: Secondary | ICD-10-CM | POA: Diagnosis not present

## 2021-06-15 DIAGNOSIS — Z96642 Presence of left artificial hip joint: Secondary | ICD-10-CM | POA: Diagnosis not present

## 2021-06-15 DIAGNOSIS — I10 Essential (primary) hypertension: Secondary | ICD-10-CM | POA: Insufficient documentation

## 2021-06-15 LAB — TYPE AND SCREEN
ABO/RH(D): A POS
Antibody Screen: NEGATIVE

## 2021-06-15 LAB — CBC WITH DIFFERENTIAL/PLATELET
Abs Immature Granulocytes: 0.02 10*3/uL (ref 0.00–0.07)
Basophils Absolute: 0 10*3/uL (ref 0.0–0.1)
Basophils Relative: 0 %
Eosinophils Absolute: 0.2 10*3/uL (ref 0.0–0.5)
Eosinophils Relative: 2 %
HCT: 37.9 % (ref 36.0–46.0)
Hemoglobin: 12.4 g/dL (ref 12.0–15.0)
Immature Granulocytes: 0 %
Lymphocytes Relative: 24 %
Lymphs Abs: 1.8 10*3/uL (ref 0.7–4.0)
MCH: 32.5 pg (ref 26.0–34.0)
MCHC: 32.7 g/dL (ref 30.0–36.0)
MCV: 99.5 fL (ref 80.0–100.0)
Monocytes Absolute: 0.4 10*3/uL (ref 0.1–1.0)
Monocytes Relative: 5 %
Neutro Abs: 5.3 10*3/uL (ref 1.7–7.7)
Neutrophils Relative %: 69 %
Platelets: 253 10*3/uL (ref 150–400)
RBC: 3.81 MIL/uL — ABNORMAL LOW (ref 3.87–5.11)
RDW: 13 % (ref 11.5–15.5)
WBC: 7.7 10*3/uL (ref 4.0–10.5)
nRBC: 0 % (ref 0.0–0.2)

## 2021-06-15 LAB — BASIC METABOLIC PANEL
Anion gap: 8 (ref 5–15)
BUN: 14 mg/dL (ref 8–23)
CO2: 27 mmol/L (ref 22–32)
Calcium: 9 mg/dL (ref 8.9–10.3)
Chloride: 105 mmol/L (ref 98–111)
Creatinine, Ser: 0.64 mg/dL (ref 0.44–1.00)
GFR, Estimated: >60 mL/min (ref >60)
Glucose, Bld: 286 mg/dL — ABNORMAL HIGH (ref 70–99)
Potassium: 3.8 mmol/L (ref 3.5–5.1)
Sodium: 140 mmol/L (ref 135–145)

## 2021-06-15 LAB — RESP PANEL BY RT-PCR (FLU A&B, COVID) ARPGX2
Influenza A by PCR: NEGATIVE
Influenza B by PCR: NEGATIVE
SARS Coronavirus 2 by RT PCR: NEGATIVE

## 2021-06-15 NOTE — ED Notes (Signed)
EMS took pt before could obtain discharge vital signs and pain level

## 2021-06-15 NOTE — ED Provider Notes (Signed)
[facility] Emergency Department Provider Note   ____________________________________________    I have reviewed the triage vital signs and the nursing notes.   HISTORY  Chief Complaint Fall  History limited by Alzheimer's disease   HPI Danielle Boyer is a 80 y.o. female with a history of Alzheimer's disease who presents after a fall at her facility.  This was an unwitnessed fall.  She apparently was complaining of hip pain, was brought in by EMS, no further history is available.  Past Medical History:  Diagnosis Date   Alzheimer disease (HCC)    Atrial fibrillation (HCC)    CAD (coronary artery disease)    Dementia (HCC)    Diabetes mellitus without complication (HCC)    type I   GERD (gastroesophageal reflux disease)    Hip fracture (HCC)    Hypertension    Hypokalemia    Hypothyroidism    Schizophrenia (HCC)    Thyroid disease    hypo    Patient Active Problem List   Diagnosis Date Noted   Closed fracture of left olecranon process 02/17/2017   Sepsis (HCC) 09/01/2016   Closed left hip fracture (HCC) 02/29/2016   Mild neurocognitive disorder 06/07/2015   Essential hypertension 06/07/2015   Hearing loss 06/07/2015   Hypothyroidism 06/07/2015   Cardiovascular disease 06/07/2015   Atrial fibrillation (HCC) 06/07/2015   GERD (gastroesophageal reflux disease) 06/07/2015   IBS (irritable bowel syndrome) 06/07/2015   Osteoarthritis 06/07/2015   Paranoid schizophrenia (HCC)    Schizophrenia (HCC) 06/06/2015   Diabetes (HCC) 03/19/2015    Past Surgical History:  Procedure Laterality Date   ABDOMINAL HYSTERECTOMY     CESAREAN SECTION     x3   HIP ARTHROPLASTY Left 03/01/2016   Procedure: ARTHROPLASTY BIPOLAR HIP (HEMIARTHROPLASTY);  Surgeon: Juanell Fairly, MD;  Location: [facility] ORS;  Service: Orthopedics;  Laterality: Left;   ORIF ELBOW FRACTURE Left 02/17/2017   Procedure: OPEN REDUCTION INTERNAL FIXATION (ORIF) ELBOW/OLECRANON  FRACTURE;  Surgeon: Deeann Saint, MD;  Location: [facility] ORS;  Service: Orthopedics;  Laterality: Left;    Prior to Admission medications   Medication Sig Start Date End Date Taking? Authorizing Provider  acetaminophen (TYLENOL) 500 MG tablet Take 500 mg by mouth 3 (three) times daily. Take at 8 am, 2 pm, and 6 pm. *Not to exceed 3 gm/24hr*    [provider]  amLODipine (NORVASC) 10 MG tablet Take 10 mg by mouth daily.    [provider]  aspirin 81 MG chewable tablet Chew 324 mg by mouth daily.    [provider]  atorvastatin (LIPITOR) 40 MG tablet Take 1 tablet (40 mg total) by mouth daily at 6 PM. Patient not taking: Reported on 05/21/2017 06/11/15   Jimmy Footman, MD  bisacodyl (DULCOLAX) 5 MG EC tablet Take 1 tablet (5 mg total) by mouth daily as needed for moderate constipation. Patient not taking: Reported on 05/21/2017 03/03/16   Shaune Pollack, MD  cephALEXin (KEFLEX) 500 MG capsule Take 1 capsule (500 mg total) by mouth 3 (three) times daily. 03/23/18   Irean Hong, MD  ciprofloxacin (CIPRO) 500 MG tablet Take 1 tablet (500 mg total) by mouth 2 (two) times daily. Patient not taking: Reported on 02/17/2017 02/12/17   Jene Every, MD  cyanocobalamin 1000 MCG tablet Take 1 tablet (1,000 mcg total) by mouth daily. Patient not taking: Reported on 05/21/2017 06/11/15   Jimmy Footman, MD  docusate sodium (COLACE) 100 MG capsule Take 1 capsule (100 mg  total) by mouth 2 (two) times daily. 03/03/16   Shaune Pollack, MD  ergocalciferol (VITAMIN D2) 50000 units capsule Take 50,000 Units once a week by mouth.    [provider]  ferrous sulfate 325 (65 FE) MG tablet Take 1 tablet (325 mg total) by mouth 3 (three) times daily after meals. 03/03/16   Shaune Pollack, MD  HYDROcodone-acetaminophen [facility]) 5-325 MG tablet Take 1 tablet by mouth every 6 (six) hours as needed. 02/18/17   Deeann Saint, MD  insulin detemir (LEVEMIR) 100 UNIT/ML injection  Inject 0.2 mLs (20 Units total) into the skin daily. Patient not taking: Reported on 05/21/2017 03/03/16   Shaune Pollack, MD  Insulin Glargine [facility]) Inject 20 Units daily into the skin.    [provider]  levothyroxine (SYNTHROID, LEVOTHROID) 50 MCG tablet Take 1 tablet (50 mcg total) by mouth daily before breakfast. 06/11/15   Jimmy Footman, MD  LORazepam (ATIVAN) 0.5 MG tablet Take 1 tablet (0.5 mg total) by mouth every 6 (six) hours as needed for anxiety. And /or agitation. Patient not taking: Reported on 05/21/2017 09/04/16   Houston Siren, MD  Melatonin 3 MG TABS Take 3 mg by mouth at bedtime.    [provider]  memantine (NAMENDA) 10 MG tablet Take 10 mg by mouth 2 (two) times daily.    [provider]  metFORMIN (GLUCOPHAGE) 500 MG tablet Take 500 mg 2 (two) times daily with a meal by mouth.     [provider]  metoprolol (LOPRESSOR) 50 MG tablet Take 50 mg by mouth 2 (two) times daily.    [provider]  Multiple Vitamin (THEREMS PO) Take 1 tablet daily by mouth.    [provider]  OLANZapine (ZYPREXA) 10 MG tablet Take 1 tablet (10 mg total) by mouth at bedtime. Patient not taking: Reported on 05/21/2017 06/04/15   Clapacs, Jackquline Denmark, MD  omeprazole (PRILOSEC) 20 MG capsule Take 20 mg daily by mouth.    [provider]  ondansetron (ZOFRAN) 4 MG tablet Take 1 tablet (4 mg total) by mouth daily as needed for nausea or vomiting. 12/06/19   Minna Antis, MD  sertraline (ZOLOFT) 25 MG tablet Take 12.5 mg daily by mouth.     [provider]  sulfamethoxazole-trimethoprim (BACTRIM DS) 800-160 MG tablet Take 1 tablet by mouth 2 (two) times daily. 02/06/21   Arnaldo Natal, MD  traZODone (DESYREL) 50 MG tablet Take 12.5 mg 2 (two) times daily by mouth.     [provider]     Allergies Citalopram and Penicillins  Family History  Problem Relation Age of Onset   Breast cancer Sister      Social History Social History   Tobacco Use   Smoking status: Never   Smokeless tobacco: Never  Substance Use Topics   Alcohol use: No   Drug use: No    Review of Systems    ____________________________________________   PHYSICAL EXAM:  VITAL SIGNS: ED Triage Vitals [06/15/21 1646]  Enc Vitals Group     BP 138/66     Pulse Rate 76     Resp 18     Temp 98.2 F (36.8 C)     Temp src      SpO2 100 %     Weight      Height      Head Circumference      Peak Flow      Pain Score  Pain Loc      Pain Edu?      Excl. in GC?     Constitutional: Alert, disoriented, no acute distress Eyes: Conjunctivae are normal.  Head: Atraumatic.  No hematoma Nose: No congestion/rhinnorhea. Mouth/Throat: Mucous membranes are moist.   Neck:  Painless ROM, no pain with axial load Cardiovascular:Grossly normal heart sounds.  Good peripheral circulation.  No chest wall tenderness to palpation Respiratory: Normal respiratory effort.  No retractions. Lungs CTAB. Gastrointestinal: Soft and nontender. No distention.   Musculoskeletal: No vertebral tenderness palpation.  No pain with axial load on both hips, patient is moving both lower extremities well.  Normal range of motion of the upper extremities without pain. Neurologic:  Normal speech and language. No gross focal neurologic deficits are appreciated.  Skin:  Skin is warm, dry and intact. No rash noted.   ____________________________________________   LABS (all labs ordered are listed, but only abnormal results are displayed)  Labs Reviewed  BASIC METABOLIC PANEL - Abnormal; Notable for the following components:      Result Value   Glucose, Bld 286 (*)    All other components within normal limits  CBC WITH DIFFERENTIAL/PLATELET - Abnormal; Notable for the following components:   RBC 3.81 (*)    All other components within normal limits  RESP PANEL BY RT-PCR (FLU A&B, COVID) ARPGX2  TYPE AND SCREEN    ____________________________________________  EKG  ED ECG REPORT I, Jene Every, the attending physician, personally viewed and interpreted this ECG.  Date: 06/15/2021  Rhythm: normal sinus rhythm QRS Axis: normal Intervals: normal ST/T Wave abnormalities: normal Narrative Interpretation: no evidence of acute ischemia  ____________________________________________  RADIOLOGY  Chest x-ray reviewed by me, no acute normality Hip x-ray reviewed by me without acute abnormality ____________________________________________   PROCEDURES  Procedure(s) performed: No  Procedures   Critical Care performed: No ____________________________________________   INITIAL IMPRESSION / ASSESSMENT AND PLAN / ED COURSE  Pertinent labs & imaging results that were available during my care of the patient were reviewed by me and considered in my medical decision making (see chart for details).   Patient presents after unwitnessed fall as detailed above.  Exam is overall reassuring, imaging without evidence of acute fracture or intracranial injury.  Lab work is reassuring, mildly elevated glucose is the only abnormality.  Appropriate for discharge with outpatient follow-up    ____________________________________________   FINAL CLINICAL IMPRESSION(S) / ED DIAGNOSES  Final diagnoses:  Fall, initial encounter        Note:  This document was prepared using Dragon voice recognition software and may include unintentional dictation errors.    Jene Every, MD 06/15/21 [phone]

## 2021-06-15 NOTE — ED Triage Notes (Signed)
Pt in via EMS from [facility]. One staff reports pt fell and has left hip pain, one RN reports did not fall and has pain to left hip. Pt with hx opf alzheimer's.

## 2021-06-15 NOTE — ED Provider Notes (Signed)
Injuries from fall Emergency Medicine Provider Triage Evaluation Note  Danielle Boyer , a 80 y.o.female,  was evaluated in triage.  Pt complains of injuries from fall.  Patient was brought in by EMS complaining of hip pain.  Per EMS, patient suffered from a fall at her facility.  This occurred today, but this was not a witnessed fall.  Unsure if she hit her head.  Patient has a history of Alzheimer's and is not able to communicate coherently.    Review of Systems  Positive: Left hip pain. Negative: Denies fever, chest pain, vomiting  Physical Exam   Vitals:   06/15/21 1646  BP: 138/66  Pulse: 76  Resp: 18  Temp: 98.2 F (36.8 C)  SpO2: 100%   Gen:   Awake, appears uncomfortable Resp:  Normal effort  MSK:      Patient appears to be in pain when manipulating both left and right lower extremities.  Medical Decision Making  Given the patient's initial medical screening exam, the following diagnostic evaluation has been ordered. The patient will be placed in the appropriate treatment space, once one is available, to complete the evaluation and treatment. I have discussed the plan of care with the patient and I have advised the patient that an ED physician or mid-level practitioner will reevaluate their condition after the test results have been received, as the results may give them additional insight into the type of treatment they may need.    Diagnostics: Head CT, cervical spine CT, x-rays of left/right hip, labs, respiratory panel.  Treatments: none immediately   Varney Daily, [place] 06/15/21 1650    Merwyn Katos, MD 06/15/21 1721

## 2021-06-15 NOTE — Discharge Instructions (Addendum)
Patient's workup today was reassuring

## 2021-06-15 NOTE — ED Notes (Signed)
[facility] to transport to [facility]. [address]

## 2021-06-15 NOTE — ED Triage Notes (Signed)
Pt comes via EMS with c/o left hip pain after fall. Unsure if pt had witnessed or not fall. Pt has hx of Alzheimer.  Pt states some hip pain.

## 2021-08-06 DEATH — deceased
